# Patient Record
Sex: Male | Born: 1935 | Race: White | Hispanic: No | Marital: Married | Smoking: Former smoker
Health system: Southern US, Community
[De-identification: ages and names within clinical notes are randomized; demographics above are authoritative.]

## PROBLEM LIST (undated history)

## (undated) DIAGNOSIS — I803 Phlebitis and thrombophlebitis of lower extremities, unspecified: Secondary | ICD-10-CM

## (undated) DIAGNOSIS — I442 Atrioventricular block, complete: Secondary | ICD-10-CM

## (undated) DIAGNOSIS — Z86010 Personal history of colonic polyps: Secondary | ICD-10-CM

## (undated) DIAGNOSIS — N400 Enlarged prostate without lower urinary tract symptoms: Secondary | ICD-10-CM

## (undated) DIAGNOSIS — E785 Hyperlipidemia, unspecified: Secondary | ICD-10-CM

## (undated) DIAGNOSIS — I472 Ventricular tachycardia: Secondary | ICD-10-CM

## (undated) DIAGNOSIS — Z950 Presence of cardiac pacemaker: Secondary | ICD-10-CM

## (undated) DIAGNOSIS — I2510 Atherosclerotic heart disease of native coronary artery without angina pectoris: Secondary | ICD-10-CM

## (undated) DIAGNOSIS — M1990 Unspecified osteoarthritis, unspecified site: Secondary | ICD-10-CM

## (undated) DIAGNOSIS — T4145XA Adverse effect of unspecified anesthetic, initial encounter: Secondary | ICD-10-CM

## (undated) DIAGNOSIS — C801 Malignant (primary) neoplasm, unspecified: Secondary | ICD-10-CM

## (undated) DIAGNOSIS — R5381 Other malaise: Secondary | ICD-10-CM

## (undated) DIAGNOSIS — J189 Pneumonia, unspecified organism: Secondary | ICD-10-CM

## (undated) DIAGNOSIS — R55 Syncope and collapse: Secondary | ICD-10-CM

## (undated) DIAGNOSIS — Z8719 Personal history of other diseases of the digestive system: Secondary | ICD-10-CM

## (undated) DIAGNOSIS — M353 Polymyalgia rheumatica: Secondary | ICD-10-CM

## (undated) DIAGNOSIS — I10 Essential (primary) hypertension: Secondary | ICD-10-CM

## (undated) DIAGNOSIS — R5383 Other fatigue: Secondary | ICD-10-CM

## (undated) DIAGNOSIS — Z8679 Personal history of other diseases of the circulatory system: Secondary | ICD-10-CM

## (undated) DIAGNOSIS — K219 Gastro-esophageal reflux disease without esophagitis: Secondary | ICD-10-CM

## (undated) DIAGNOSIS — J449 Chronic obstructive pulmonary disease, unspecified: Secondary | ICD-10-CM

## (undated) HISTORY — PX: LUMBAR LAMINECTOMY: SHX95

## (undated) HISTORY — DX: Benign prostatic hyperplasia without lower urinary tract symptoms: N40.0

## (undated) HISTORY — DX: Personal history of colonic polyps: Z86.010

## (undated) HISTORY — DX: Personal history of other diseases of the circulatory system: Z86.79

## (undated) HISTORY — DX: Chronic obstructive pulmonary disease, unspecified: J44.9

## (undated) HISTORY — DX: Hyperlipidemia, unspecified: E78.5

## (undated) HISTORY — PX: BACK SURGERY: SHX140

## (undated) HISTORY — PX: CAROTID ENDARTERECTOMY: SUR193

## (undated) HISTORY — DX: Ventricular tachycardia: I47.2

## (undated) HISTORY — DX: Atherosclerotic heart disease of native coronary artery without angina pectoris: I25.10

## (undated) HISTORY — PX: CORONARY ANGIOPLASTY WITH STENT PLACEMENT: SHX49

## (undated) HISTORY — DX: Phlebitis and thrombophlebitis of lower extremities, unspecified: I80.3

## (undated) HISTORY — DX: Other malaise: R53.81

## (undated) HISTORY — DX: Gastro-esophageal reflux disease without esophagitis: K21.9

## (undated) HISTORY — PX: TRANSURETHRAL RESECTION OF PROSTATE: SHX73

## (undated) HISTORY — DX: Polymyalgia rheumatica: M35.3

## (undated) HISTORY — PX: CATARACT EXTRACTION W/ INTRAOCULAR LENS  IMPLANT, BILATERAL: SHX1307

## (undated) HISTORY — DX: Atrioventricular block, complete: I44.2

## (undated) HISTORY — DX: Syncope and collapse: R55

## (undated) HISTORY — DX: Presence of cardiac pacemaker: Z95.0

## (undated) HISTORY — DX: Personal history of other diseases of the digestive system: Z87.19

## (undated) HISTORY — DX: Other fatigue: R53.83

## (undated) HISTORY — DX: Essential (primary) hypertension: I10

---

## 2000-06-02 ENCOUNTER — Encounter: Payer: Self-pay | Admitting: Orthopedic Surgery

## 2000-06-02 ENCOUNTER — Encounter: Admission: RE | Admit: 2000-06-02 | Discharge: 2000-06-02 | Payer: Self-pay | Admitting: Orthopedic Surgery

## 2000-06-07 ENCOUNTER — Ambulatory Visit (HOSPITAL_BASED_OUTPATIENT_CLINIC_OR_DEPARTMENT_OTHER): Admission: RE | Admit: 2000-06-07 | Discharge: 2000-06-08 | Payer: Self-pay | Admitting: Orthopedic Surgery

## 2002-08-09 ENCOUNTER — Encounter: Payer: Self-pay | Admitting: Neurosurgery

## 2002-08-14 ENCOUNTER — Ambulatory Visit (HOSPITAL_COMMUNITY): Admission: RE | Admit: 2002-08-14 | Discharge: 2002-08-14 | Payer: Self-pay | Admitting: Neurosurgery

## 2002-08-14 ENCOUNTER — Encounter: Payer: Self-pay | Admitting: Neurosurgery

## 2004-03-24 ENCOUNTER — Ambulatory Visit: Payer: Self-pay | Admitting: Internal Medicine

## 2004-06-11 ENCOUNTER — Ambulatory Visit: Payer: Self-pay | Admitting: Internal Medicine

## 2004-08-13 ENCOUNTER — Ambulatory Visit: Payer: Self-pay | Admitting: Internal Medicine

## 2005-02-09 ENCOUNTER — Ambulatory Visit: Payer: Self-pay | Admitting: Internal Medicine

## 2005-08-17 ENCOUNTER — Ambulatory Visit: Payer: Self-pay | Admitting: Internal Medicine

## 2005-10-12 ENCOUNTER — Ambulatory Visit: Payer: Self-pay | Admitting: Internal Medicine

## 2006-01-14 ENCOUNTER — Ambulatory Visit: Payer: Self-pay | Admitting: Internal Medicine

## 2006-01-21 ENCOUNTER — Ambulatory Visit: Payer: Self-pay | Admitting: Internal Medicine

## 2006-07-19 ENCOUNTER — Ambulatory Visit: Payer: Self-pay | Admitting: Internal Medicine

## 2006-07-22 ENCOUNTER — Ambulatory Visit: Payer: Self-pay

## 2006-07-22 ENCOUNTER — Ambulatory Visit: Payer: Self-pay | Admitting: Cardiology

## 2006-08-02 ENCOUNTER — Ambulatory Visit: Payer: Self-pay | Admitting: Internal Medicine

## 2006-08-04 ENCOUNTER — Ambulatory Visit: Payer: Self-pay | Admitting: *Deleted

## 2006-08-05 ENCOUNTER — Encounter: Admission: RE | Admit: 2006-08-05 | Discharge: 2006-08-05 | Payer: Self-pay | Admitting: *Deleted

## 2006-08-09 ENCOUNTER — Encounter (INDEPENDENT_AMBULATORY_CARE_PROVIDER_SITE_OTHER): Payer: Self-pay | Admitting: *Deleted

## 2006-08-09 ENCOUNTER — Ambulatory Visit: Payer: Self-pay | Admitting: *Deleted

## 2006-08-09 ENCOUNTER — Encounter: Payer: Self-pay | Admitting: Internal Medicine

## 2006-08-09 ENCOUNTER — Inpatient Hospital Stay (HOSPITAL_COMMUNITY): Admission: RE | Admit: 2006-08-09 | Discharge: 2006-08-11 | Payer: Self-pay | Admitting: *Deleted

## 2006-08-09 DIAGNOSIS — N400 Enlarged prostate without lower urinary tract symptoms: Secondary | ICD-10-CM

## 2006-08-09 DIAGNOSIS — K219 Gastro-esophageal reflux disease without esophagitis: Secondary | ICD-10-CM

## 2006-08-09 DIAGNOSIS — M353 Polymyalgia rheumatica: Secondary | ICD-10-CM

## 2006-08-09 DIAGNOSIS — Z860100 Personal history of colon polyps, unspecified: Secondary | ICD-10-CM

## 2006-08-09 DIAGNOSIS — E785 Hyperlipidemia, unspecified: Secondary | ICD-10-CM

## 2006-08-09 DIAGNOSIS — Z8679 Personal history of other diseases of the circulatory system: Secondary | ICD-10-CM

## 2006-08-09 DIAGNOSIS — Z950 Presence of cardiac pacemaker: Secondary | ICD-10-CM

## 2006-08-09 DIAGNOSIS — J309 Allergic rhinitis, unspecified: Secondary | ICD-10-CM | POA: Insufficient documentation

## 2006-08-09 HISTORY — DX: Personal history of colonic polyps: Z86.010

## 2006-08-09 HISTORY — DX: Polymyalgia rheumatica: M35.3

## 2006-08-09 HISTORY — DX: Gastro-esophageal reflux disease without esophagitis: K21.9

## 2006-08-09 HISTORY — DX: Presence of cardiac pacemaker: Z95.0

## 2006-08-09 HISTORY — DX: Personal history of other diseases of the circulatory system: Z86.79

## 2006-08-09 HISTORY — DX: Benign prostatic hyperplasia without lower urinary tract symptoms: N40.0

## 2006-08-09 HISTORY — DX: Hyperlipidemia, unspecified: E78.5

## 2006-08-09 HISTORY — DX: Personal history of colon polyps, unspecified: Z86.0100

## 2006-08-25 ENCOUNTER — Ambulatory Visit: Payer: Self-pay | Admitting: *Deleted

## 2006-11-25 ENCOUNTER — Ambulatory Visit: Payer: Self-pay | Admitting: Internal Medicine

## 2006-11-25 DIAGNOSIS — I6990 Unspecified sequelae of unspecified cerebrovascular disease: Secondary | ICD-10-CM | POA: Insufficient documentation

## 2007-01-31 ENCOUNTER — Ambulatory Visit: Payer: Self-pay | Admitting: Internal Medicine

## 2007-02-16 ENCOUNTER — Encounter: Payer: Self-pay | Admitting: Internal Medicine

## 2007-02-16 ENCOUNTER — Ambulatory Visit: Payer: Self-pay | Admitting: *Deleted

## 2007-05-06 ENCOUNTER — Telehealth: Payer: Self-pay | Admitting: Internal Medicine

## 2007-05-06 ENCOUNTER — Ambulatory Visit: Payer: Self-pay | Admitting: Internal Medicine

## 2007-05-06 DIAGNOSIS — K5289 Other specified noninfective gastroenteritis and colitis: Secondary | ICD-10-CM

## 2007-05-22 ENCOUNTER — Ambulatory Visit: Payer: Self-pay | Admitting: Internal Medicine

## 2007-05-22 DIAGNOSIS — R413 Other amnesia: Secondary | ICD-10-CM | POA: Insufficient documentation

## 2007-06-08 ENCOUNTER — Ambulatory Visit: Payer: Self-pay | Admitting: Internal Medicine

## 2007-06-08 DIAGNOSIS — I803 Phlebitis and thrombophlebitis of lower extremities, unspecified: Secondary | ICD-10-CM

## 2007-06-08 HISTORY — DX: Phlebitis and thrombophlebitis of lower extremities, unspecified: I80.3

## 2007-06-09 ENCOUNTER — Telehealth: Payer: Self-pay | Admitting: Internal Medicine

## 2007-06-09 ENCOUNTER — Encounter: Payer: Self-pay | Admitting: Internal Medicine

## 2007-06-09 ENCOUNTER — Ambulatory Visit: Payer: Self-pay

## 2007-06-12 ENCOUNTER — Ambulatory Visit: Payer: Self-pay | Admitting: Internal Medicine

## 2007-06-26 ENCOUNTER — Ambulatory Visit: Payer: Self-pay | Admitting: Gastroenterology

## 2007-07-17 ENCOUNTER — Ambulatory Visit: Payer: Self-pay | Admitting: Gastroenterology

## 2007-07-17 ENCOUNTER — Encounter: Payer: Self-pay | Admitting: Gastroenterology

## 2007-07-17 ENCOUNTER — Encounter: Payer: Self-pay | Admitting: Internal Medicine

## 2007-07-19 ENCOUNTER — Encounter: Payer: Self-pay | Admitting: Gastroenterology

## 2007-08-17 ENCOUNTER — Ambulatory Visit: Payer: Self-pay | Admitting: *Deleted

## 2007-08-23 ENCOUNTER — Encounter: Payer: Self-pay | Admitting: Internal Medicine

## 2007-08-24 ENCOUNTER — Ambulatory Visit: Payer: Self-pay | Admitting: Internal Medicine

## 2007-09-01 LAB — CONVERTED CEMR LAB
Albumin: 4.1 g/dL (ref 3.5–5.2)
Basophils Absolute: 0 10*3/uL (ref 0.0–0.1)
Bilirubin, Direct: 0.1 mg/dL (ref 0.0–0.3)
CO2: 31 meq/L (ref 19–32)
Chloride: 103 meq/L (ref 96–112)
Creatinine, Ser: 1 mg/dL (ref 0.4–1.5)
Eosinophils Absolute: 0.3 10*3/uL (ref 0.0–0.7)
GFR calc Af Amer: 92 mL/min
HCT: 38.5 % — ABNORMAL LOW (ref 39.0–52.0)
Monocytes Relative: 4.7 % (ref 3.0–12.0)
Neutro Abs: 3.3 10*3/uL (ref 1.4–7.7)
Neutrophils Relative %: 65.5 % (ref 43.0–77.0)
Platelets: 163 10*3/uL (ref 150–400)
Potassium: 4.4 meq/L (ref 3.5–5.1)
RBC: 4.2 M/uL — ABNORMAL LOW (ref 4.22–5.81)
RDW: 12.8 % (ref 11.5–14.6)
TSH: 1.41 microintl units/mL (ref 0.35–5.50)
Total CHOL/HDL Ratio: 4.2
Total Protein: 7.4 g/dL (ref 6.0–8.3)
Triglycerides: 85 mg/dL (ref 0–149)

## 2007-12-26 ENCOUNTER — Ambulatory Visit: Payer: Self-pay | Admitting: Internal Medicine

## 2007-12-26 ENCOUNTER — Inpatient Hospital Stay (HOSPITAL_COMMUNITY): Admission: EM | Admit: 2007-12-26 | Discharge: 2007-12-30 | Payer: Self-pay | Admitting: Emergency Medicine

## 2007-12-27 ENCOUNTER — Encounter: Payer: Self-pay | Admitting: Internal Medicine

## 2007-12-28 ENCOUNTER — Encounter: Payer: Self-pay | Admitting: Internal Medicine

## 2007-12-30 ENCOUNTER — Encounter: Payer: Self-pay | Admitting: Internal Medicine

## 2008-01-03 ENCOUNTER — Ambulatory Visit: Payer: Self-pay | Admitting: Cardiology

## 2008-01-12 ENCOUNTER — Ambulatory Visit: Payer: Self-pay | Admitting: Internal Medicine

## 2008-01-12 DIAGNOSIS — I472 Ventricular tachycardia, unspecified: Secondary | ICD-10-CM | POA: Insufficient documentation

## 2008-01-12 DIAGNOSIS — I2510 Atherosclerotic heart disease of native coronary artery without angina pectoris: Secondary | ICD-10-CM

## 2008-01-12 DIAGNOSIS — I4729 Other ventricular tachycardia: Secondary | ICD-10-CM

## 2008-01-12 HISTORY — DX: Atherosclerotic heart disease of native coronary artery without angina pectoris: I25.10

## 2008-01-12 HISTORY — DX: Ventricular tachycardia: I47.2

## 2008-01-12 HISTORY — DX: Ventricular tachycardia, unspecified: I47.20

## 2008-01-12 HISTORY — DX: Other ventricular tachycardia: I47.29

## 2008-01-18 ENCOUNTER — Ambulatory Visit: Payer: Self-pay | Admitting: Cardiology

## 2008-01-25 ENCOUNTER — Ambulatory Visit: Payer: Self-pay | Admitting: Internal Medicine

## 2008-01-25 DIAGNOSIS — R42 Dizziness and giddiness: Secondary | ICD-10-CM

## 2008-01-25 LAB — CONVERTED CEMR LAB
Bilirubin Urine: NEGATIVE
Blood Glucose, Fingerstick: 134
Glucose, Urine, Semiquant: NEGATIVE
Ketones, urine, test strip: NEGATIVE
Protein, U semiquant: 100
Urobilinogen, UA: 0.2
pH: 5

## 2008-01-31 ENCOUNTER — Telehealth: Payer: Self-pay | Admitting: Internal Medicine

## 2008-02-01 ENCOUNTER — Ambulatory Visit: Payer: Self-pay | Admitting: Internal Medicine

## 2008-02-01 DIAGNOSIS — Z8719 Personal history of other diseases of the digestive system: Secondary | ICD-10-CM

## 2008-02-01 HISTORY — DX: Personal history of other diseases of the digestive system: Z87.19

## 2008-02-14 ENCOUNTER — Telehealth: Payer: Self-pay | Admitting: Internal Medicine

## 2008-02-22 ENCOUNTER — Ambulatory Visit: Payer: Self-pay | Admitting: Internal Medicine

## 2008-05-21 ENCOUNTER — Ambulatory Visit: Payer: Self-pay | Admitting: Internal Medicine

## 2008-08-13 DIAGNOSIS — I10 Essential (primary) hypertension: Secondary | ICD-10-CM

## 2008-08-13 HISTORY — DX: Essential (primary) hypertension: I10

## 2008-08-14 ENCOUNTER — Ambulatory Visit: Payer: Self-pay | Admitting: Cardiology

## 2008-08-22 ENCOUNTER — Ambulatory Visit: Payer: Self-pay | Admitting: *Deleted

## 2008-09-04 ENCOUNTER — Ambulatory Visit: Payer: Self-pay | Admitting: Internal Medicine

## 2008-09-04 LAB — CONVERTED CEMR LAB
ALT: 15 units/L (ref 0–53)
Albumin: 3.7 g/dL (ref 3.5–5.2)
Alkaline Phosphatase: 41 units/L (ref 39–117)
Bilirubin, Direct: 0 mg/dL (ref 0.0–0.3)
CO2: 30 meq/L (ref 19–32)
Eosinophils Absolute: 0.2 10*3/uL (ref 0.0–0.7)
Glucose, Bld: 104 mg/dL — ABNORMAL HIGH (ref 70–99)
LDL Cholesterol: 118 mg/dL — ABNORMAL HIGH (ref 0–99)
MCHC: 34.1 g/dL (ref 30.0–36.0)
MCV: 92.3 fL (ref 78.0–100.0)
Potassium: 4 meq/L (ref 3.5–5.1)
RBC: 3.99 M/uL — ABNORMAL LOW (ref 4.22–5.81)
Sodium: 145 meq/L (ref 135–145)
Total Bilirubin: 1.1 mg/dL (ref 0.3–1.2)
Total CHOL/HDL Ratio: 4
Total Protein: 6.6 g/dL (ref 6.0–8.3)
Triglycerides: 113 mg/dL (ref 0.0–149.0)
VLDL: 22.6 mg/dL (ref 0.0–40.0)

## 2008-09-09 ENCOUNTER — Telehealth: Payer: Self-pay | Admitting: Internal Medicine

## 2008-11-21 ENCOUNTER — Ambulatory Visit: Payer: Self-pay | Admitting: Family Medicine

## 2008-11-21 DIAGNOSIS — R5381 Other malaise: Secondary | ICD-10-CM

## 2008-11-21 DIAGNOSIS — D649 Anemia, unspecified: Secondary | ICD-10-CM

## 2008-11-21 DIAGNOSIS — R0602 Shortness of breath: Secondary | ICD-10-CM | POA: Insufficient documentation

## 2008-11-21 DIAGNOSIS — I442 Atrioventricular block, complete: Secondary | ICD-10-CM

## 2008-11-21 DIAGNOSIS — R5383 Other fatigue: Secondary | ICD-10-CM | POA: Insufficient documentation

## 2008-11-21 HISTORY — DX: Atrioventricular block, complete: I44.2

## 2008-11-21 HISTORY — DX: Other malaise: R53.81

## 2008-11-22 LAB — CONVERTED CEMR LAB
Basophils Absolute: 0 10*3/uL (ref 0.0–0.1)
Eosinophils Absolute: 0.1 10*3/uL (ref 0.0–0.7)
Eosinophils Relative: 1.8 % (ref 0.0–5.0)
HCT: 38.9 % — ABNORMAL LOW (ref 39.0–52.0)
Hemoglobin: 13.1 g/dL (ref 13.0–17.0)
Lymphs Abs: 1.3 10*3/uL (ref 0.7–4.0)
Monocytes Relative: 5.6 % (ref 3.0–12.0)
Neutrophils Relative %: 72.4 % (ref 43.0–77.0)
RBC: 4.19 M/uL — ABNORMAL LOW (ref 4.22–5.81)

## 2008-11-27 ENCOUNTER — Ambulatory Visit: Payer: Self-pay | Admitting: Internal Medicine

## 2008-12-02 LAB — CONVERTED CEMR LAB
BUN: 11 mg/dL (ref 6–23)
Calcium: 8.7 mg/dL (ref 8.4–10.5)
Chloride: 107 meq/L (ref 96–112)
Creatinine, Ser: 0.9 mg/dL (ref 0.4–1.5)
GFR calc non Af Amer: 85.93 mL/min (ref >60)
Potassium: 4.3 meq/L (ref 3.5–5.1)

## 2008-12-03 ENCOUNTER — Telehealth (INDEPENDENT_AMBULATORY_CARE_PROVIDER_SITE_OTHER): Payer: Self-pay | Admitting: *Deleted

## 2008-12-04 ENCOUNTER — Ambulatory Visit: Payer: Self-pay

## 2008-12-04 ENCOUNTER — Encounter: Payer: Self-pay | Admitting: Cardiovascular Disease

## 2008-12-23 ENCOUNTER — Ambulatory Visit: Payer: Self-pay | Admitting: Internal Medicine

## 2008-12-23 DIAGNOSIS — F17200 Nicotine dependence, unspecified, uncomplicated: Secondary | ICD-10-CM | POA: Insufficient documentation

## 2008-12-25 DIAGNOSIS — R55 Syncope and collapse: Secondary | ICD-10-CM

## 2008-12-25 HISTORY — DX: Syncope and collapse: R55

## 2008-12-26 ENCOUNTER — Ambulatory Visit: Payer: Self-pay | Admitting: Cardiology

## 2009-01-13 ENCOUNTER — Ambulatory Visit: Payer: Self-pay | Admitting: Internal Medicine

## 2009-01-13 DIAGNOSIS — J069 Acute upper respiratory infection, unspecified: Secondary | ICD-10-CM | POA: Insufficient documentation

## 2009-01-13 DIAGNOSIS — J4489 Other specified chronic obstructive pulmonary disease: Secondary | ICD-10-CM

## 2009-01-13 DIAGNOSIS — J449 Chronic obstructive pulmonary disease, unspecified: Secondary | ICD-10-CM

## 2009-01-13 HISTORY — DX: Chronic obstructive pulmonary disease, unspecified: J44.9

## 2009-01-13 HISTORY — DX: Other specified chronic obstructive pulmonary disease: J44.89

## 2009-05-14 ENCOUNTER — Ambulatory Visit: Payer: Self-pay | Admitting: Internal Medicine

## 2009-08-29 ENCOUNTER — Encounter: Payer: Self-pay | Admitting: Internal Medicine

## 2009-08-29 ENCOUNTER — Ambulatory Visit: Payer: Self-pay | Admitting: Vascular Surgery

## 2009-09-09 ENCOUNTER — Ambulatory Visit: Payer: Self-pay | Admitting: Internal Medicine

## 2009-09-09 LAB — CONVERTED CEMR LAB
Alkaline Phosphatase: 47 units/L (ref 39–117)
BUN: 9 mg/dL (ref 6–23)
Basophils Relative: 0.8 % (ref 0.0–3.0)
Calcium: 9.1 mg/dL (ref 8.4–10.5)
Cholesterol: 209 mg/dL — ABNORMAL HIGH (ref 0–200)
Creatinine, Ser: 0.8 mg/dL (ref 0.4–1.5)
Eosinophils Absolute: 0 10*3/uL (ref 0.0–0.7)
Eosinophils Relative: 0.4 % (ref 0.0–5.0)
Glucose, Bld: 107 mg/dL — ABNORMAL HIGH (ref 70–99)
Neutro Abs: 4.7 10*3/uL (ref 1.4–7.7)
Neutrophils Relative %: 78.1 % — ABNORMAL HIGH (ref 43.0–77.0)
TSH: 1.59 microintl units/mL (ref 0.35–5.50)
Total Bilirubin: 0.6 mg/dL (ref 0.3–1.2)
Total Protein: 6.8 g/dL (ref 6.0–8.3)
Triglycerides: 112 mg/dL (ref 0.0–149.0)
WBC: 6 10*3/uL (ref 4.5–10.5)

## 2009-12-09 ENCOUNTER — Ambulatory Visit: Payer: Self-pay | Admitting: Internal Medicine

## 2009-12-09 ENCOUNTER — Encounter: Payer: Self-pay | Admitting: Cardiology

## 2009-12-09 ENCOUNTER — Encounter: Payer: Self-pay | Admitting: Internal Medicine

## 2009-12-18 ENCOUNTER — Ambulatory Visit: Payer: Self-pay | Admitting: Cardiology

## 2009-12-22 ENCOUNTER — Telehealth (INDEPENDENT_AMBULATORY_CARE_PROVIDER_SITE_OTHER): Payer: Self-pay | Admitting: Radiology

## 2009-12-23 ENCOUNTER — Ambulatory Visit: Payer: Self-pay

## 2009-12-23 ENCOUNTER — Encounter: Payer: Self-pay | Admitting: Internal Medicine

## 2009-12-23 ENCOUNTER — Encounter (HOSPITAL_COMMUNITY)
Admission: RE | Admit: 2009-12-23 | Discharge: 2010-01-09 | Payer: Self-pay | Source: Home / Self Care | Admitting: Cardiology

## 2009-12-23 ENCOUNTER — Ambulatory Visit: Payer: Self-pay | Admitting: Internal Medicine

## 2009-12-24 LAB — CONVERTED CEMR LAB
BUN: 10 mg/dL (ref 6–23)
Basophils Absolute: 0.1 10*3/uL (ref 0.0–0.1)
Basophils Relative: 1.2 % (ref 0.0–3.0)
Calcium: 9.1 mg/dL (ref 8.4–10.5)
Chloride: 103 meq/L (ref 96–112)
Eosinophils Relative: 2.7 % (ref 0.0–5.0)
GFR calc non Af Amer: 76.78 mL/min (ref >60)
HCT: 37.9 % — ABNORMAL LOW (ref 39.0–52.0)
Lymphocytes Relative: 29.7 % (ref 12.0–46.0)
Lymphs Abs: 1.6 10*3/uL (ref 0.7–4.0)
MCV: 93.1 fL (ref 78.0–100.0)
Monocytes Relative: 9.3 % (ref 3.0–12.0)
Platelets: 173 10*3/uL (ref 150.0–400.0)
Potassium: 4.2 meq/L (ref 3.5–5.1)
RBC: 4.07 M/uL — ABNORMAL LOW (ref 4.22–5.81)
TSH: 1.08 microintl units/mL (ref 0.35–5.50)
WBC: 5.5 10*3/uL (ref 4.5–10.5)

## 2010-01-06 ENCOUNTER — Ambulatory Visit: Payer: Self-pay | Admitting: Internal Medicine

## 2010-02-12 ENCOUNTER — Ambulatory Visit: Payer: Self-pay | Admitting: Internal Medicine

## 2010-02-12 ENCOUNTER — Encounter: Payer: Self-pay | Admitting: Internal Medicine

## 2010-02-13 ENCOUNTER — Telehealth: Payer: Self-pay | Admitting: Internal Medicine

## 2010-04-07 ENCOUNTER — Ambulatory Visit
Admission: RE | Admit: 2010-04-07 | Discharge: 2010-04-07 | Payer: Self-pay | Source: Home / Self Care | Attending: Internal Medicine | Admitting: Internal Medicine

## 2010-04-07 ENCOUNTER — Ambulatory Visit: Admission: RE | Admit: 2010-04-07 | Payer: Self-pay | Source: Home / Self Care | Admitting: Internal Medicine

## 2010-04-07 ENCOUNTER — Encounter: Payer: Self-pay | Admitting: Internal Medicine

## 2010-04-16 NOTE — Assessment & Plan Note (Signed)
Summary: 3 MNTH ROV//SLM   Vital Signs:  Patient profile:   77 year old male Weight:      193 pounds Temp:     98.1 degrees F oral BP sitting:   120 / 74  (left arm) Cuff size:   regular  Vitals Entered By: Duard Brady LPN (April 07, 2010 11:28 AM) CC: 3 mos rov - doing well Is Patient Diabetic? No   Primary Care Provider:  [name]  MD  CC:  3 mos rov - doing well.  History of Present Illness: 77 year old patient who is in today for follow-up.  He has a history of mild dementia, coronary artery disease, as well as cerebrovascular disease.  He was seen earlier for syncope, which has not recurred.  He has a history of ventricular tachycardia.  He did have a Cardiolite stress test done late last year.  That was low risk.  He is treated hypertension, which has been stable off medication  Allergies: 1)  ! Lipitor 2)  Demerol (Meperidine Hcl)  Past History:  Past Medical History: Reviewed history from 01/13/2009 and no changes required. GERD Benign prostatic hypertrophy Cerebrovascular accident, hx of right brain stroke in 2008 polymyalgia rheumatica Allergic rhinitis COPD Colonic polyps, hx of Hyperlipidemia memory loss Coronary artery disease  1. Recent admission for a syncopal episode possibly related to     ischemic arrhythmia. 2. Coronary artery disease status post stenting of a lesion in the     marginal branch of circumflex artery using an endeavor drug-eluting     stent. 3. Good left ventricular function. 4. Nonsustained ventricular tachycardia in the hospital. 5. Hypertension. 6. Hyperlipidemia. 7. Chronic obstructive pulmonary disease. 8. Dementia.   Past Surgical History: Cataract extraction Lumbar laminectomy Transurethral resection of prostate status post heart catheterization and stenting of the circumflex artery, October 2009 R CEA carotid artery Doppler ultrasound August 17, 2007, August 22, 2008 colonoscopy Jul 17, 2007 Cardiolite   stress test September 2011  Review of Systems  The patient denies anorexia, fever, weight loss, weight gain, vision loss, decreased hearing, hoarseness, chest pain, syncope, dyspnea on exertion, peripheral edema, prolonged cough, headaches, hemoptysis, abdominal pain, melena, hematochezia, severe indigestion/heartburn, hematuria, incontinence, genital sores, muscle weakness, suspicious skin lesions, transient blindness, difficulty walking, depression, unusual weight change, abnormal bleeding, enlarged lymph nodes, angioedema, breast masses, and testicular masses.    Physical Exam  General:  Well-developed,well-nourished,in no acute distress; alert,appropriate and cooperative throughout examination; blood pressure is 120/80 Head:  Normocephalic and atraumatic without obvious abnormalities. No apparent alopecia or balding. Eyes:  No corneal or conjunctival inflammation noted. EOMI. Perrla. Funduscopic exam benign, without hemorrhages, exudates or papilledema. Vision grossly normal. Mouth:  Oral mucosa and oropharynx without lesions or exudates.  Teeth in good repair. Neck:  No deformities, masses, or tenderness noted. Lungs:  Normal respiratory effort, chest expands symmetrically.  few bibasilar crackles Heart:  Normal rate and regular rhythm. S1 and S2 normal without gallop, murmur, click, rub or other extra sounds. Abdomen:  Bowel sounds positive,abdomen soft and non-tender without masses, organomegaly or hernias noted. Extremities:  No clubbing, cyanosis, edema, or deformity noted with normal full range of motion of all joints.     Impression & Recommendations:  Problem # 1:  SYNCOPE (ICD-780.2) this has not recurred, will clinically observe  Problem # 2:  HYPERTENSION, BENIGN (ICD-401.1)  Problem # 3:  CORONARY ARTERY DISEASE (ICD-414.00)  His updated medication list for this problem includes:    Aspirin Ec 325  Mg Tbec (Aspirin) .Marland Kitchen... Take one tablet by mouth daily    Plavix 75 Mg  Tabs (Clopidogrel bisulfate) .Marland Kitchen... 1 once daily  Complete Medication List: 1)  Aspirin Ec 325 Mg Tbec (Aspirin) .... Take one tablet by mouth daily 2)  Valium 5 Mg Tabs (Diazepam) .... Take 1 tablet by mouth as needed 3)  Zantac 150 Mg Tabs (Ranitidine hcl) .... Take 1 tablet by mouth  at bedtime 4)  Pravastatin Sodium 20 Mg Tabs (Pravastatin sodium) .... One daily 5)  Aricept 5 Mg Tabs (Donepezil hcl) .... One daily 6)  Plavix 75 Mg Tabs (Clopidogrel bisulfate) .Marland Kitchen.. 1 once daily  Patient Instructions: 1)  Please schedule a follow-up appointment in 4 months. 2)  Limit your Sodium (Salt). 3)  It is important that you exercise regularly at least 20 minutes 5 times a week. If you develop chest pain, have severe difficulty breathing, or feel very tired , stop exercising immediately and seek medical attention.   Orders Added: 1)  Est. Patient Level III [16109]

## 2010-04-16 NOTE — Assessment & Plan Note (Signed)
Summary: f1y  Medications Added ASPIRIN EC 325 MG TBEC (ASPIRIN) Take one tablet by mouth daily      Allergies Added:   Visit Type:  Follow-up Primary Provider:  [name]  MD  CC:  No complains.  History of Present Illness: [name] is 77 years old and return for management of CAD. In 2009 he had a syncopal episode with documented ventricular tachycardia and underwent PCI of the circumflex artery with a drug-eluting stent. He had done well since that time over the past 2 years. However one week ago while he was in the bathroom he got sweaty and suddenly blacked out. He was only out for a few seconds and his wife found him. She wanted to call 911 but he did not. He saw Dr. Kirtland [name]. after that and he tapered his carvedilol with plans to discontinue it. His ECG at that time was normal.  Current Medications (verified): 1)  Aspirin Ec 325 Mg Tbec (Aspirin) .... Take One Tablet By Mouth Daily 2)  Valium 5 Mg Tabs (Diazepam) .... Take 1 Tablet By Mouth As Needed 3)  Zantac 150 Mg Tabs (Ranitidine Hcl) .... Take 1 Tablet By Mouth  At Bedtime 4)  Pravastatin Sodium 20 Mg  Tabs (Pravastatin Sodium) .... One Daily 5)  Aricept 5 Mg  Tabs (Donepezil Hcl) .... One Daily 6)  Plavix 75 Mg Tabs (Clopidogrel Bisulfate) .Marland Kitchen.. 1 Once Daily 7)  Carvedilol 6.25 Mg Tabs (Carvedilol) .... Take One-Half  Tablet By Mouth Twice A Day  Allergies (verified): 1)  ! Lipitor 2)  Demerol (Meperidine Hcl)  Past History:  Past Medical History: Reviewed history from 01/13/2009 and no changes required. GERD Benign prostatic hypertrophy Cerebrovascular accident, hx of right brain stroke in 2008 polymyalgia rheumatica Allergic rhinitis COPD Colonic polyps, hx of Hyperlipidemia memory loss Coronary artery disease  1. Recent admission for a syncopal episode possibly related to     ischemic arrhythmia. 2. Coronary artery disease status post stenting of a lesion in the     marginal branch of circumflex  artery using an endeavor drug-eluting     stent. 3. Good left ventricular function. 4. Nonsustained ventricular tachycardia in the hospital. 5. Hypertension. 6. Hyperlipidemia. 7. Chronic obstructive pulmonary disease. 8. Dementia.   Review of Systems       ROS is negative except as outlined in HPI.   Vital Signs:  Patient profile:   77 year old male Height:      69 inches Weight:      190 pounds BMI:     28.16 Pulse rate:   64 / minute Pulse rhythm:   regular Resp:     18 per minute BP sitting:   131 / 80  (left arm) Cuff size:   regular  Vitals Entered By: [name] Kanaris, CNA (December 18, 2009 11:34 AM)  Physical Exam  Additional Exam:  Gen. Well-nourished, in no distress   Neck: No JVD, thyroid not enlarged, no carotid bruits Lungs: No tachypnea, clear without rales, rhonchi or wheezes Cardiovascular: Rhythm regular, PMI not displaced,  heart sounds  normal, no murmurs or gallops, no peripheral edema, pulses normal in all 4 extremities. Abdomen: BS normal, abdomen soft and non-tender without masses or organomegaly, no hepatosplenomegaly. MS: No deformities, no cyanosis or clubbing   Neuro:  No focal sns   Skin:  no lesions    Impression & Recommendations:  Problem # 1:  SYNCOPE (ICD-780.2) He had a syncopal episode a week ago and  the etiology is not clear. This may have been a vasovagal syncope but syncope was his initial presentation before with ventricular tachycardia and ischemic heart disease. This was managed with stenting of the circumflex artery only. I agree with Dr. Charm [name] decision to taper and DC the Coreg. We will plan to get a Myoview scan to evaluate him for the possibility of ischemia. We will also get laboratory studies including a CBC BMP and TSH. If this workup is negative then would just follow him and see him back in a year. His updated medication list for this problem includes:    Aspirin Ec 325 Mg Tbec (Aspirin) .Marland Kitchen... Take one tablet by mouth daily     Plavix 75 Mg Tabs (Clopidogrel bisulfate) .Marland Kitchen... 1 once daily    Carvedilol 6.25 Mg Tabs (Carvedilol) .Marland Kitchen... Take one-half  tablet by mouth twice a day  Problem # 2:  CORONARY ARTERY DISEASE (ICD-414.00) He had a stent to the circumflex artery in 2009 which was a DES. He's had no chest pain but we plan further evaluation as outlined above. His updated medication list for this problem includes:    Aspirin Ec 325 Mg Tbec (Aspirin) .Marland Kitchen... Take one tablet by mouth daily    Plavix 75 Mg Tabs (Clopidogrel bisulfate) .Marland Kitchen... 1 once daily    Carvedilol 6.25 Mg Tabs (Carvedilol) .Marland Kitchen... Take one-half  tablet by mouth twice a day  Orders: TLB-BMP (Basic Metabolic Panel-BMET) (80048-METABOL) TLB-CBC Platelet - w/Differential (85025-CBCD) TLB-TSH (Thyroid Stimulating Hormone) (84443-TSH) Nuclear Stress Test (Nuc Stress Test)  Problem # 3:  HYPERTENSION, BENIGN (ICD-401.1) This is well controlled on current medications. His updated medication list for this problem includes:    Aspirin Ec 325 Mg Tbec (Aspirin) .Marland Kitchen... Take one tablet by mouth daily    Carvedilol 6.25 Mg Tabs (Carvedilol) .Marland Kitchen... Take one-half  tablet by mouth twice a day  Patient Instructions: 1)  Your physician recommends that you have lab work today: bmet/cbc/tsh (414.01) 2)  Your physician recommends that you continue on your current medications as directed. Please refer to the Current Medication list given to you today. 3)  Your physician wants you to follow-up in:  1 year with Dr. Clifton [name]. You will receive a reminder letter in the mail two months in advance. If you don't receive a letter, please call our office to schedule the follow-up appointment. 4)  Your physician has requested that you have a Lexiscan myoview.  For further information please visit [email or web address]  Please follow instruction sheet, as given.

## 2010-04-16 NOTE — Letter (Signed)
Summary: [facility]  [facility]   Imported By: Maryln Gottron 09/12/2009 13:13:12  _____________________________________________________________________  External Attachment:    Type:   Image     Comment:   External Document

## 2010-04-16 NOTE — Progress Notes (Signed)
Summary: Nuc Pre-Procedure  Phone Note Outgoing Call Call back at Home Phone [phone]   Call placed by: Cleon Gustin Call placed to: Patient Reason for Call: Confirm/change Appt Summary of Call: Reviewed information on Myoview Information Sheet (see scanned document for further details).  Spoke with pt's wife.     Nuclear Med Background Indications for Stress Test: Evaluation for Ischemia, Stent Patency   History: COPD, Echo, Heart Catheterization, Myocardial Perfusion Study, Stents  History Comments: 12/04/08- MPS- Nl. EF= 67% 12/29/07 Heart Cath: NL LVF, 90% CFX>Stent Echo: EF=55-65%  Symptoms: Diaphoresis, Syncope    Nuclear Pre-Procedure Cardiac Risk Factors: Carotid Disease, CVA, Family History - CAD, Hypertension, Lipids Height (in): 69  Nuclear Med Study Referring MD:  B. Juanda Chance

## 2010-04-16 NOTE — Assessment & Plan Note (Signed)
Summary: passed out this morning per wife//ccm   Vital Signs:  Patient profile:   77 year old male Weight:      192 pounds Temp:     98.2 degrees F oral BP sitting:   112 / 80  (right arm) Cuff size:   regular  Vitals Entered By: Duard Brady LPN (December 09, 2009 9:10 AM) CC: c/o LOC this AM ,fell in bathroom Is Patient Diabetic? No CBG Result 198 Flu Vaccine Consent Questions     Do you have a history of severe allergic reactions to this vaccine? no    Any prior history of allergic reactions to egg and/or gelatin? no    Do you have a sensitivity to the preservative Thimersol? no    Do you have a past history of Guillan-Barre Syndrome? no    Do you currently have an acute febrile illness? no    Have you ever had a severe reaction to latex? no    Vaccine information given and explained to patient? yes    Are you currently pregnant? no    Lot Number:[id number]   Exp Date:09/12/2010   Site Given  Left Deltoid IM   Primary Care Provider:  [name]  MD  CC:  c/o LOC this AM  and fell in bathroom.  History of Present Illness: 77 year old patient who is seen today following a syncopal episode.  He has had a prior episode in the past.  He is status post right carotid endarterectomy in May of 2008 and is status post stenting in October of 2009.  He states that he has had some occasional dizzy spells and slight diaphoresis since his stent in 2009.  Apparently, he has been on carvedilol since that time.  The patient was in the bathroom and his wife heard him fall and he was responsive when she got to the bathroom.  The patient has no knowledge of the episode and does not recall any presyncopal symptoms.  There was no seizure activity or incontinence noted. he has a history of dural, vascular disease, prior history of syncope and also polymyalgia rheumatica.  He currently is off prednisone therapy.  Laboratory studies were checked and the summer at the time of his physical 3  months ago  Allergies: 1)  ! Lipitor 2)  Demerol (Meperidine Hcl)  Past History:  Past Medical History: Reviewed history from 01/13/2009 and no changes required. GERD Benign prostatic hypertrophy Cerebrovascular accident, hx of right brain stroke in 2008 polymyalgia rheumatica Allergic rhinitis COPD Colonic polyps, hx of Hyperlipidemia memory loss Coronary artery disease  1. Recent admission for a syncopal episode possibly related to     ischemic arrhythmia. 2. Coronary artery disease status post stenting of a lesion in the     marginal branch of circumflex artery using an endeavor drug-eluting     stent. 3. Good left ventricular function. 4. Nonsustained ventricular tachycardia in the hospital. 5. Hypertension. 6. Hyperlipidemia. 7. Chronic obstructive pulmonary disease. 8. Dementia.   Family History: Reviewed history from 09/09/2009 and no changes required. father died age 33, prostate cancer mother died age 45  Two brothers and 5 sisters two sisters deceased from complications of diabetes  one brother died unclear causes  Review of Systems  The patient denies anorexia, fever, weight loss, weight gain, vision loss, decreased hearing, hoarseness, chest pain, syncope, dyspnea on exertion, peripheral edema, prolonged cough, headaches, hemoptysis, abdominal pain, melena, hematochezia, severe indigestion/heartburn, hematuria, incontinence, genital sores, muscle weakness, suspicious skin  lesions, transient blindness, difficulty walking, depression, unusual weight change, abnormal bleeding, enlarged lymph nodes, angioedema, breast masses, and testicular masses.    Physical Exam  General:  Well-developed,well-nourished,in no acute distress; alert,appropriate and cooperative throughout examination; 120/80 in both arms Head:  atraumatic Eyes:  No corneal or conjunctival inflammation noted. EOMI. Perrla. Funduscopic exam benign, without hemorrhages, exudates or papilledema.  Vision grossly normal. Ears:  External ear exam shows no significant lesions or deformities.  Otoscopic examination reveals clear canals, tympanic membranes are intact bilaterally without bulging, retraction, inflammation or discharge. Hearing is grossly normal bilaterally. Mouth:  Oral mucosa and oropharynx without lesions or exudates.  Teeth in good repair. Neck:  right carotid endarterectomy scar left carotid upstroke and appeared slightly diminished Lungs:  Normal respiratory effort, chest expands symmetrically. Lungs are clear to auscultation, no crackles or wheezes. Heart:  Normal rate and regular rhythm. S1 and S2 normal without gallop, murmur, click, rub or other extra sounds. Abdomen:  Bowel sounds positive,abdomen soft and non-tender without masses, organomegaly or hernias noted. Msk:  No deformity or scoliosis noted of thoracic or lumbar spine.   Neurologic:  alert & oriented X3, cranial nerves II-XII intact, gait normal, and finger-to-nose normal.  alert & oriented X3, cranial nerves II-XII intact, gait normal, and finger-to-nose normal.     Impression & Recommendations:  Problem # 1:  SYNCOPE (ICD-780.2) will taper and discontinue carvedilol, over the next two weeks  Problem # 2:  COPD (ICD-496)  Problem # 3:  HYPERTENSION, BENIGN (ICD-401.1)  His updated medication list for this problem includes:    Carvedilol 6.25 Mg Tabs (Carvedilol) .Marland Kitchen... Take one-half  tablet by mouth twice a day  His updated medication list for this problem includes:    Carvedilol 6.25 Mg Tabs (Carvedilol) .Marland Kitchen... Take one-half  tablet by mouth twice a day  Problem # 4:  CEREBROVASCULAR ACCIDENT, HX OF (ICD-V12.50)  Complete Medication List: 1)  Baby Aspirin 325 Mg Chew (aspirin)  .... One daily 2)  Valium 5 Mg Tabs (Diazepam) .... Take 1 tablet by mouth as needed 3)  Zantac 150 Mg Tabs (Ranitidine hcl) .... Take 1 tablet by mouth  at bedtime 4)  Pravastatin Sodium 20 Mg Tabs (Pravastatin sodium)  .... One daily 5)  Aricept 5 Mg Tabs (Donepezil hcl) .... One daily 6)  Plavix 75 Mg Tabs (Clopidogrel bisulfate) .Marland Kitchen.. 1 once daily 7)  Carvedilol 6.25 Mg Tabs (Carvedilol) .... Take one-half  tablet by mouth twice a day 8)  Multivitamins Tabs (Multiple vitamin)  Other Orders: Capillary Blood Glucose/CBG (16109) Flu Vaccine 64yrs + MEDICARE PATIENTS (U0454) Administration Flu vaccine - MCR (U9811)  Patient Instructions: 1)  Please schedule a follow-up appointment in 1 month. 2)  Limit your Sodium (Salt) to less than 2 grams a day(slightly less than 1/2 a teaspoon) to prevent fluid retention, swelling, or worsening of symptoms.

## 2010-04-16 NOTE — Assessment & Plan Note (Signed)
Summary: 2 episodes of weakness/will not go to the ER/dm   Vital Signs:  Patient profile:   77 year old male Weight:      193 pounds Temp:     98.2 degrees F oral BP sitting:   110 / 70  (left arm) Cuff size:   regular  Vitals Entered By: [name] Brady LPN (February 12, 2010 1:48 PM) CC: while at eating out with wife - became unresponsive x2 -??blackout?? EMS call - refused transport Is Patient Diabetic? No   Primary Care Provider:  [name]  MD  CC:  while at eating out with wife - became unresponsive x2 -??blackout?? EMS call - refused transport.  History of Present Illness:  77 year old patient who has a history of coronary artery disease, as well as carotid artery disease and a history of syncope in the past.  He has been hospitalized and noted to have nonsustained ventricular tachycardia.  Approximately 6 weeks ago.  He had a normal nuclear medicine stress test as well as laboratory studies.  this morning he awoke with some intermittent nausea.  He was noted by his wife to become a slightly pale and weak when he was a passenger  in his car.  His weakness and paleness quickly  resolved, and he and his wife spent the morning shopping.  While at lunch earlier today.  He  again became weak, pale, and slumped over with his chin on his chest.  There was no complete loss of consciousness and he was responsive.  Symptoms resolve fairly quickly at the time he was sitting eating his lunch.  He denied any chest pain or shortness of breath.  He had brief paleness, but no diaphoresis.  At the present time, the patient feels well  Preventive Screening-Counseling & Management  Alcohol-Tobacco     Smoking Status: current  Allergies: 1)  ! Lipitor 2)  Demerol (Meperidine Hcl)  Past History:  Past Medical History: Reviewed history from 01/13/2009 and no changes required. GERD Benign prostatic hypertrophy Cerebrovascular accident, hx of right brain stroke in 2008 polymyalgia  rheumatica Allergic rhinitis COPD Colonic polyps, hx of Hyperlipidemia memory loss Coronary artery disease  1. Recent admission for a syncopal episode possibly related to     ischemic arrhythmia. 2. Coronary artery disease status post stenting of a lesion in the     marginal branch of circumflex artery using an endeavor drug-eluting     stent. 3. Good left ventricular function. 4. Nonsustained ventricular tachycardia in the hospital. 5. Hypertension. 6. Hyperlipidemia. 7. Chronic obstructive pulmonary disease. 8. Dementia.   Family History: Reviewed history from 09/09/2009 and no changes required. father died age 20, prostate cancer mother died age 70  Two brothers and 5 sisters two sisters deceased from complications of diabetes  one brother died unclear causes  Review of Systems       The patient complains of muscle weakness.  The patient denies anorexia, fever, weight loss, weight gain, vision loss, decreased hearing, hoarseness, chest pain, syncope, dyspnea on exertion, peripheral edema, prolonged cough, headaches, hemoptysis, abdominal pain, melena, hematochezia, severe indigestion/heartburn, hematuria, incontinence, genital sores, suspicious skin lesions, transient blindness, difficulty walking, depression, unusual weight change, abnormal bleeding, enlarged lymph nodes, angioedema, breast masses, and testicular masses.    Physical Exam  General:  Well-developed,well-nourished,in no acute distress; alert,appropriate and cooperative throughout examination Head:  Normocephalic and atraumatic without obvious abnormalities. No apparent alopecia or balding. Eyes:  No corneal or conjunctival inflammation noted. EOMI. Perrla. Funduscopic  exam benign, without hemorrhages, exudates or papilledema. Vision grossly normal. Mouth:  Oral mucosa and oropharynx without lesions or exudates.  Teeth in good repair. Neck:  No deformities, masses, or tenderness noted. Lungs:  bibasilar  rales O2 saturation 97% Heart:  Normal rate and regular rhythm. S1 and S2 normal without gallop, murmur, click, rub or other extra sounds. Abdomen:  Bowel sounds positive,abdomen soft and non-tender without masses, organomegaly or hernias noted. Msk:  No deformity or scoliosis noted of thoracic or lumbar spine.   Pulses:  R and L carotid,radial,femoral,dorsalis pedis and posterior tibial pulses are full and equal bilaterally   Impression & Recommendations:  Problem # 1:  SYNCOPE (ICD-780.2)  Orders: EKG w/ Interpretation (93000) patient had two near-syncopal episodes.  It sounds neurocardiogenic; doubt that this is related to primary arrhythmia such as nonsustained ventricular tachycardia.  He has had a recent normal nuclear medicine stress test and EKG today is normal  Problem # 2:  COPD (ICD-496)  Problem # 3:  HYPERTENSION, BENIGN (ICD-401.1) presently stable, off medication  Problem # 4:  WEAKNESS (ICD-780.79)  Orders: EKG w/ Interpretation (93000)  Complete Medication List: 1)  Aspirin Ec 325 Mg Tbec (Aspirin) .... Take one tablet by mouth daily 2)  Valium 5 Mg Tabs (Diazepam) .... Take 1 tablet by mouth as needed 3)  Zantac 150 Mg Tabs (Ranitidine hcl) .... Take 1 tablet by mouth  at bedtime 4)  Pravastatin Sodium 20 Mg Tabs (Pravastatin sodium) .... One daily 5)  Aricept 5 Mg Tabs (Donepezil hcl) .... One daily 6)  Plavix 75 Mg Tabs (Clopidogrel bisulfate) .Marland Kitchen.. 1 once daily  Patient Instructions: 1)  Drink as much fluid as you can tolerate for the next few days. 2)  call if there  are  any further episodes of weakness 3)  Please schedule a follow-up appointment as needed.   Orders Added: 1)  EKG w/ Interpretation [93000] 2)  Est. Patient Level IV [60454]

## 2010-04-16 NOTE — Assessment & Plan Note (Signed)
Summary: Cardiology Nuclear Testing  Nuclear Med Background Indications for Stress Test: Evaluation for Ischemia, Stent Patency   History: COPD, Echo, Heart Catheterization, Myocardial Perfusion Study, Stents  History Comments: 12/04/08- MPS- Nl. EF= 67% 12/29/07 Heart Cath: NL LVF, 90% CFX>Stent Echo: EF=55-65%  Symptoms: Diaphoresis, Syncope    Nuclear Pre-Procedure Cardiac Risk Factors: Carotid Disease, CVA, Family History - CAD, Hypertension, Lipids Caffeine/Decaff Intake: NONE NPO After: 8:30 AM Lungs: clear IV 0.9% NS with Angio Cath: 22g     IV Site: R Hand IV Started by: Doyne Keel, CNMT Chest Size (in) 40     Height (in): 69 Weight (lb): 188 BMI: 27.86 Tech Comments: HELD CARVEDILOL THIS AM  Nuclear Med Study 1 or 2 day study:  1 day     Stress Test Type:  Eugenie Birks Reading MD:  Arvilla Meres, MD     Referring MD:  B. Brodie Resting Radionuclide:  Technetium 84m Tetrofosmin     Resting Radionuclide Dose:  11.0 mCi  Stress Radionuclide:  Technetium 8m Tetrofosmin     Stress Radionuclide Dose:  33.0 mCi   Stress Protocol      Max HR:  89 bpm     Predicted Max HR:  138 bpm  Max Systolic BP: 144 mm Hg     Percent Max HR:  64.49 %Rate Pressure Product:  [address]109  Lexiscan: 0.4 mg   Stress Test Technologist:  Cathlyn Parsons, RN     Nuclear Technologist:  Doyne Keel, CNMT  Rest Procedure  Myocardial perfusion imaging was performed at rest 45 minutes following the intravenous administration of Technetium 7m Tetrofosmin.  Stress Procedure  The patient received IV Lexiscan 0.4 mg over 15-seconds.  Technetium 57m Tetrofosmin injected at 30-seconds.  There were no significant changes with infusion. Patient at in recovery of dizziness,became pale and diaphoretic.  Patient placed in supine position and color improved with in a min.  Patient O2sat never dropped below 96%.  Patient had no ectopy with Lexiscan.  Patient had occas. PAC's and rare PVC's.   Quantitative  spect images were obtained after a 45 minute delay.  QPS Raw Data Images:  Normal; no motion artifact; normal heart/lung ratio. Stress Images:  Normal homogeneous uptake in all areas of the myocardium. Rest Images:  Normal homogeneous uptake in all areas of the myocardium. Subtraction (SDS):  Normal Transient Ischemic Dilatation:  1.02  (Normal <1.22)  Lung/Heart Ratio:  .30  (Normal <0.45)  Quantitative Gated Spect Images QGS EDV:  83 ml QGS ESV:  35 ml QGS EF:  58 % QGS cine images:  Normal  Findings Normal nuclear study      Overall Impression  Exercise Capacity: Lexiscan with no exercise. ECG Impression: No significant ST segment change suggestive of ischemia. Overall Impression: Normal stress nuclear study.  Appended Document: Cardiology Nuclear Testing The pt is aware of his results.

## 2010-04-16 NOTE — Progress Notes (Signed)
Summary: Update on symptoms  Phone Note Call from Patient   Caller: Spouse Call For: [name] Savers  MD Summary of Call: Wife wanted to report pt had another episode around noon yesterday and again in the evening.  She did lie him down, pulse 64, BP 149/90. Today so far he has been fine. FYI Initial call taken by: Sid Falcon LPN,  February 13, 2010 2:12 PM  Follow-up for Phone Call        called Follow-up by: [name] Savers  MD,  February 16, 2010 10:42 AM

## 2010-04-16 NOTE — Assessment & Plan Note (Signed)
Summary: 4 month rov/njr   Vital Signs:  Patient profile:   77 year old male Height:      69 inches Weight:      190 pounds BMI:     28.16 Temp:     98.1 degrees F oral Pulse rate:   68 / minute Resp:     14 per minute BP sitting:   144 / 80  (left arm)  Vitals Entered By: Willy Eddy, LPN (January 06, 2010 11:22 AM) CC: roa Is Patient Diabetic? No   Primary Care Provider:  [name]  MD  CC:  roa.  History of Present Illness: 77 year old patient who is seen today for follow-up.  He has had no further syncope since his last visit here.  He has been seen by cardiology and has had a negative nuclear medicine stress test.  He feels well.  He has treated hypertension, osteoarthritis.  It had no further ankle episodes nor any dizziness, weakness, or lightheadedness.  Coreg  has been tapered and discontinued.  Preventive Screening-Counseling & Management  Alcohol-Tobacco     Smoking Status: current     Smoking Cessation Counseling: yes  Current Problems (verified): 1)  Preventive Health Care  (ICD-V70.0) 2)  Uri  (ICD-465.9) 3)  COPD  (ICD-496) 4)  Syncope  (ICD-780.2) 5)  Tobacco Use, Quit  (ICD-V15.82) 6)  Dyspnea  (ICD-786.05) 7)  Anemia, Mild  (ICD-285.9) 8)  Weakness  (ICD-780.79) 9)  Hypertension, Benign  (ICD-401.1) 10)  Cerebrovascular Accident, Hx of  (ICD-V12.50) 11)  Coronary Artery Disease  (ICD-414.00) 12)  Ventricular Tachycardia  (ICD-427.1) 13)  Hyperlipidemia  (ICD-272.4) 14)  Melena, Hx of  (ICD-V12.79) 15)  Dizziness  (ICD-780.4) 16)  Special Screening Malignant Neoplasm of Prostate  (ICD-V76.44) 17)  Phlebitis, Lower Extremity  (ICD-451.2) 18)  Memory Loss  (ICD-780.93) 19)  Gastroenteritis  (ICD-558.9) 20)  Cerebrovascular Disease Late Effects, Nos  (ICD-438.9) 21)  Polymyalgia Rheumatica  (ICD-725) 22)  Colonic Polyps, Hx of  (ICD-V12.72) 23)  Allergic Rhinitis  (ICD-477.9) 24)  Benign Prostatic Hypertrophy  (ICD-600.00) 25)   Gerd  (ICD-530.81)  Current Medications (verified): 1)  Aspirin Ec 325 Mg Tbec (Aspirin) .... Take One Tablet By Mouth Daily 2)  Valium 5 Mg Tabs (Diazepam) .... Take 1 Tablet By Mouth As Needed 3)  Zantac 150 Mg Tabs (Ranitidine Hcl) .... Take 1 Tablet By Mouth  At Bedtime 4)  Pravastatin Sodium 20 Mg  Tabs (Pravastatin Sodium) .... One Daily 5)  Aricept 5 Mg  Tabs (Donepezil Hcl) .... One Daily 6)  Plavix 75 Mg Tabs (Clopidogrel Bisulfate) .Marland Kitchen.. 1 Once Daily  Allergies (verified): 1)  ! Lipitor 2)  Demerol (Meperidine Hcl)  Past History:  Past Medical History: Reviewed history from 01/13/2009 and no changes required. GERD Benign prostatic hypertrophy Cerebrovascular accident, hx of right brain stroke in 2008 polymyalgia rheumatica Allergic rhinitis COPD Colonic polyps, hx of Hyperlipidemia memory loss Coronary artery disease  1. Recent admission for a syncopal episode possibly related to     ischemic arrhythmia. 2. Coronary artery disease status post stenting of a lesion in the     marginal branch of circumflex artery using an endeavor drug-eluting     stent. 3. Good left ventricular function. 4. Nonsustained ventricular tachycardia in the hospital. 5. Hypertension. 6. Hyperlipidemia. 7. Chronic obstructive pulmonary disease. 8. Dementia.   Review of Systems  The patient denies anorexia, fever, weight loss, weight gain, vision loss, decreased hearing, hoarseness, chest pain, syncope,  dyspnea on exertion, peripheral edema, prolonged cough, headaches, hemoptysis, abdominal pain, melena, hematochezia, severe indigestion/heartburn, hematuria, incontinence, genital sores, muscle weakness, suspicious skin lesions, transient blindness, difficulty walking, depression, unusual weight change, abnormal bleeding, enlarged lymph nodes, angioedema, breast masses, and testicular masses.    Physical Exam  General:  Well-developed,well-nourished,in no acute distress; alert,appropriate  and cooperative throughout examination Head:  Normocephalic and atraumatic without obvious abnormalities. No apparent alopecia or balding. Eyes:  No corneal or conjunctival inflammation noted. EOMI. Perrla. Funduscopic exam benign, without hemorrhages, exudates or papilledema. Vision grossly normal. Mouth:  Oral mucosa and oropharynx without lesions or exudates.  Teeth in good repair. Neck:  No deformities, masses, or tenderness noted. Lungs:  Normal respiratory effort, chest expands symmetrically. Lungs are clear to auscultation, no crackles or wheezes. Heart:  Normal rate and regular rhythm. S1 and S2 normal without gallop, murmur, click, rub or other extra sounds. Abdomen:  Bowel sounds positive,abdomen soft and non-tender without masses, organomegaly or hernias noted.   Impression & Recommendations:  Problem # 1:  SYNCOPE (ICD-780.2) probable vasovagal although orthostatic hypotension difficult to exclude.  Will maintain off Coreg unless blood pressure trends up.  He has had a normal nuclear  medicine stress test, but a history of syncope, secondary to ventricular tachycardia  Problem # 2:  HYPERTENSION, BENIGN (ICD-401.1)  The following medications were removed from the medication list:    Carvedilol 6.25 Mg Tabs (Carvedilol) .Marland Kitchen... Take one-half  tablet by mouth twice a day  Complete Medication List: 1)  Aspirin Ec 325 Mg Tbec (Aspirin) .... Take one tablet by mouth daily 2)  Valium 5 Mg Tabs (Diazepam) .... Take 1 tablet by mouth as needed 3)  Zantac 150 Mg Tabs (Ranitidine hcl) .... Take 1 tablet by mouth  at bedtime 4)  Pravastatin Sodium 20 Mg Tabs (Pravastatin sodium) .... One daily 5)  Aricept 5 Mg Tabs (Donepezil hcl) .... One daily 6)  Plavix 75 Mg Tabs (Clopidogrel bisulfate) .Marland Kitchen.. 1 once daily  Patient Instructions: 1)  Please schedule a follow-up appointment in 3 months. 2)  Limit your Sodium (Salt). 3)  It is important that you exercise regularly at least 20 minutes 5  times a week. If you develop chest pain, have severe difficulty breathing, or feel very tired , stop exercising immediately and seek medical attention.   Orders Added: 1)  Est. Patient Level III [16109]

## 2010-04-16 NOTE — Assessment & Plan Note (Signed)
Summary: 4 MONTH ROV/NRJ----PTS WIFE [facility] // [facility]   Vital Signs:  Patient profile:   77 year old male Weight:      191 pounds Temp:     97.9 degrees F oral BP sitting:   160 / 100  (right arm) Cuff size:   regular  Vitals Entered By: Duard Brady LPN (May 14, 452 8:04 AM) CC: 4 mos rov / no problems Is Patient Diabetic? No   Primary Care Provider:  [name]  MD  CC:  4 mos rov / no problems.  History of Present Illness: 27  old who is seen today for follow-up.  He has hypertension, coronary artery disease, and COPD.  He is treated dyslipidemia.  He had cardiology follow-up.  Last fall that included a nuclear stress test.  He remained stable although recovering from a mild URI.  No other concerns or complaints today.  Laboratory update was obtained in June of last year.  He denies any exertional chest pain or shortness of breath.  He has a history of Gastrosoft reflux disease well controlled with Zantac; medical regimen includes Plavix  Preventive Screening-Counseling & Management  Alcohol-Tobacco     Smoking Status: current  Allergies: 1)  ! Lipitor 2)  Demerol (Meperidine Hcl)  Past History:  Past Medical History: Reviewed history from 01/13/2009 and no changes required. GERD Benign prostatic hypertrophy Cerebrovascular accident, hx of right brain stroke in 2008 polymyalgia rheumatica Allergic rhinitis COPD Colonic polyps, hx of Hyperlipidemia memory loss Coronary artery disease  1. Recent admission for a syncopal episode possibly related to     ischemic arrhythmia. 2. Coronary artery disease status post stenting of a lesion in the     marginal branch of circumflex artery using an endeavor drug-eluting     stent. 3. Good left ventricular function. 4. Nonsustained ventricular tachycardia in the hospital. 5. Hypertension. 6. Hyperlipidemia. 7. Chronic obstructive pulmonary disease. 8. Dementia.   Review of Systems  The patient denies  anorexia, fever, weight loss, weight gain, vision loss, decreased hearing, hoarseness, chest pain, syncope, dyspnea on exertion, peripheral edema, prolonged cough, headaches, hemoptysis, abdominal pain, melena, hematochezia, severe indigestion/heartburn, hematuria, incontinence, genital sores, muscle weakness, suspicious skin lesions, transient blindness, difficulty walking, depression, unusual weight change, abnormal bleeding, enlarged lymph nodes, angioedema, breast masses, and testicular masses.    Physical Exam  General:  Well-developed,well-nourished,in no acute distress; alert,appropriate and cooperative throughout examination; 140/84 Head:  Normocephalic and atraumatic without obvious abnormalities. No apparent alopecia or balding. Eyes:  No corneal or conjunctival inflammation noted. EOMI. Perrla. Funduscopic exam benign, without hemorrhages, exudates or papilledema. Vision grossly normal. Mouth:  Oral mucosa and oropharynx without lesions or exudates.  Teeth in good repair. Neck:  No deformities, masses, or tenderness noted. Lungs:  Normal respiratory effort, chest expands symmetrically. Lungs are clear to auscultation, no crackles or wheezes. Heart:  Normal rate and regular rhythm. S1 and S2 normal without gallop, murmur, click, rub or other extra sounds. Abdomen:  Bowel sounds positive,abdomen soft and non-tender without masses, organomegaly or hernias noted. Msk:  No deformity or scoliosis noted of thoracic or lumbar spine.   Pulses:  R and L carotid,radial,femoral,dorsalis pedis and posterior tibial pulses are full and equal bilaterally Extremities:  No clubbing, cyanosis, edema, or deformity noted with normal full range of motion of all joints.     Impression & Recommendations:  Problem # 1:  COPD (ICD-496)  Problem # 2:  HYPERTENSION, BENIGN (ICD-401.1)  His updated medication list  for this problem includes:    Carvedilol 6.25 Mg Tabs (Carvedilol) .Marland Kitchen... Take one tablet by mouth  twice a day  His updated medication list for this problem includes:    Carvedilol 6.25 Mg Tabs (Carvedilol) .Marland Kitchen... Take one tablet by mouth twice a day  Problem # 3:  CORONARY ARTERY DISEASE (ICD-414.00)  His updated medication list for this problem includes:    Plavix 75 Mg Tabs (Clopidogrel bisulfate) .Marland Kitchen... 1 once daily    Carvedilol 6.25 Mg Tabs (Carvedilol) .Marland Kitchen... Take one tablet by mouth twice a day  His updated medication list for this problem includes:    Plavix 75 Mg Tabs (Clopidogrel bisulfate) .Marland Kitchen... 1 once daily    Carvedilol 6.25 Mg Tabs (Carvedilol) .Marland Kitchen... Take one tablet by mouth twice a day  Problem # 4:  HYPERLIPIDEMIA (ICD-272.4)  His updated medication list for this problem includes:    Pravastatin Sodium 20 Mg Tabs (Pravastatin sodium) ..... One daily  His updated medication list for this problem includes:    Pravastatin Sodium 20 Mg Tabs (Pravastatin sodium) ..... One daily  Complete Medication List: 1)  Baby Aspirin 325 Mg Chew (aspirin)  .... One daily 2)  Valium 5 Mg Tabs (Diazepam) .... Take 1 tablet by mouth as needed 3)  Zantac 150 Mg Tabs (Ranitidine hcl) .... Take 1 tablet by mouth  at bedtime 4)  Pravastatin Sodium 20 Mg Tabs (Pravastatin sodium) .... One daily 5)  Aricept 5 Mg Tabs (Donepezil hcl) .... One daily 6)  Plavix 75 Mg Tabs (Clopidogrel bisulfate) .Marland Kitchen.. 1 once daily 7)  Carvedilol 6.25 Mg Tabs (Carvedilol) .... Take one tablet by mouth twice a day 8)  Multivitamins Tabs (Multiple vitamin)  Patient Instructions: 1)  Please schedule a follow-up appointment in 4 months. 2)  Advised not to eat any food or drink any liquids after 10 PM the night before your procedure. 3)  Limit your Sodium (Salt). 4)  It is important that you exercise regularly at least 20 minutes 5 times a week. If you develop chest pain, have severe difficulty breathing, or feel very tired , stop exercising immediately and seek medical attention.

## 2010-04-16 NOTE — Assessment & Plan Note (Signed)
Summary: CPX, PT WILL COME FASTING//SLM   Vital Signs:  Patient profile:   77 year old male Height:      69 inches Weight:      192 pounds Temp:     98.2 degrees F oral BP sitting:   132 / 80  Vitals Entered By: Duard Brady LPN (September 09, 2009 9:23 AM) CC: cpx- fasting, Hypertension Management Is Patient Diabetic? No   Primary Care Provider:  [name]  MD  CC:  cpx- fasting and Hypertension Management.  History of Present Illness: an 77 year old patient who is seen today for a conference and evaluation.  Medical problems include coronary artery disease, as well as cerebrovascular disease.  He has dyslipidemia, history of polymyalgia rheumatica, dementia, gastroesophageal reflux disease.  He denies any focal neurological or any cardiopulmonary complaints. Here for Medicare AWV:  1.   Risk factors based on Past M, S, F history:  patient has known coronary artery disease 2.   Physical Activities: no major activity restrictions 3.   Depression/mood: no history of depression.  Does have moderate cognitive impairment 4.   Hearing: no deficits 5.   ADL's: independent in all aspects of daily living 6.   Fall Risk: mild to moderate due to age and arthritis 7.   Home Safety: no problems identified 8.   Height, weight, &visual acuity:height and weight stable.  No change in visual acuity 9.   Counseling: heart healthy diet.  Encouraged 10.   Labs ordered based on risk factors: laboratory profile, including lipid profile will be reviewed 11.           Referral Coordination-  will continue follow-up with cardiology 12.           Care Plan- heart healthy diet.  An active lifestyle encouraged 13.            Cognitive Assessment- moderate impairment-  will continue on Aricept   Hypertension History:      Positive major cardiovascular risk factors include male age 30 years old or older, hyperlipidemia, hypertension, and current tobacco user.        Positive history for target  organ damage include ASHD (either angina/prior MI/prior CABG) and prior stroke (or TIA).     Allergies: 1)  ! Lipitor 2)  Demerol (Meperidine Hcl)  Past History:  Past Medical History: Reviewed history from 01/13/2009 and no changes required. GERD Benign prostatic hypertrophy Cerebrovascular accident, hx of right brain stroke in 2008 polymyalgia rheumatica Allergic rhinitis COPD Colonic polyps, hx of Hyperlipidemia memory loss Coronary artery disease  1. Recent admission for a syncopal episode possibly related to     ischemic arrhythmia. 2. Coronary artery disease status post stenting of a lesion in the     marginal branch of circumflex artery using an endeavor drug-eluting     stent. 3. Good left ventricular function. 4. Nonsustained ventricular tachycardia in the hospital. 5. Hypertension. 6. Hyperlipidemia. 7. Chronic obstructive pulmonary disease. 8. Dementia.   Past Surgical History: Cataract extraction Lumbar laminectomy Transurethral resection of prostate status post heart catheterization and stenting of the circumflex artery, October 2009 R CEA carotid artery Doppler ultrasound August 17, 2007, August 22, 2008 colonoscopy Jul 17, 2007 Cardiolite  stress test September 2010  Family History: Reviewed history from 01/25/2008 and no changes required. father died age 52, prostate cancer mother died age 53  Two brothers and 5 sisters two sisters deceased from complications of diabetes  one brother died unclear causes  Social History:  Reviewed history from 09/04/2008 and no changes required. Retired Married  Regular exercise-no remote tobacco  Review of Systems  The patient denies anorexia, fever, weight loss, weight gain, vision loss, decreased hearing, hoarseness, chest pain, syncope, dyspnea on exertion, peripheral edema, prolonged cough, headaches, hemoptysis, abdominal pain, melena, hematochezia, severe indigestion/heartburn, hematuria, incontinence,  genital sores, muscle weakness, suspicious skin lesions, transient blindness, difficulty walking, depression, unusual weight change, abnormal bleeding, enlarged lymph nodes, angioedema, breast masses, and testicular masses.    Physical Exam  General:  elderly, alert, no distress.  Blood pressure well controlled 126/70 Head:  Normocephalic and atraumatic without obvious abnormalities. No apparent alopecia or balding. Eyes:  No corneal or conjunctival inflammation noted. EOMI. Perrla. Funduscopic exam benign, without hemorrhages, exudates or papilledema. Vision grossly normal. Ears:  External ear exam shows no significant lesions or deformities.  Otoscopic examination reveals clear canals, tympanic membranes are intact bilaterally without bulging, retraction, inflammation or discharge. Hearing is grossly normal bilaterally. Mouth:  Oral mucosa and oropharynx without lesions or exudates.  dentures in place Neck:  No deformities, masses, or tenderness noted. Chest Wall:  No deformities, masses, tenderness or gynecomastia noted. Breasts:  No masses or gynecomastia noted Lungs:  Normal respiratory effort, chest expands symmetrically. Lungs are clear to auscultation, no crackles or wheezes. Heart:  Normal rate and regular rhythm. S1 and S2 normal without gallop, murmur, click, rub or other extra sounds. Abdomen:  Bowel sounds positive,abdomen soft and non-tender without masses, organomegaly or hernias noted. Rectal:  No external abnormalities noted. Normal sphincter tone. No rectal masses or tenderness. Genitalia:  Testes bilaterally descended without nodularity, tenderness or masses. No scrotal masses or lesions. No penis lesions or urethral discharge. Prostate:  Prostate gland firm and smooth, no enlargement, nodularity, tenderness, mass, asymmetry or induration. Msk:  No deformity or scoliosis noted of thoracic or lumbar spine.   Pulses:  R and L carotid,radial,femoral,dorsalis pedis and posterior  tibial pulses are full and equal bilaterally Extremities:  No clubbing, cyanosis, edema, or deformity noted with normal full range of motion of all joints.   Neurologic:  alert & oriented X3, cranial nerves II-XII intact, strength normal in all extremities, sensation intact to light touch, finger-to-nose normal, and heel-to-shin normal.  alert & oriented X3, cranial nerves II-XII intact, strength normal in all extremities, sensation intact to light touch, finger-to-nose normal, and heel-to-shin normal.   Skin:  Intact without suspicious lesions or rashes Cervical Nodes:  No lymphadenopathy noted Axillary Nodes:  No palpable lymphadenopathy Inguinal Nodes:  No significant adenopathy Psych:  Cognition and judgment appear intact. Alert and cooperative with normal attention span and concentration. No apparent delusions, illusions, hallucinations   Impression & Recommendations:  Problem # 1:  Preventive Health Care (ICD-V70.0)  Orders: First annual wellness visit with prevention plan  (Z6109)  Complete Medication List: 1)  Baby Aspirin 325 Mg Chew (aspirin)  .... One daily 2)  Valium 5 Mg Tabs (Diazepam) .... Take 1 tablet by mouth as needed 3)  Zantac 150 Mg Tabs (Ranitidine hcl) .... Take 1 tablet by mouth  at bedtime 4)  Pravastatin Sodium 20 Mg Tabs (Pravastatin sodium) .... One daily 5)  Aricept 5 Mg Tabs (Donepezil hcl) .... One daily 6)  Plavix 75 Mg Tabs (Clopidogrel bisulfate) .Marland Kitchen.. 1 once daily 7)  Carvedilol 6.25 Mg Tabs (Carvedilol) .... Take one tablet by mouth twice a day 8)  Multivitamins Tabs (Multiple vitamin)  Other Orders: Venipuncture (60454) TLB-Lipid Panel (80061-LIPID) TLB-BMP (Basic Metabolic Panel-BMET) (80048-METABOL) TLB-CBC Platelet -  w/Differential (85025-CBCD) TLB-Hepatic/Liver Function Pnl (80076-HEPATIC) TLB-TSH (Thyroid Stimulating Hormone) [phone]) Prescription Created Electronically [phone])  Hypertension Assessment/Plan:      The patient's  hypertensive risk group is category C: Target organ damage and/or diabetes.  Today's blood pressure is 132/80.    Patient Instructions: 1)  Please schedule a follow-up appointment in 4 months. 2)  Limit your Sodium (Salt). 3)  It is important that you exercise regularly at least 20 minutes 5 times a week. If you develop chest pain, have severe difficulty breathing, or feel very tired , stop exercising immediately and seek medical attention. Prescriptions: CARVEDILOL 6.25 MG TABS (CARVEDILOL) Take one tablet by mouth twice a day  #180 x 6   Entered and Authorized by:   [name]  MD   Signed by:   [name]  MD on 09/09/2009   Method used:   Electronically to        [facility] (retail)       [address]       [place], [place]  [address]       Ph: [phone]       Fax: [phone]   RxID:   [id number] PLAVIX 75 MG TABS (CLOPIDOGREL BISULFATE) 1 once daily  #90 x 6   Entered and Authorized by:   [name]  MD   Signed by:   [name]  MD on 09/09/2009   Method used:   Electronically to        [facility] (retail)       [address]       [place], [place]  [address]       Ph: [phone]       Fax: [phone]   RxID:   [id number] ARICEPT 5 MG  TABS (DONEPEZIL HCL) one daily  #90 Each x 5   Entered and Authorized by:   [name]  MD   Signed by:   [name]  MD on 09/09/2009   Method used:   Electronically to        [facility] (retail)       [address]       [place], [place]  [address]       Ph: [phone]       Fax: [phone]   RxID:   [id number] PRAVASTATIN SODIUM 20 MG  TABS (PRAVASTATIN SODIUM) one daily  #90 x 6   Entered and Authorized by:   [name]  MD   Signed by:   [name]  MD on 09/09/2009   Method used:   Electronically to        [facility] (retail)        [address]       [place], [place]  [address]       Ph: [phone]       Fax: [phone]   RxID:   [id number]

## 2010-07-22 ENCOUNTER — Other Ambulatory Visit (INDEPENDENT_AMBULATORY_CARE_PROVIDER_SITE_OTHER): Payer: Medicare Other

## 2010-07-22 DIAGNOSIS — Z48812 Encounter for surgical aftercare following surgery on the circulatory system: Secondary | ICD-10-CM

## 2010-07-22 DIAGNOSIS — I6529 Occlusion and stenosis of unspecified carotid artery: Secondary | ICD-10-CM

## 2010-07-28 NOTE — Procedures (Signed)
CAROTID DUPLEX EXAM   INDICATION:  Right carotid endarterectomy.   HISTORY:  Diabetes:  No.  Cardiac:  Stents.  Hypertension:  Yes.  Smoking:  Yes.  Previous Surgery:  Right carotid endarterectomy on 08/09/06.  CV History:  Asymptomatic.  Amaurosis Fugax No, Paresthesias No, Hemiparesis No.                                       RIGHT             LEFT  Brachial systolic pressure:         130               130  Brachial Doppler waveforms:         Normal            Normal  Vertebral direction of flow:        Antegrade         Antegrade  DUPLEX VELOCITIES (cm/sec)  CCA peak systolic                   67                64  ECA peak systolic                   76                70  ICA peak systolic                   85                86  ICA end diastolic                   28                32  PLAQUE MORPHOLOGY:                                    Mixed  PLAQUE AMOUNT:                      None              Mild  PLAQUE LOCATION:                                      ICA, ECA   IMPRESSION:  1. Patent right carotid endarterectomy site with no evidence of      stenosis in the right internal carotid artery.  2. 1% to 39% stenosis in the left internal carotid artery.  3. Antegrade flow in bilateral vertebral arteries.   ___________________________________________  Larina Earthly, M.D.   NT/MEDQ  D:  08/29/2009  T:  08/29/2009  Job:  [id number]

## 2010-07-28 NOTE — Discharge Summary (Signed)
[name] [name], [name] [name]                 ACCOUNT NO.:  [id number]   MEDICAL RECORD NO.:  [id number]          PATIENT TYPE:  INP   LOCATION:  6524                         FACILITY:  [facility]   PHYSICIAN:  Corwin Levins, MD      DATE OF BIRTH:  06/13/1925   DATE OF ADMISSION:  12/26/2007  DATE OF DISCHARGE:  12/30/2007                               DISCHARGE SUMMARY   DISCHARGE DIAGNOSES:  1. Coronary artery disease.  2. Status post catheterization with stent placement this admission.  3. Abnormal stress Myoview prior to catheterization.  4. 2-D echocardiogram with diastolic dysfunction, ejection fraction      55%-65%.  5. Nonsustained ventricular tachycardia, apparently asymptomatic.  6. Glucose intolerance, mild.  7. History of peripheral vascular disease, primarily carotid.  8. Dyslipidemia.  9. Mild leukopenia.  10.Mild thrombocytopenia.  11.Dementia.  12.Gastroesophageal reflux disease.  13.Chronic insomnia.  14.Chronic obstructive pulmonary disease.  15.Hypertension.  16.History of stroke.  17.Status post right carotid endarterectomy.   PROCEDURES:  1. 2-D echocardiogram with ejection fraction 55%-65% and diastolic      dysfunction noted.  2. Telemetry with nonsustained VT.  3. Abnormal Myoview December 28, 2007.  4. Cardiac catheterization on December 29, 2007, with stent placement      to the circumflex, 90% lesion apparently reduced to 0%.  5. CT of the head negative for acute abnormalities.   CONSULTATIONS:  Cardiology.   HISTORY AND PHYSICAL:  See the dictated admission.   HOSPITAL COURSE:  [name] [name] is an 77 year old white male with history  of peripheral vascular disease and prior stroke, who had sudden loss of  consciousness, eating out at the fast food establishment.  He was  admitted, apparently not found to be orthostatic.  A 2-D echocardiogram  with results as above.  Nonsustained VT noted on telemetry.  Cardiology  consultation obtained per Dr. Graciela Husbands, who felt  the abrupt nature of the  syncope suggested a bradycardic event, but otherwise not supported by  normal ECG.  Although there could be the possibility of sinus node  dysfunction.  The nonsustained VT was felt to be concerning, but at the  point of initial consultation, it was unknown if there was ischemia or  not.  Myoview was obtained, which was found to have a reversible defect  and anteroseptal perfusion.  Cardiac catheterization performed with  circumflex lesion found, stent placed reducing the 90% lesion to 0%.  Post catheterization, it was felt syncope could be related to ischemic  arrhythmia from the circumflex lesion, but that it was far certain.  The  plan and recommendation at that point per Dr. Juanda Chance was to discharge  with plans for outpatient followup, with LifeWatch monitor overnight.  On December 29, 2007, there was 14 beats of V-TACH noted.  The patient  was asymptomatic.  Vital signs stable.  He was seen by Dr. Eden Emms on the  day of discharge, felt to be stable.  The addition of Coreg 3.125 mg  b.i.d. was begun.  Other recommendations for post catheterization,  aspirin and Plavix for 1 year unless the patient  goes on research  protocol.  [facility] Cardiology office would call for the patient to  come to their office on January 01, 2008, to pick up the LifeWatch  monitor for recent syncope.  The patient is not to drive.  The patient  will also follow up for post cath appointment with Dr. Juanda Chance.  As the  patient was otherwise ambulatory, eating well, no further problems  identified, it was felt that he had gained maximal benefit from this  hospitalization and is to be discharged home.  There was noted through  his hospitalization some mild random hyperglycemia, slight leukopenia,  mild thrombocytopenia, which were of no clinical significance during  this admission.   DISPOSITION:  Discharged home in good condition.  There are no activity  or dietary restrictions except  for no driving as above.  Cardiovascular  followup will be as above.  He is to follow with Dr. Amador Cunas in 1-2  weeks.   DISCHARGE MEDICATIONS:  1. Coreg 3.125 mg p.o. b.i.d.  2. Plavix 75 mg p.o. daily.  3. Aspirin 325 mg one p.o. daily.   All other medications on admission to include:  1. Lipitor 40 mg p.o. daily.  2. Aricept 5 mg p.o. daily.  3. Zantac 150 mg p.o. daily.  4. Valium 5 mg nightly p.r.n.      Corwin Levins, MD  Electronically Signed     JWJ/MEDQ  D:  12/30/2007  T:  12/30/2007  Job:  [id number]   cc:   [name], MD  Everardo Beals. Juanda Chance, MD, [facility]

## 2010-07-28 NOTE — Assessment & Plan Note (Signed)
 HEALTHCARE                         GASTROENTEROLOGY OFFICE NOTE   [name] [name], [name] [name]                        MRN:          [id number]  DATE:06/26/2007                            DOB:          10/06/1925    [name] [name] has a history of multiple hyperplastic colon polyps and has  previously undergone colonoscopies by Dr. Kinnie Scales starting in 1987. A  subsequent colonoscopy in January 1999 revealed adenomatous colon  polyps. His most recent colonoscopy was in August 2004 which showed 14  colon polyps measuring between 3 and 8mm with a mixture of adenomatous  and hyperplastic colon polyps. He is followed by Dr. Amador Cunas, and he  is status post cerebrovascular accident in May 2008. His hyperlipidemia  is controlled on diet and Pravastatin. He is maintained on Prevacid and  ranitidine for management of chronic GERD, and symptoms appear to be  under good control on this regimen. He relates no dysphagia,  odynophagia, nausea, vomiting, weight loss, melena, hematochezia, change  in bowel habits, change in stool caliber, diarrhea, or constipation.   PAST MEDICAL HISTORY:  1. Adenomatous and hyperplastic colon polyps.  2. GERD.  3. Hyperlipidemia.  4. Status post cerebrovascular accident in May 2008.  5. Arthritis.   PAST SURGICAL HISTORY:  1. Status post decompressive lumbar laminectomy and lumbar      microdiskectomy in 2004.  2. Status post carotid endarterectomy.  3. Status post skin cancers removed from his nose and back.  4. Status post oral surgery.   CURRENT MEDICATIONS:  Listed on the chart, updated and reviewed.   MEDICATION ALLERGIES:  DEMEROL, ARICEPT leading to nightmares and  insomnia.   SOCIAL HISTORY:  Per the handwritten form.   REVIEW OF SYSTEMS:  Per the handwritten form.   PHYSICAL EXAMINATION:  GENERAL:  Well-developed, well-nourished white  male in no acute distress.  VITAL SIGNS:  Height 6 feet; weight 197.4 pounds. Blood  pressure is  130/84, pulse 72 and regular.  HEENT:  Anicteric sclerae. Oropharynx is clear.  CHEST:  Clear to auscultation bilaterally.  CARDIAC:  Regular rate and rhythm without murmurs appreciated.  ABDOMEN:  Soft, nontender, nondistended. Normoactive bowel sounds. No  palpable organomegaly, masses, or hernias.  RECTAL:  Deferred to colonoscopy.  EXTREMITIES:  Without clubbing, cyanosis, or edema.  NEUROLOGIC:  Alert and oriented x3. Grossly nonfocal.   ASSESSMENT AND PLAN:  Personal history of adenomatous colon polyps and  multiple hyperplastic colon polyps. His overall health status is good.  He wishes to proceed with surveillance colonoscopy. Risks, benefits, and  alternatives to colonoscopy with possible biopsy and possible  polypectomy discussed with the patient, and he consents to proceed. This  will be scheduled electively. If he has a few small adenomatous colon  polyps or less on this examination, I would consider deferring  additional surveillance colonoscopies.     Venita Lick. Russella Dar, MD, [facility]  Electronically Signed    MTS/MedQ  DD: 06/26/2007  DT: 06/26/2007  Job #: [id number]   cc:   [name], MD

## 2010-07-28 NOTE — Op Note (Signed)
NAMELIDO, MASKE                 ACCOUNT NO.:  [id number]   MEDICAL RECORD NO.:  [id number]          PATIENT TYPE:  INP   LOCATION:  3305                         FACILITY:  [facility]   PHYSICIAN:  Balinda Quails, M.D.    DATE OF BIRTH:  [birth year]   DATE OF PROCEDURE:  08/09/2006  DATE OF DISCHARGE:                               OPERATIVE REPORT   SURGEON:  Balinda Quails, M.D.   ASSISTANT:  Jerold Coombe, P.A.-C.   ANESTHESIA:  General endotracheal.   ANESTHESIOLOGIST:  Burna Forts, M.D.   PREOPERATIVE DIAGNOSIS:  Severe right internal carotid artery stenosis.   POSTOPERATIVE DIAGNOSIS:  Severe right internal carotid artery stenosis.   PROCEDURE:  Right carotid endarterectomy with Dacron patch angioplasty.   CLINICAL NOTE:  [name] is a 77 year old male with a recent history  of left arm numbness and weakness.  This improved over several days.  He  was seen in the office Aug 04, 2006, in consultation.  He underwent  prior carotid Doppler revealing a severe right internal carotid artery  stenosis.  He underwent carotid CT angiogram verifying severe plaque  disease of the right carotid bifurcation.  He is brought to the  operating room at this time for right carotid endarterectomy for  reduction of stroke risk.  The potential operative risks were discussed  with the patient with a major morbidity mortality 1-2% to include but  not limited to MI, CVA, cranial nerve injury, and death.   OPERATIVE PROCEDURE:  The patient was brought to the operating room in  stable hemodynamic condition.  He was placed under general endotracheal  anesthesia.  The right neck was prepped and draped in a sterile fashion.  Foley catheter and arterial line in place.   A curvilinear skin incision was made along the anterior border of the  right sternomastoid muscle.  Deep dissection was carried down to expose  the carotid vessels.  The facial vein was ligated with 2-0 silk and  divided.   The carotid bifurcation was exposed.  The common carotid  artery was mobilized down to the omohyoid muscle and encircled with a  vessel loop.  The vagus nerve was reflected posteriorly and preserved.  The origin of the superior thyroid and external carotid were freed and  encircled with vessel loops.  The internal carotid was followed distally  up to the posterior belly of the digastric muscle and encircled with a  vessel loop.  The hypoglossal nerve was clearly identified and  preserved.   The carotid bifurcation revealed extensive calcified plaque disease  extending from the bulb up into the right internal carotid artery.  The  patient was administered 7000 units heparin intravenously.  Adequate  circulation time was permitted.   The carotid vessels were controlled with clamps.  A longitudinal  arteriotomy was made in the distal common carotid artery.  The  arteriotomy extended across the carotid bulb up into the internal  carotid artery.  There was extensive calcified plaque present in the  right internal carotid artery and a high grade stenosis.  The shunt  was  inserted.  An endarterectomy elevator was used to remove the plaque.  The endarterectomy was carried down in the common carotid artery with  plaque divided transversely with Potts scissors.  The plaque then raised  up in the bulb and the superior thyroid and external carotid were  endarterectomized using an eversion technique.  The distal internal  carotid artery plaque then feathered out well.  Fragments of plaque were  removed with fine forceps.  The site was irrigated with heparin saline  solution.   A patch angioplasty of the endarterectomy site was then carried out  using running 6-0 Prolene suture and a Finesse Dacron patch.  At the  completion of the patch angioplasty, the shunt was removed.  All vessels  were well flushed.  The clamps were removed directing the initial  antegrade flow up the external carotid artery,  common carotid, and  following this, the internal carotid was released.  Excellent pulse and  Doppler signal were present.  Adequate hemostasis was obtained.  Sponges  and instrument counts were correct.  The sternomastoid fascia was closed  with running 2-0 Vicryl suture.  The platysma was closed with a running  3-0 Vicryl suture.  The skin was closed with 4-0 Monocryl.  Steri-Strips  were applied.  A sterile dressing was applied.  The patient tolerated  the procedure well.  There were no apparent complications.  He was  transferred to the recovery room in stable condition.      Balinda Quails, M.D.  Electronically Signed     PGH/MEDQ  D:  08/09/2006  T:  08/09/2006  Job:  [id number]   cc:   [name], MD

## 2010-07-28 NOTE — Assessment & Plan Note (Signed)
[facility]                            CARDIOLOGY OFFICE NOTE   AMEAR, STROJNY                        MRN:          [id number]  DATE:01/18/2008                            DOB:          15-Jan-1926    PRIMARY CARE PHYSICIAN:  [name], MD   CLINICAL HISTORY:  Mr. Mogle is 77 years old and returned for followup  after his recent hospitalization for syncope.  He was admitted with  syncope and had a Myoview scan, which suggested anterior ischemia.  At  catheterization, we found a high-grade lesion in the circumflex artery,  which we stented with an endeavor drug-eluting stent.  This did not  correspond with the defect on his Myoview scan, but because of the  critical nature of the lesion, we felt that it should be treated.  We  were uncertain whether his syncope was related to an arrhythmia from his  coronary artery disease or not.  He did have nonsustained V-tach in the  hospital.  We provided an event monitor when he went home and over the  first 2 weeks, we have only seen isolated PVCs.   PAST MEDICAL HISTORY:  Significant for a previous stroke in 2008,  hyperlipidemia with intolerance to LIPITOR, GERD, chronic obstructive  pulmonary disease, hypertension, and mild dementia.   CURRENT MEDICATIONS:  1. Pravastatin 20 mg daily.  2. Zantac.  3. Aspirin 81 mg b.i.d.  4. Plavix 75 mg daily.  5. Coreg 3.125 mg b.i.d.  6. Aricept.  7. Centrum.  8. Caltrate.   PHYSICAL EXAMINATION:  VITAL SIGNS:  The blood pressure is 130/86 and  pulse 61 and regular.  NECK:  There was no venous tension.  The carotid pulses were full  without bruits.  CHEST:  Clear.  CARDIAC:  Rhythm is regular.  I could hear no murmurs or gallops.  ABDOMEN:  Soft with normal bowel sounds.  EXTREMITIES:  Peripheral pulses were full with no peripheral edema.   Electrocardiogram was normal.   IMPRESSION:  1. Recent admission for a syncopal episode possibly related  to      ischemic arrhythmia.  2. Coronary artery disease status post stenting of a lesion in the      marginal branch of circumflex artery using an endeavor drug-eluting      stent.  3. Good left ventricular function.  4. Nonsustained ventricular tachycardia in the hospital.  5. Hypertension.  6. Hyperlipidemia.  7. Chronic obstructive pulmonary disease.  8. Dementia.   RECOMMENDATIONS:  I think Mr.Berry is doing well.  I think we can stop  his event monitor at this point.  I will continue his same medicines.  He is only tolerating pravastatin, so we will not make any adjustments  in that.  I will plan to see him back in 5 months.     Bruce Elvera Lennox Juanda Chance, MD, [facility]  Electronically Signed    BRB/MedQ  DD: 01/18/2008  DT: 01/19/2008  Job #: [id number]   cc:   [name], MD

## 2010-07-28 NOTE — Consult Note (Signed)
NAMEOZZIE, KNOBEL                 ACCOUNT NO.:  [id number]   MEDICAL RECORD NO.:  [id number]          PATIENT TYPE:  INP   LOCATION:  2038                         FACILITY:  [facility]   PHYSICIAN:  Duke Salvia, MD, FACCDATE OF BIRTH:  August 20, 1925   DATE OF CONSULTATION:  12/27/2007  DATE OF DISCHARGE:                                 CONSULTATION   Thank you very much for asking [place] to see [name] in consultation  because of syncope.   Mr. 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  His wife was sitting across from him and recalls that  without warning he collapsed forward into the grits.  She called his  name and within 15-30 seconds he was awake with very little residual.  She does describe him as being extremely diaphoretic and quite pale.  There was no other persistent symptoms.   The patient has no antecedent history of syncope.  He does have some  history of palpitations which mostly come with stress and exertion.   He has modest dyspnea on exertion.  He has a history of peripheral  vascular disease with prior carotid endarterectomy.  We have no  information regarding preprocedural stress testing and myocardial  perfusion.   By cardiac ultrasound, his low left ventricular function is normal.  His  ECG is also normal.   He does have a history of some COPD.   His past medical history in addition to above is notable for:  1. Mild dementia.  2. Dyslipidemia.  3. GE reflux disease.  4. Polymyalgia rheumatica.  5. BPH.  6. Generalized arthritis.   His review of systems in addition to above is notable for some sweats  mostly associated with exertion.  Hard of hearing.  Across multiple  other organ systems, the  review of systems was negative.   His medications include Aricept 1 daily, Valium, pravastatin once a day,  Zantac 150, and aspirin 325.   He is allergic to DEMEROL.   SOCIAL HISTORY:  He is retired as noted.  He is married and lives with  his wife.  He has 4 children, if I recall, and 5 grandchildren.  He does  not use cigarettes, alcohol, or recreational drugs.  He does puff on  cigar periodically.   His family history is noncontributory.   PHYSICAL EXAMINATION:  He is an elderly Caucasian male, appearing his  stated age of 77.  His blood pressure is 142/75 with a pulse of 75.  His  HEENT exam demonstrated no icterus or xanthoma.  His neck veins were  flat.  His carotids are brisk and full bilaterally without bruits.  The  back was without kyphosis or scoliosis.  His lungs were clear.  Heart  sounds were regular without murmurs or gallops.  The abdomen  was soft  with active bowel sounds.  Femoral pulses were 2+.  Distal pulses were  intact.  There was no clubbing, cyanosis, or edema.  His neurological  exam was grossly normal.  The skin was warm and dry.   Electrocardiogram dated yesterday demonstrated sinus rhythm at 54 with  intervals of 0.19/0.09/0.43.  The electrocardiogram was otherwise  normal.   Telemetry notable for an episode of nonsustained ventricular tachycardia  of 7 beats and cycle length of approximately 400 milliseconds or so.  He  also had occasional sinus pauses.   IMPRESSION:  1. Syncope - isolated - abrupt onset and offset.  2. Nonsustained ventricular tachycardia.  3. Some evidence of sinus node dysfunction with bradycardia and sinus      pauses.  4. Normal left ventricular function.  5. Peripheral vascular disease, status post carotid endarterectomy.  6. Dyspnea on exertion.  7. Chronic obstructive pulmonary disease.   DISCUSSION:  Mr. Weatherholtz had an abrupt episode of syncope while sitting  in a chair without warning and without significant residual.   The  brevity and the positional situation would suggest a bradyarrhythmic  event, which is not supported by his normal electrocardiogram.   Nonsustained ventricular tachycardia is also somewhat concerning,  however, its predictive value is most closely linked to the presence of  an underlying cardiomyopathy and its predictive value of EP testing,  probably limited to the patients with ischemic heart disease for which  we have again no evidence based on echo and electrocardiogram.  Clarifying the presence or absence of scar, I think is important.   It is unlikely by story that this represents neurally mediated syncope,  although this potentially could be so particularly given the normal LV  function, although the lack of antecedent symptoms, the positional  nature and the brevity the episode make me think that this is less  likely.   RECOMMENDATIONS:  1. Based on the above, I would therefore want to clarify the presence      and absence of scarring by cardiac MR or Myoview scanning.  2. If the above is abnormal, he will need catheterization and EP      testing, and if the above is normal, I would then discharge him      with an outpatient event recorder.   Thank you for the consultation.     Duke Salvia, MD, [facility]  Electronically Signed    SCK/MEDQ  D:  12/27/2007  T:  12/28/2007  Job:  [id number]   cc:   [name], MD

## 2010-07-28 NOTE — Procedures (Signed)
CAROTID DUPLEX EXAM   INDICATION:  Follow up right carotid endarterectomy.   HISTORY:  Diabetes:  No.  Cardiac:  No.  Hypertension:  No.  Smoking:  Quit.  Previous Surgery:  Right carotid endarterectomy on 08/09/06 by Dr.  Madilyn Fireman.  CV History:  Previous TIA.  Amaurosis Fugax No, Paresthesias No, Hemiparesis No.                                       RIGHT             LEFT  Brachial systolic pressure:         128               126  Brachial Doppler waveforms:         Normal            Normal  Vertebral direction of flow:        Antegrade         Antegrade  DUPLEX VELOCITIES (cm/sec)  CCA peak systolic                   100               85  ECA peak systolic                   97                72  ICA peak systolic                   67                66  ICA end diastolic                   29                25  PLAQUE MORPHOLOGY:                  None              Heterogenous  PLAQUE AMOUNT:                      None              Mild  PLAQUE LOCATION:                    None              ICA/CCA   IMPRESSION:  1. Patent right carotid endarterectomy site with no evidence of      stenosis noted.  2. 1-39% stenosis of the left internal carotid artery.  3. No significant change noted when compared to previous examination      on 02/16/07.   ___________________________________________  P. Liliane Bade, M.D.   CH/MEDQ  D:  08/17/2007  T:  08/17/2007  Job:  [id number]

## 2010-07-28 NOTE — Discharge Summary (Signed)
NAMEOMARRION, CARMER                 ACCOUNT NO.:  [id number]   MEDICAL RECORD NO.:  [id number]          PATIENT TYPE:  INP   LOCATION:  3305                         FACILITY:  [facility]   PHYSICIAN:  Balinda Quails, M.D.    DATE OF BIRTH:  09/20/1925   DATE OF ADMISSION:  08/09/2006  DATE OF DISCHARGE:                               DISCHARGE SUMMARY   PRINCIPAL DIAGNOSIS:  Severe right internal carotid stenosis.   IN-HOSPITAL DIAGNOSIS:  Postoperative hypotension.   SECONDARY DIAGNOSIS:  Hyperlipidemia.   IN-HOSPITAL OPERATIONS/PROCEDURE:  Right carotid endarterectomy with  Dacron patch angioplasty.   PATIENT'S HISTORY AND PHYSICAL AND HOSPITAL COURSE:  Mr. Mcgowan is a 77-  year-old male who awoke a couple of weeks ago with left-arm numbness  noticed.  This improved the last several days.  He was seen by Dr.  Amador Cunas on Jul 19, 2006 with our office.  By this time patient's  symptoms and signs had cleared.  He has had no other symptoms.  Denies  visual loss, speech problems.  Carotid Duplex ultrasounds done Jul 21, 2006 revealed a severe right internal carotid artery stenosis that  needed a stent, with mild left carotid bifurcation disease.  Patient was  seen and evaluated by Dr. Madilyn Fireman.  Dr. Madilyn Fireman discussed with the patient  regarding right carotid endarterectomy.  He discussed the risks and  benefits of this procedure with the patient.  Patient acknowledged  understanding and agreed to proceed.  For details of the patient's past  medical history and physical exam, please see dictated H&P.   Patient was taken to the operating room on Aug 09, 2006, where he  underwent right carotid endarterectomy with Dacron patch  angioplasty.  Patient tolerated this procedure well and returned to the intensive care  unit in stable condition.  Postoperatively patient was noted to be  hypotensive and required a dopamine drip.  Postop day 1 the patient was  still on dopamine drip, with systolic blood  pressures in the high 90s-  100s on dopamine.  His heart rate was stable.  Patient was able to be  weaned off oxygen, saturating greater than 90%.  Patient was noted to be  hemodynamically stable, with hemoglobin and hematocrit 10.7 and 31.7%.  His creatinine was stable at 0.86.  Patient's pulmonary status was  stable.  He was in normal sinus rhythm.  His neuro intact.  Patient is  alert and oriented x4.  Incisions clean and dry and intact and healing  well.  Unfortunately patient did require the dopamine until the  afternoon.  He was unable to be weaned off dopamine, but still remained  with systolic blood pressure in the high 80s-low 90s.  With ambulation  the patient did improve with a systolic blood pressure increasing into  the 120s.  Patient is asymptomatic with a blood pressure in the high 80s-  low 90s.  Patient remained in the Intensive Care Unit overnight and  postop day 1.   Patient is essentially ready for discharge home in the a.m. postop day  2, pending blood pressure.  FOLLOWUP APPOINTMENTS:  A followup appointment will be arranged with Dr.  Madilyn Fireman in 3 weeks.  Our office will contact patient with this  information.   ACTIVITY:  Patient is instructed no driving until released to do so.  No  heavy lifting over 10 pounds.  Patient is told to ambulate 3-4 times per  day.   Since tolerating, continue breathing exercises.   INCISIONAL CARE:  Patient is told to shower, washing his incision using  soap and water .  He is to contact the office if he develops any  drainage or opening from the  incision site.   DIET:  Patient discharged on diet to be low-fat, low-salt.   DISCHARGE MEDICATIONS:  1. Percocet 5/325 one-two tabs q.4-6 hours p.r.n. pain.  2. Lipitor 20 mg daily.  3. Aspirin 325 mg daily.  4. Multivitamin daily.  5. Prednisone 2.5 mg daily, as needed.  6. Vitamin B12 daily.  7. Zantac 150 mg daily.  8. Citrucel 600 with vitamin D daily.      Stephanie Acre  Dominick, PA      P. Liliane Bade, M.D.  Electronically Signed    KMD/MEDQ  D:  08/10/2006  T:  08/11/2006  Job:  [id number]

## 2010-07-28 NOTE — Procedures (Signed)
CAROTID DUPLEX EXAM   INDICATION:  Followup carotid artery disease.   HISTORY:  Diabetes:  No.  Cardiac:  No.  Hypertension:  No.  Smoking:  Quit.  Previous Surgery:  Right CEA with DPA Aug 09, 2006 by Dr. Madilyn Fireman.  CV History:  TIA.  Amaurosis Fugax No, Paresthesias No, Hemiparesis No.                                       RIGHT             LEFT  Brachial systolic pressure:         130               127  Brachial Doppler waveforms:         Triphasic         Triphasic  Vertebral direction of flow:        Antegrade         Antegrade  DUPLEX VELOCITIES (cm/sec)  CCA peak systolic                   73                57  ECA peak systolic                   61                97  ICA peak systolic                   76                77  ICA end diastolic                   22                26  PLAQUE MORPHOLOGY:                  None              Mixed  PLAQUE AMOUNT:                      None              Mild  PLAQUE LOCATION:                    None              ICA/bifurcation   IMPRESSION:  1. The right internal carotid artery shows no evidence of restenosis      status post CEA.  2. The left internal carotid artery shows evidence of 1-39% stenosis.   ___________________________________________  P. Liliane Bade, M.D.   AS/MEDQ  D:  02/16/2007  T:  02/16/2007  Job:  [id number]

## 2010-07-28 NOTE — Assessment & Plan Note (Signed)
OFFICE VISIT   [name] [name], [name] [name]  DOB:  1925-11-30                                       08/29/2009  EAVWU#:[id number]   REASON FOR VISIT:  Surveillance carotid duplex.   Patient is an 77 year old Caucasian male who is status post right  carotid endarterectomy with Dacron patch angioplasty by Dr. Denman George on 08/09/2006.  This was done for probable ischemic right brain  stroke.  He presented with left arm numbness and weakness that lasted  several days but then improved, and he has no residual effects.  Since  surgery he has done quite well.  He continues to denies any neurologic  symptoms such as visual or speech changes or unilateral weakness.  He  has smoked tobacco products since being a teenager, but said he quit  smoking cigarettes about 30 years ago but continued to smoke at least 1  cigar per week.  He does not drink alcohol on a regular basis.   Additional past medical history is significant for hypercholesteremia,  back surgery, and syncope.  By report, workup revealed coronary artery  disease, and he underwent a cardiac stent in October, 2009 by Dr. Graciela Husbands,  but I believe Dr. Juanda Chance is his primary cardiologist.   Review of systems is positive for generally feeling weak in his legs but  does not describe any classic claudication symptoms.  He also gets short  of breath with exertion and occasional wheezing and has issues with  joint pain and arthritis, changes in his hearing and anxiety.  Otherwise, his review of systems is negative per VDS history form.   He underwent carotid duplex exam today which showed a patent right  carotid endarterectomy site without evidence of stenosis in the right  internal carotid artery and 1% to 39% stenosis of the left internal  carotid artery.  There was antegrade flow in bilateral vertebral  arteries.  This was no significant change from his prior study.   Physical exam findings show a heart rate of 16,  blood pressure 144/90 in  the right arm, 147/93 in the left arm.  Oxygen saturation 98%.  The  general exam shows a well-nourished, well-developed, pleasant white  male.  Head is normocephalic atraumatic.  Pupils are equal.  Sclera  nonicteric.  Lung sounds are clear bilaterally.  His heart has a regular  rate and rhythm.  No carotid bruits were auscultated.  There was no  significant peripheral edema.  Abdomen is soft, nontender.  No masses  were palpated.  Neuro exam was nonfocal without weakness.  Extremities  showed 2+ radial pulses and nonpalpable pedal pulses but feet were warm  to touch.   Patient continues to do well following right carotid endarterectomy.  We  did discuss the effects of tobacco.  Encouraged him to quit smoking  cigars but he did not seem that interested.  It was recommended that he  continue antiplatelet therapy.  Currently he is on aspirin 325 mg daily  as well as Plavix for his cardiac stent.  We will plan to see him back  in 1 year or sooner if issues arise.   Jerold Coombe, PA   Larina Earthly, M.D.  Electronically Signed   AWZ/MEDQ  D:  08/29/2009  [name]:  08/29/2009  Job:  [id number]   cc:   Janett Labella.  Burnice Logan, MD

## 2010-07-28 NOTE — Procedures (Signed)
CAROTID DUPLEX EXAM   INDICATION:  Right carotid endarterectomy.   HISTORY:  Diabetes:  No.  Cardiac:  Stent.  Hypertension:  Yes.  Smoking:  Previous.  Previous Surgery:  Right carotid endarterectomy on 08/09/2006.  CV History:  Asymptomatic.  Amaurosis Fugax No, Paresthesias No, Hemiparesis No                                       RIGHT             LEFT  Brachial systolic pressure:         124               124  Brachial Doppler waveforms:         Normal            Normal  Vertebral direction of flow:        Antegrade         Antegrade  DUPLEX VELOCITIES (cm/sec)  CCA peak systolic                   81                83  ECA peak systolic                   106               77  ICA peak systolic                   64                56  ICA end diastolic                   16                16  PLAQUE MORPHOLOGY:                                    Mixed  PLAQUE AMOUNT:                      None              Mild  PLAQUE LOCATION:                                      ICA/CCA    IMPRESSION:  1. Patent right carotid endarterectomy site with no evidence of      stenosis in the right internal carotid artery.  2. 1-39% stenosis of the left internal carotid artery.  3. No significant change noted when compared to the previous exam of      08/17/2007.   ___________________________________________  P. Liliane Bade, M.D.   CH/MEDQ  D:  08/22/2008  T:  08/22/2008  Job:  [id number]

## 2010-07-28 NOTE — Assessment & Plan Note (Signed)
OFFICE VISIT   [name], [name]  DOB:  Nov 05, 1925                                       08/25/2006  ZOXWR#:[id number]   [name] is status post right carotid endarterectomy carried out Aug 09, 2006 for severe right internal carotid stenosis associated with  transient ischemic attacks.  He did well following surgery with no  complications.  Discharged home in good condition.  Returns today for  scheduled followup.   BP 118/80.  Pulse is 77 per minute.  Right neck incision reveals mild  swelling.  No wound complications evident.  Cranial nerves are intact.  Strength equal bilaterally.   Mr. Fox is doing well following his recent surgery.  We will plan  followup with him again in approximately 6 months with routine carotid  Doppler evaluation.   Balinda Quails, M.D.  Electronically Signed   PGH/MEDQ  D:  08/25/2006  T:  08/26/2006  Job:  [id number]   cc:   [name], MD

## 2010-07-28 NOTE — Cardiovascular Report (Signed)
[name] [name], [name] [name]                 ACCOUNT NO.:  [id number]   MEDICAL RECORD NO.:  [id number]          PATIENT TYPE:  INP   LOCATION:  6524                         FACILITY:  [facility]   PHYSICIAN:  Bruce R. Juanda Chance, MD, FACCDATE OF BIRTH:  01-14-1926   DATE OF PROCEDURE:  12/29/2007  DATE OF DISCHARGE:                            CARDIAC CATHETERIZATION   CLINICAL HISTORY:  [name] [name] is 77 year old and has a history of prior  stroke, COPD, hypertension, and previous right carotid enterectomy.  He  was admitted with a frank syncopal episode while on the restaurant.  He  had a Myoview scan in the hospital with suggested anterior ischemia, was  scheduled today for catheterization.   PROCEDURE:  The procedure was performed by the right femoral artery and  arterial sheath and 5-French pyriform coronary catheters.  A front wall  arterial puncture was performed, and Omnipaque contrast was used.  At  completion of the diagnostic study, I made a decision to proceed with  intervention on the lesion in the circumflex artery.   The patient was given Angiomax bolus and infusion and previously have  been given chewable aspirins.  He was given 600 mg of Plavix and 20 mg  of Pepcid.  We used a CLS4 6 [place] guiding catheter with side holes.  We passed a Prowater wire across the lesion without too much difficulty.  We predilated with a 2.5 x 15-mm apex balloon performing one inflation  of the 10 atmospheres for 30 seconds.  We then deployed a 3.0 x 12-mm  Endeavor stent deploying this with one inflation of 16 atmospheres for  30 seconds.  We postdilated with a 3.5 x 8-mm Dunn Voyager performing one  inflation up to 16 atmospheres for 30 seconds.  Final diagnostic were  then performed through the guiding catheter.  The patient tolerated the  procedure well and left laboratory in satisfactory condition.   RESULTS:  The aortic pressure was 130/66 with a mean of 92.  Left  ventricular pressure was  130/11.   Left main coronary artery:  The left main coronary artery was free of  disease.   Left anterior descending artery:  Left anterior descending artery gave  rise to 3 diagonal branch and the septal perforator.  These in the LAD  proper were free of significant disease.   Circumflex artery:  The circumflex artery gave rise to a atrial branch  of small marginal branch and then a large marginal branch.  The large  marginal branch had a 90% proximal stenosis.   Right coronary artery:  The right coronary artery is a moderately large  vessel gave rise to a conus branch, right ventricular branch, posterior  descending branch, and a posterolateral branch.  These vessels were free  of significant disease.   Left ventriculogram:  The ventriculogram performed in the RAO projection  shows good wall motion with no areas of hypokinesis.   Following stenting, the lesion in the proximal portion of the marginal  branch and the circumflex artery stenosis improved from 90% to 0%.   CONCLUSION:  1. Coronary artery disease  with 90% stenosis in the marginal branch of      the circumflex artery, no significant obstruction of the left      anterior descending and right coronary arteries and normal LV      function.  2. Successful PCI of the lesion in the marginal branch of the      circumflex artery using an Endeavor drug-eluting stent with      improvement of center narrowing from 90% to 0%.   DISPOSITION:  The patient returned to post angio room for further  observation.  It is possible that the lesion in the circumflex artery  could have cause and the arrhythmia responsible for Mr. Maahs  syncope, but this is by far from certain.  The lesion was quite tight,  but did not correspond with the abnormality on the Myoview scan.  I  think the patient will probably be able to go home tomorrow, will still  consider an outpatient monitor.  He did have nonsustained V-tach in the   hospital.      Bruce R. Juanda Chance, MD, [facility]  Electronically Signed     BRB/MEDQ  D:  12/29/2007  T:  12/29/2007  Job:  [id number]

## 2010-07-28 NOTE — Assessment & Plan Note (Signed)
[facility]                            [facility] OFFICE NOTE   RAFAL, ARCHULETA                        MRN:          [id number]  DATE:07/19/2006                            DOB:          [birth year]    The patient is 77 years old, and awoke Sunday morning with left arm  numbness and weakness.  This has improved throughout the week.  Presently, only notices mild left arm weakness.  No other focal  neurological symptoms.   EXAMINATION:  Exam today revealed low normal blood pressure.  NECK:  Revealed dimunitive carotid upstroke, but no bruits.  There was no facial asymmetry.  Extraocular muscles were full.  There is slight drift to the left outstretched arm.  Arm and grip  strength were diminished.  Reflexes were fairly symmetrical.  Some  dystaxia with fine motor activity noted with the left hand.   IMPRESSION:  Probable ischemic right brain stroke.   DISPOSITION:  He will be placed on aspirin 325 daily.  A CT of the head,  as well as carotid artery Doppler ultrasounds will be checked.  He will  be placed on Lipitor 20 mg daily.  ASA guidelines suggest high-dose  atorvastatin, and blood pressure medications, even in normotensive  patients.  We will recheck in 2 weeks and consider additional therapy,  depending on his clinical status.  To report any clinic worsening.     [name], MD  Electronically Signed    PFK/MedQ  DD: 07/19/2006  DT: 07/19/2006  Job #: [id number]

## 2010-07-28 NOTE — Assessment & Plan Note (Signed)
OFFICE VISIT   KALAI, BACA  DOB:  September 09, 1925                                       02/16/2007  RJJOA#:[id number]   The patient underwent a right carotid endarterectomy in May of this  year.  This was carried out for severe right internal carotid artery  stenosis associated with transient ischemic attack.  He has been doing  well since surgery.  No recurrence of neurologic symptoms.  He  specifically denies sensory, motor, or visual deficit.   PAST MEDICAL HISTORY:  Significant for hyperlipidemia.   Denies recent chest pain.  No shortness of breath.   He does complain of some fullness at the lower end of his incision of  his right carotid endarterectomy site.   The patient appears well, a 77 year old gentleman, appears stated age.  Alert and oriented.  In no acute distress.  BP 127/76, pulse 72 per  minute and regular, respirations 18 per minute.   No carotid bruits audible.  Neck incision well-healed.  There is a small  area of induration in the lower end of the incision.  Cranial nerves  intact.  Strength equal bilaterally.   I did probe this small area in the lower end of his incision, and there  was a retained stitch, which was excised.   The patient is overall doing well.  I will plan to follow up with him  again in 1 year with a carotid Doppler evaluation.   Balinda Quails, M.D.  Electronically Signed   PGH/MEDQ  D:  02/16/2007  T:  02/17/2007  Job:  [id number]   cc:   [name], MD

## 2010-07-30 NOTE — Procedures (Unsigned)
CAROTID DUPLEX EXAM  INDICATION:  Right CEA.  HISTORY: Diabetes:  No. Cardiac:  Yes. Hypertension:  Yes. Smoking:  Yes. Previous Surgery:  Right CEA, 08/09/2006. CV History: Amaurosis Fugax No, Paresthesias No, Hemiparesis No.                                      RIGHT             LEFT Brachial systolic pressure:         138               138 Brachial Doppler waveforms:         WNL               WNL Vertebral direction of flow:        Antegrade         Antegrade DUPLEX VELOCITIES (cm/sec) CCA peak systolic                   84                89 ECA peak systolic                   84                76 ICA peak systolic                   97                67 ICA end diastolic                   34                20 PLAQUE MORPHOLOGY:                  Soft              Heterogenous PLAQUE AMOUNT:                      Mild              Mild PLAQUE LOCATION:                    CEA               CCA/ECA/ICA  IMPRESSION: 1. Widely patent right carotid endarterectomy with mild hyperplasia     observed. 2. 1% to 39% plaquing of the left internal carotid artery. 3. Bilateral vertebral arteries are antegrade.  ___________________________________________ Larina Earthly, M.D.  LT/MEDQ  D:  07/22/2010  T:  07/22/2010  Job:  [id number]

## 2010-07-31 NOTE — Op Note (Signed)
[name], [name]                           ACCOUNT NO.:  [id number]   MEDICAL RECORD NO.:  [id number]                   PATIENT TYPE:  OIB   LOCATION:  2871                                 FACILITY:  [facility]   PHYSICIAN:  Donalee Citrin, M.D.                     DATE OF BIRTH:  1926-03-02   DATE OF PROCEDURE:  08/14/2002  DATE OF DISCHARGE:                                 OPERATIVE REPORT   PREOPERATIVE DIAGNOSIS:  Spinal stenosis, L4-5 right, from facet arthropathy  and a large ruptured disk at L4-5 right.   PROCEDURE:  Decompressive laminectomy from the right at L4-5 with lumbar  microdiskectomy at L4-5, microscope dissection of the L4 and L5 nerve root  on the right.   SURGEON:  Donalee Citrin, M.D.   ASSISTANT:  Kathaleen Maser. Pool, M.D.   ANESTHESIA:  General endotracheal.   CLINICAL HISTORY:  The patient is a very pleasant 77 year old gentleman who  has had longstanding back and right leg pain radiating from his hip down to  the top of his foot and his big toe, consistent with an L5 and L4  radiculopathy.  Preoperative imaging showed a very large ruptured disk with  a caudal migration of a free fragment in the L3-4 vertebral body as well as  severe facet arthropathy and spinal stenosis.  The patient was recommended  lumbar laminectomy and microdiskectomy.  I extensively went over the risks  and benefits of surgery, and he understands and agreed to proceed forward.   DESCRIPTION OF PROCEDURE:  The patient was brought into the OR and was  induced under general anesthesia.  He was placed prone on the Wilson frame,  and the back was prepped and draped in the usual sterile fashion.  Preoperatively I localized the L4-5 disk space and a midline incision made  after infiltration with 10 mL of lidocaine with epinephrine.  Bovie  electrocautery was used to take it down through the subcutaneous tissue and  fascia and the dissection carried out to the lamina of L4 and L5.  Then  using a  high-speed drill I _________ of L4 and the medial aspect of the  facet complex at L5.  This was drilled down after intraoperative x-ray  confirmed localization of the correct interspace.  Then using a 3 and 4 mm  Kerrison punch, virtually the entire inferior lamina of L4 was removed, and  the medial aspect of the facet complex as well as the superior aspect of the  lamina of L5, exposing the thick ligamentum flavum.  The ligamentum flavum  was noted to be several hypertrophied, and the facet was noted to be  compressing the thecal sac.  This was all teased away with a 4 Penfield and  under microscopic illumination this was removed, decompressing the thecal  sac.  Then using a 4 Penfield, the L5 nerve root was dissected  off the large  bulbous disk fragment.  This was thinned out with an 11 blade scalpel,  pituitary rongeurs were used, I opened up the disk space with the L5 nerve  root being reflected via the D'Errico nerve root retractor.  Then several  fragments of disk were teased away from underneath the ligament behind the  L4 vertebral body with a coronary dilator and angled hockey stick.  The L4  lamina needed to be removed to further extend the decompression to ensure  that the L4 nerve root was decompressed as well as removal of the disk  fragments compressing the thecal sac.  These were all directly visualized  under the microscope and using a coronary dilator and angled hockey stick,  no further stenosis was appreciated.  Then all disk fragments were removed  from behind the L4 vertebral body.  Then the wound was copiously irrigated  and meticulous hemostasis was maintained.  The L4 and L5 nerve roots were re-  explored with an angled nerve hook and the hockey stick and there was noted  to be no further stenosis.  Gelfoam was overlaid on top of the dura.  The  muscle and fascia were reapproximated with 0 interrupted Vicryl.  The subcutaneous tissue was closed with 2-0 interrupted  Vicryl and the skin  was closed with running 4-0 subcuticular.  Benzoin and Steri-Strips applied.  The patient went to the recovery room in stable condition.  At the end of  the case all needle counts and sponge counts were correct.                                               Donalee Citrin, M.D.    GC/MEDQ  D:  08/14/2002  T:  08/14/2002  Job:  [id number]

## 2010-07-31 NOTE — Op Note (Signed)
[place]  [name]:    [name] [name], [name] [name]                        MRN: [id number] Proc. Date: 06/07/00 Adm. Date:  [id number] Attending:  Sypher, Douglass Rivers CC:         [name], M.D.                           Operative Report  PREOPERATIVE DIAGNOSIS:  Chronic stage 4, degenerative arthritis, left thumb carpometacarpal joint with multiple loose bodies and significant dorsal radial subluxation of thumb metacarpal off trapezium.  POSTOPERATIVE DIAGNOSIS:  Chronic stage 4, degenerative arthritis, left thumb carpometacarpal joint with multiple loose bodies and significant dorsal radial subluxation of thumb metacarpal off trapezium.  OPERATION PERFORMED: 1. Excision of left trapezium. 2. Palmaris longus free tendon graft intermetacarpal ligament reconstruction    between index and thumb metacarpals. 3. Transfer of abductor pollicis longus to second metacarpal for "suspension    plasty."  SURGEON:  [name]. Sypher, [name]., M.D.  ASSISTANT:  Jonni Sanger, P.A.  ANESTHESIA:  Axillary block.  SUPERVISING ANESTHESIOLOGIST:  Dr. [name].  INDICATIONS FOR PROCEDURE:  [name] is a 77 year old man referred by [name], M.D. for consultation regarding pain at the base of his left thumb.  He has had signs of degenerative arthritis for more than five years.  Serial x-rays revealed bone-on-bone arthropathy with multiple loose bodies in the left thumb carpometacarpal joint.  He had no hyperextension at the MP joint.  Due to failure to respond to nonoperative measures, he was advised to consider proceeding with trapezium excision and tendon suspension arthroplasty. Preoperatively, he was advised that he would be observed for 24 hours for IV antibiotic therapy and appropriate pain management and would need to be splinted for approximately six weeks postoperatively.  After questions were invited and answered, he was brought  to the operating room at this time for reconstruction of his left thumb CMC joint.  DESCRIPTION OF PROCEDURE:  [name] was brought to the operating room and placed in supine position on the operating table.  Following placement of an axillary block in the holding area, anesthesia was satisfactory in the left arm.  The arm was then prepped with Betadine soap and solution and sterilely draped.  One gram of Ancef was administered as an IV prophylactic antibiotic.  The procedure commenced with a curvilinear incision at the base of the thumb exposing the interval between the abductor pollicis longus and extensor pollicis brevis tendons.  This was sharply incised over the capsule of the [name] joint.  With great care, the trapezium was exposed subperiosteally with a 4 mm osteotome.  The trapezium was then morselized and removed piecemeal.  A complete synovectomy of the [name] joint was accomplished as well as removal of multiple loose bodies.  The flexor carpi radialis tendon was carefully preserved throughout dissection.  The palmaris longus was harvested through a short transverse incision at the distal wrist flexion crease by use of a Brand tendon stripper. This was then stripped of all muscle and placed in a saline soaked sponge for later use for intermetacarpal ligament reconstruction.  Two drill holes were created, one through the base of the index metacarpal distal to the facet for articulation with the thumb metacarpal, brought out through the dorsal ulnar cortex adjacent to the long finger metacarpal metaphysis.  The  second drill hole was created obliquely through the base of the thumb metacarpal from 1 cm distal to the articular surface to the central portion of the thumb metacarpal proximal articular surface.  Care was taken to debride the beak osteophyte.  The palmaris longus was then passed from the trapezium removal space dorsally through the index metacarpal base, looped into  the extensor carpi radialis longus and its free tail brought back down through the second metacarpal drill hole.  This was apportioned to a two-thirds one-third tendon graft which was then secured at its insertion point on the extensor carpi radialis longus dorsally with a 3-0 Ethibond mattress suture.  The short tail was brought to the base of the thumb metacarpal, secured at proper tension to the dorsal periosteum.  The long tail was then brought through the base of the thumb metacarpal around 360 degrees creating a loop at the base of the thumb and secured to the dorsal periosteum with proper tension to create an intermetacarpal ligament reconstruction.  One half of the abductor pollicis longus dorsal slip was harvested through an oblique incision at the musculotendinous junction and a Carroll tendon passing forcep was used to harvest one half of this tendon and bring it distally to the first dorsal compartment.  The abductor pollicis longus was split to its insertion on the thumb metacarpal, subsequently passed deep to the loop of palmaris longus at the base of the thumb metacarpal and brought to the drill hole dorsally to create a suspension plasty tendon graft.  The intermetacarpal ligament was adjusted with proper tension and the suspension plasty abductor pollicis longus slip was adjusted with proper tension followed by a Pulvertaft weave of the abductor pollicis longus free tail into the extensor carpi radialis longus dorsally.  This was secured with multiple mattress sutures of 3-0 Ethibond with proper tension to provide an excellent suspension of the thumb metacarpal off of the index metacarpal.  The free tails of the flexor carpi radialis were then used to dorsally transfer the remaining slip of the abductor pollicis longus for more abduction power at the base of the thumb metacarpal.  These were secured with Pulvertaft weaves and mattress sutures of 3-0 Ethibond.  All  wounds were thoroughly lavaged with sterile saline followed by triple antibiotic solution.  The tourniquet was released and hemostasis achieved.  The wounds were then repaired with intradermal 3-0 Prolene and Steri-Strips.  Compressive dressing was applied with dorsal palmar plaster sandwich splints maintaining the thumb in a palmar abducted position.  There were no apparent complications. DD:  06/07/00 TD:  06/08/00 Job: [id number]

## 2010-08-05 ENCOUNTER — Encounter: Payer: Self-pay | Admitting: Internal Medicine

## 2010-08-06 ENCOUNTER — Ambulatory Visit (INDEPENDENT_AMBULATORY_CARE_PROVIDER_SITE_OTHER): Payer: Medicare Other | Admitting: Internal Medicine

## 2010-08-06 ENCOUNTER — Encounter: Payer: Self-pay | Admitting: Internal Medicine

## 2010-08-06 DIAGNOSIS — E785 Hyperlipidemia, unspecified: Secondary | ICD-10-CM

## 2010-08-06 DIAGNOSIS — I10 Essential (primary) hypertension: Secondary | ICD-10-CM

## 2010-08-06 DIAGNOSIS — M353 Polymyalgia rheumatica: Secondary | ICD-10-CM

## 2010-08-06 DIAGNOSIS — I2510 Atherosclerotic heart disease of native coronary artery without angina pectoris: Secondary | ICD-10-CM

## 2010-08-06 DIAGNOSIS — J449 Chronic obstructive pulmonary disease, unspecified: Secondary | ICD-10-CM

## 2010-08-06 NOTE — Progress Notes (Signed)
  Subjective:    Patient ID: [name] [name], male    DOB: 1926-01-15, 77 y.o.   MRN: [id number]  HPI  77 year old patient who is seen today for followup. He has a history of coronary artery disease and COPD his cardiopulmonary status has been stable he has a history of nonsustained V. tach. He also has remote history of polymyalgia rheumatica. He seems to be doing fairly well no concerns or complaints. He is seen today for his 4 month followup    Review of Systems  Constitutional: Negative for fever, chills, appetite change and fatigue.  HENT: Negative for hearing loss, ear pain, congestion, sore throat, trouble swallowing, neck stiffness, dental problem, voice change and tinnitus.   Eyes: Negative for pain, discharge and visual disturbance.  Respiratory: Negative for cough, chest tightness, wheezing and stridor.   Cardiovascular: Negative for chest pain, palpitations and leg swelling.  Gastrointestinal: Negative for nausea, vomiting, abdominal pain, diarrhea, constipation, blood in stool and abdominal distention.  Genitourinary: Negative for urgency, hematuria, flank pain, discharge, difficulty urinating and genital sores.  Musculoskeletal: Negative for myalgias, back pain, joint swelling, arthralgias and gait problem.  Skin: Negative for rash.  Neurological: Negative for dizziness, syncope, speech difficulty, weakness, numbness and headaches.  Hematological: Negative for adenopathy. Does not bruise/bleed easily.  Psychiatric/Behavioral: Negative for behavioral problems and dysphoric mood. The patient is not nervous/anxious.        Objective:   Physical Exam  Constitutional: He is oriented to person, place, and time. He appears well-developed.  HENT:  Head: Normocephalic.  Right Ear: External ear normal.  Left Ear: External ear normal.  Eyes: Conjunctivae and EOM are normal.  Neck: Normal range of motion.  Cardiovascular: Normal rate and normal heart sounds.   Pulmonary/Chest: Breath  sounds normal.  Abdominal: Bowel sounds are normal.  Musculoskeletal: Normal range of motion. He exhibits edema. He exhibits no tenderness.       +1 ankle edema  Neurological: He is alert and oriented to person, place, and time.  Psychiatric: He has a normal mood and affect. His behavior is normal.          Assessment & Plan:   COPD. Stable Coronary artery disease. Asymptomatic hypertension controlled Cerebrovascular disease  We'll schedule annual exam Medications refilled

## 2010-08-06 NOTE — Patient Instructions (Signed)
Limit your sodium (Salt) intake    It is important that you exercise regularly, at least 20 minutes 3 to 4 times per week.  If you develop chest pain or shortness of breath seek  medical attention.  Return in 4 months for follow-up   

## 2010-09-06 ENCOUNTER — Emergency Department (HOSPITAL_COMMUNITY): Payer: Medicare Other

## 2010-09-06 ENCOUNTER — Inpatient Hospital Stay (HOSPITAL_COMMUNITY)
Admission: EM | Admit: 2010-09-06 | Discharge: 2010-09-15 | DRG: 242 | Disposition: A | Discharge status: Skilled Nursing Facility | Payer: Medicare Other | Attending: Internal Medicine | Admitting: Internal Medicine

## 2010-09-06 DIAGNOSIS — Z7982 Long term (current) use of aspirin: Secondary | ICD-10-CM

## 2010-09-06 DIAGNOSIS — I442 Atrioventricular block, complete: Principal | ICD-10-CM | POA: Diagnosis present

## 2010-09-06 DIAGNOSIS — R55 Syncope and collapse: Secondary | ICD-10-CM

## 2010-09-06 DIAGNOSIS — F0390 Unspecified dementia without behavioral disturbance: Secondary | ICD-10-CM | POA: Diagnosis present

## 2010-09-06 DIAGNOSIS — I2510 Atherosclerotic heart disease of native coronary artery without angina pectoris: Secondary | ICD-10-CM | POA: Diagnosis present

## 2010-09-06 DIAGNOSIS — Z7902 Long term (current) use of antithrombotics/antiplatelets: Secondary | ICD-10-CM

## 2010-09-06 DIAGNOSIS — E785 Hyperlipidemia, unspecified: Secondary | ICD-10-CM | POA: Diagnosis present

## 2010-09-06 DIAGNOSIS — R404 Transient alteration of awareness: Secondary | ICD-10-CM | POA: Diagnosis present

## 2010-09-06 DIAGNOSIS — E871 Hypo-osmolality and hyponatremia: Secondary | ICD-10-CM | POA: Diagnosis present

## 2010-09-06 DIAGNOSIS — Z8673 Personal history of transient ischemic attack (TIA), and cerebral infarction without residual deficits: Secondary | ICD-10-CM

## 2010-09-06 DIAGNOSIS — K219 Gastro-esophageal reflux disease without esophagitis: Secondary | ICD-10-CM | POA: Diagnosis present

## 2010-09-06 DIAGNOSIS — J449 Chronic obstructive pulmonary disease, unspecified: Secondary | ICD-10-CM | POA: Diagnosis present

## 2010-09-06 DIAGNOSIS — N400 Enlarged prostate without lower urinary tract symptoms: Secondary | ICD-10-CM | POA: Diagnosis present

## 2010-09-06 DIAGNOSIS — I10 Essential (primary) hypertension: Secondary | ICD-10-CM | POA: Diagnosis present

## 2010-09-06 DIAGNOSIS — J4489 Other specified chronic obstructive pulmonary disease: Secondary | ICD-10-CM | POA: Diagnosis present

## 2010-09-06 DIAGNOSIS — J9600 Acute respiratory failure, unspecified whether with hypoxia or hypercapnia: Secondary | ICD-10-CM | POA: Diagnosis present

## 2010-09-06 DIAGNOSIS — M353 Polymyalgia rheumatica: Secondary | ICD-10-CM | POA: Diagnosis present

## 2010-09-06 LAB — DIFFERENTIAL
Basophils Absolute: 0 10*3/uL (ref 0.0–0.1)
Eosinophils Relative: 3 % (ref 0–5)
Lymphocytes Relative: 20 % (ref 12–46)
Lymphs Abs: 0.9 10*3/uL (ref 0.7–4.0)
Monocytes Relative: 7 % (ref 3–12)

## 2010-09-06 LAB — CK TOTAL AND CKMB (NOT AT ARMC): Total CK: 59 U/L (ref 7–232)

## 2010-09-06 LAB — BASIC METABOLIC PANEL
CO2: 28 mEq/L (ref 19–32)
Calcium: 8.6 mg/dL (ref 8.4–10.5)
Creatinine, Ser: 0.84 mg/dL (ref 0.50–1.35)
Glucose, Bld: 153 mg/dL — ABNORMAL HIGH (ref 70–99)

## 2010-09-06 LAB — CBC
Hemoglobin: 11.7 g/dL — ABNORMAL LOW (ref 13.0–17.0)
RDW: 13.2 % (ref 11.5–15.5)

## 2010-09-06 LAB — APTT: aPTT: 26 seconds (ref 24–37)

## 2010-09-06 LAB — TROPONIN I: Troponin I: <0.3 ng/mL (ref <0.30)

## 2010-09-07 ENCOUNTER — Inpatient Hospital Stay (HOSPITAL_COMMUNITY): Payer: Medicare Other

## 2010-09-07 DIAGNOSIS — R55 Syncope and collapse: Secondary | ICD-10-CM

## 2010-09-07 DIAGNOSIS — I442 Atrioventricular block, complete: Secondary | ICD-10-CM

## 2010-09-07 HISTORY — PX: INSERT / REPLACE / REMOVE PACEMAKER: SUR710

## 2010-09-07 LAB — PROTIME-INR
INR: 1.13 (ref 0.00–1.49)
Prothrombin Time: 14.7 seconds (ref 11.6–15.2)

## 2010-09-07 LAB — CBC
Platelets: 167 10*3/uL (ref 150–400)
RBC: 4 MIL/uL — ABNORMAL LOW (ref 4.22–5.81)
WBC: 7.4 10*3/uL (ref 4.0–10.5)

## 2010-09-07 LAB — POCT I-STAT 3, ART BLOOD GAS (G3+)
Bicarbonate: 24.7 mEq/L — ABNORMAL HIGH (ref 20.0–24.0)
TCO2: 26 mmol/L (ref 0–100)
pCO2 arterial: 48.6 mmHg — ABNORMAL HIGH (ref 35.0–45.0)
pH, Arterial: 7.314 — ABNORMAL LOW (ref 7.350–7.450)

## 2010-09-07 LAB — BASIC METABOLIC PANEL
CO2: 27 mEq/L (ref 19–32)
Chloride: 104 mEq/L (ref 96–112)
Sodium: 139 mEq/L (ref 135–145)

## 2010-09-08 ENCOUNTER — Inpatient Hospital Stay (HOSPITAL_COMMUNITY): Payer: Medicare Other

## 2010-09-08 DIAGNOSIS — R4182 Altered mental status, unspecified: Secondary | ICD-10-CM

## 2010-09-08 DIAGNOSIS — G309 Alzheimer's disease, unspecified: Secondary | ICD-10-CM

## 2010-09-08 DIAGNOSIS — F05 Delirium due to known physiological condition: Secondary | ICD-10-CM

## 2010-09-08 DIAGNOSIS — J9600 Acute respiratory failure, unspecified whether with hypoxia or hypercapnia: Secondary | ICD-10-CM

## 2010-09-08 DIAGNOSIS — Z9911 Dependence on respirator [ventilator] status: Secondary | ICD-10-CM

## 2010-09-08 DIAGNOSIS — F0280 Dementia in other diseases classified elsewhere without behavioral disturbance: Secondary | ICD-10-CM

## 2010-09-08 LAB — BASIC METABOLIC PANEL
BUN: 17 mg/dL (ref 6–23)
CO2: 29 mEq/L (ref 19–32)
Chloride: 102 mEq/L (ref 96–112)
Creatinine, Ser: 0.94 mg/dL (ref 0.50–1.35)
GFR calc Af Amer: >60 mL/min (ref >60)
Glucose, Bld: 110 mg/dL — ABNORMAL HIGH (ref 70–99)

## 2010-09-08 LAB — HEPATIC FUNCTION PANEL
ALT: 13 U/L (ref 0–53)
AST: 30 U/L (ref 0–37)
Bilirubin, Direct: 0.2 mg/dL (ref 0.0–0.3)

## 2010-09-08 LAB — CBC
HCT: 36.9 % — ABNORMAL LOW (ref 39.0–52.0)
MCV: 88.3 fL (ref 78.0–100.0)
Platelets: 139 10*3/uL — ABNORMAL LOW (ref 150–400)
RBC: 4.18 MIL/uL — ABNORMAL LOW (ref 4.22–5.81)
WBC: 8.7 10*3/uL (ref 4.0–10.5)

## 2010-09-09 ENCOUNTER — Inpatient Hospital Stay (HOSPITAL_COMMUNITY): Payer: Medicare Other

## 2010-09-09 DIAGNOSIS — F05 Delirium due to known physiological condition: Secondary | ICD-10-CM

## 2010-09-09 LAB — CBC
HCT: 39.4 % (ref 39.0–52.0)
MCH: 30.4 pg (ref 26.0–34.0)
MCHC: 34.5 g/dL (ref 30.0–36.0)
MCV: 88.1 fL (ref 78.0–100.0)
RDW: 13.2 % (ref 11.5–15.5)

## 2010-09-09 LAB — BASIC METABOLIC PANEL
BUN: 15 mg/dL (ref 6–23)
Creatinine, Ser: 0.84 mg/dL (ref 0.50–1.35)
GFR calc Af Amer: >60 mL/min (ref >60)
GFR calc non Af Amer: >60 mL/min (ref >60)
Glucose, Bld: 138 mg/dL — ABNORMAL HIGH (ref 70–99)

## 2010-09-09 LAB — POCT I-STAT 3, ART BLOOD GAS (G3+)
Acid-Base Excess: 1 mmol/L (ref 0.0–2.0)
Bicarbonate: 25.7 mEq/L — ABNORMAL HIGH (ref 20.0–24.0)
O2 Saturation: 90 %
Patient temperature: 98.6
TCO2: 27 mmol/L (ref 0–100)

## 2010-09-11 ENCOUNTER — Encounter: Payer: Self-pay | Admitting: Internal Medicine

## 2010-09-11 DIAGNOSIS — F05 Delirium due to known physiological condition: Secondary | ICD-10-CM

## 2010-09-11 LAB — BASIC METABOLIC PANEL
BUN: 20 mg/dL (ref 6–23)
Calcium: 9.2 mg/dL (ref 8.4–10.5)
Creatinine, Ser: 0.84 mg/dL (ref 0.50–1.35)
GFR calc Af Amer: >60 mL/min (ref >60)

## 2010-09-11 NOTE — Consult Note (Signed)
NAMEJOHSUA, SHEVLIN                 ACCOUNT NO.:  [id number]  MEDICAL RECORD NO.:  [id number]  LOCATION:  3733                         FACILITY:  [facility]  PHYSICIAN:  Levie Heritage, MD       DATE OF BIRTH:  16-Apr-1925  DATE OF CONSULTATION:  09/09/2010 DATE OF DISCHARGE:                                CONSULTATION   REASON FOR CONSULTATION:  Altered mental status, status post procedure.  HISTORY OF PRESENT ILLNESS:  This is an 77 year old male with past medical history of hypertension, hypercholesterolemia, cataracts status post surgery, COPD, CAD status post PCI in 2009, CVA in 2008, GERD, dementia with aggressive behavior, BPH status post TURP, nonsustained V- tach.  The patient was admitted to the hospital on September 06, 2010, for a pacemaker placement.  Postprocedure, the patient became extremely aggressive and was given a significant amount of Haldol and Ativan for his agitation.  Secondary to the administration of Haldol and Ativan, the patient's airway became compromised and the patient needed intubation.  The patient was intubated for less than 24 hours.  Post extubation, the patient appeared to be doing well per family members. The patient remained confused post extubation, but did not show any significant aggressive behavior.  The nurse who was then taking care of the patient did notice that the patient had a hoarse voice, but appeared to be taking his pills without any difficulty.  Today on September 09, 2010, the patient was noted to become aggressive towards his wife and staff requiring Haldol.  In addition, the patient also had questionable difficulty taking his pills versus questionable refusal to take his medication.  Due to these attributes in his behavior, Neurology was consulted for evaluation and recommendations.  PAST MEDICAL HISTORY:  As noted above.  MEDICATIONS:  At home, the patient is on aspirin, pravastatin, Aricept, Plavix, Zantac.  While he has been in the  hospital, he has been given Risperdal 0.5 mg b.i.d., Haldol 2 mg p.o. q.6 h. p.r.n. Ativan 1-2 mg q.3 h. p.r.n. IV.  ALLERGIES:  DEMEROL and LIPITOR.  SOCIAL HISTORY:  The patient quit smoking in 1984.  He does not drink. He lives with his wife.  He is retired.  REVIEW OF SYSTEMS:  Negligible with the exception of above.  PHYSICAL EXAMINATION:  VITAL SIGNS:  Blood pressure is 134/82, pulse 85, respiration 18, temperature 98.1. MENTAL STATUS:  The patient was sleeping upon entering the room, however, he awoken very easily with tactile stimuli.  He was able to tell me it was June 2012 and he recognized his wife and his daughter who is at his bedside.  Pupils are equal, round, and reactive to light and accommodating.  Has conjugate gaze.  Extraocular movements are intact. Visual fields are intact.  Face was symmetrical.  Tongue was midline. Uvula is midline.  The patient did not have his dentures in at this time and per family members starting today his dentures would not fit into his mouth.  His mouth was extremely dry causing some dysarthria.  Facialsensation was full to pinprick, light touch.  Shoulder shrug and head turn was within normal limits. COORDINATION:  Finger-to-nose is smooth.  Heel-to-shin  was smooth. MOTOR:  The patient showed 5/5 strength throughout and moved his extremities purposefully and spontaneously without tremor or increased tone.  Deep tendon reflexes were 2+ in the upper extremities, 1+ in the patellas, negligible at the Achilles, and downgoing toes bilaterally. DRIFT:  The patient showed no drift in the upper extremities or lower extremities.  Sensation was full to pinprick, light touch bilaterally in the upper and lower extremities. PULMONARY:  Clear to auscultation. CARDIOVASCULAR:  S1 and S2 is audible. NECK:  Negative bruits.  LABORATORY DATA:  AST 30, ALT is 13.  Sodium 141, potassium 3.3, chloride 104, CO2 of 26, BUN 15, creatinine 0.84, glucose 138.   White blood cell count is 11.5, platelets 170, hemoglobin 13.6, hematocrit 39.4.  IMAGING:  Head CT is pending.  ASSESSMENT:  This is an 77 year old male with a known 5- to 10-year history of dementia with some prominent behavioural features like mood fluctuations with good and bad days. Overall condiiton is worsening now with physical and psychological stressors of hospitalization.  At this time, the overall impression favors frontotemporal dementia more than any other neurodegenerative disorder. The patient will need a detailed outpatient based neuropsychology testing for further evaluation.  At this time, his exam is nonfocal and doubt any abnormalities will be seen on CT of head.  We will change the patient's medication as follows: 1. We will discontinue his Aricept. 2. Start Exelon patch 4.6 mg daily.  We will start Seroquel one-half     tablet of 25 mg p.o. at bedtime.  If the patient is unable to take     oral meds.  We have written an order for an alternative, Geodon 5 mg     IM q.8 h. p.r.n. to be given.  We will discontinue the Ativan and     we will discontinue Haldol.  I have also written to add 0.5 mg of     clonazepam p.o. daily.  Once he is back to his familiar envirment (home), klonopin can be stopped.  Dr. Hoy Morn has seen and evaluated the patient and agrees with the above- mentioned.     Felicie Morn, PA-C  I have examined the patient and agree with above clinical finidngs. ______________________________ Levie Heritage, MD    DS/MEDQ  D:  09/09/2010  T:  09/10/2010  Job:  [id number]  Electronically Signed by Felicie Morn PA-C on 09/11/2010 07:43:17 AM Electronically Signed by Levie Heritage MD on 09/11/2010 09:34:25 AM

## 2010-09-14 LAB — BASIC METABOLIC PANEL
Calcium: 9.1 mg/dL (ref 8.4–10.5)
Creatinine, Ser: 1.05 mg/dL (ref 0.50–1.35)
GFR calc non Af Amer: >60 mL/min (ref >60)
Glucose, Bld: 97 mg/dL (ref 70–99)
Sodium: 147 mEq/L — ABNORMAL HIGH (ref 135–145)

## 2010-09-15 DIAGNOSIS — F05 Delirium due to known physiological condition: Secondary | ICD-10-CM

## 2010-09-15 NOTE — Op Note (Signed)
  NAMEAVRUM, [name] [name]                 ACCOUNT NO.:  [id number]  MEDICAL RECORD NO.:  [id number]  LOCATION:  2901                         FACILITY:  [facility]  PHYSICIAN:  Duke Salvia, MD, FACCDATE OF BIRTH:  21-Aug-1925  DATE OF PROCEDURE:  09/07/2010 DATE OF DISCHARGE:                              OPERATIVE REPORT   PREOPERATIVE DIAGNOSIS:  Intermittent complete heart block.  POSTOPERATIVE DIAGNOSIS:  Intermittent complete heart block.  PROCEDURE:  Dual-chamber pacemaker implantation.  Following obtaining informed consent, the patient was brought to the electrophysiology laboratory along with his wife.  After routine prep and drape, his wife was present for the entire case.  Because of his dementia, lidocaine was infiltrated in the prepectoral subclavicular region.  An incision was made and carried down to the layer of the prepectoral fascia using electrocautery and sharp dissection.  A pocket was formed similarly.  Hemostasis was obtained.  Thereafter, attention was turned to gaining access to the extrathoracic left subclavian vein which was accomplished without difficulty without the aspiration or venipunctures of the artery.  Two separate venipunctures were accomplished.  Guidewires were placed and retained and sequentially, 7-French sheaths were placed through which were passed a St. Jude 2088 58-cm active-fixation ventricular lead, serial number [id number] and a St. Jude 2088 TC 52-cm lead, serial number [id number]. Please note that the ventricular lead was placed, secured, and marked with a tie prior to the insertion of the atrial lead.  The ventricular lead was moved to the right ventricular apex where the bipolar R-wave was 8.5 with a pace impedance of 1.3 V at 0.4 milliseconds shortly after screw deployment, impedance was 996 and current at threshold was 1.1 mA. There was no diaphragmatic pacing at 10 V and the current of injury was brisk.  Atrial lead was manipulated to  the right atrial appendage where the bipolar P-wave was 1.4.  Again shortly after screw deployment, threshold was 1.1 V at 0.4 milliseconds, impedance was 456 ohms and the current of threshold was 1.9 mA.  Again, there was no diaphragmatic pacing at 10 V and the current of injury was brisk.  These leads were secured to the prepectoral fascia and attached to a St. Jude Accent DRRF pulse generator, model PM 2210, serial number [id number].  P-synchronous pacing was identified.  The pockets were copiously irrigated with antibiotic-containing saline solution.  Hemostasis was assured.  Leads and the pulse generator were placed in the pocket, secured to the prepectoral fascia, and the wound was closed in 3 layers in normal fashion.  The wound was washed, dried, and Benzoin and Steri- Strip dressing was applied.  Needle counts, sponge counts, and instrument counts were correct at the end of the procedure according to the staff.  The patient tolerated the procedure without apparent complication.     Duke Salvia, MD, [facility]     SCK/MEDQ  D:  09/07/2010  T:  09/08/2010  Job:  [id number]  Electronically Signed by Sherryl Manges MD [facility] on 09/15/2010 04:39:35 PM

## 2010-09-23 ENCOUNTER — Other Ambulatory Visit: Payer: Self-pay | Admitting: *Deleted

## 2010-09-23 ENCOUNTER — Ambulatory Visit (INDEPENDENT_AMBULATORY_CARE_PROVIDER_SITE_OTHER): Payer: Medicare Other | Admitting: *Deleted

## 2010-09-23 ENCOUNTER — Other Ambulatory Visit (INDEPENDENT_AMBULATORY_CARE_PROVIDER_SITE_OTHER): Payer: Medicare Other | Admitting: *Deleted

## 2010-09-23 DIAGNOSIS — I442 Atrioventricular block, complete: Secondary | ICD-10-CM

## 2010-09-23 DIAGNOSIS — Z79899 Other long term (current) drug therapy: Secondary | ICD-10-CM

## 2010-09-23 LAB — PACEMAKER DEVICE OBSERVATION
AL IMPEDENCE PM: 362.5 Ohm
AL THRESHOLD: 0.75 V
BAMS-0003: 70 {beats}/min
DEVICE MODEL PM: 7241194
RV LEAD AMPLITUDE: 12 mv

## 2010-09-24 LAB — CBC WITH DIFFERENTIAL/PLATELET
Basophils Absolute: 0 10*3/uL (ref 0.0–0.1)
Eosinophils Absolute: 0.3 10*3/uL (ref 0.0–0.7)
HCT: 32.2 % — ABNORMAL LOW (ref 39.0–52.0)
Hemoglobin: 11 g/dL — ABNORMAL LOW (ref 13.0–17.0)
Lymphs Abs: 1.1 10*3/uL (ref 0.7–4.0)
MCHC: 34 g/dL (ref 30.0–36.0)
MCV: 91.8 fl (ref 78.0–100.0)
Monocytes Absolute: 0.5 10*3/uL (ref 0.1–1.0)
Monocytes Relative: 10.4 % (ref 3.0–12.0)
Neutro Abs: 3 10*3/uL (ref 1.4–7.7)
RDW: 13.9 % (ref 11.5–14.6)

## 2010-09-24 LAB — HEPATIC FUNCTION PANEL
AST: 30 U/L (ref 0–37)
Albumin: 3.6 g/dL (ref 3.5–5.2)

## 2010-09-24 LAB — BASIC METABOLIC PANEL
CO2: 27 mEq/L (ref 19–32)
Chloride: 102 mEq/L (ref 96–112)
Glucose, Bld: 121 mg/dL — ABNORMAL HIGH (ref 70–99)
Potassium: 4 mEq/L (ref 3.5–5.1)
Sodium: 142 mEq/L (ref 135–145)

## 2010-10-01 ENCOUNTER — Telehealth: Payer: Self-pay | Admitting: Cardiovascular Disease

## 2010-10-01 NOTE — Telephone Encounter (Signed)
Test results

## 2010-10-01 NOTE — Discharge Summary (Signed)
[name] [name], [name] [name] NO.:  [id number]  MEDICAL RECORD NO.:  [id number]  LOCATION:  3702                         FACILITY:  [facility]  PHYSICIAN:  Duke Salvia, MD, FACCDATE OF BIRTH:  07/16/1925  DATE OF ADMISSION:  09/06/2010 DATE OF DISCHARGE:  09/15/2010                        DISCHARGE SUMMARY - REFERRING   PRIMARY CARE PHYSICIAN:  [name], MD  PRIMARY CARDIOLOGIST:  Dr. Charlies Constable in the past.  Now is Dr. Verne Carrow.  ELECTROPHYSIOLOGIST:  Duke Salvia MD, [facility].  PRIMARY DIAGNOSIS:  Syncope with documented complete heart block.  SECONDARY DIAGNOSES: 1. Dementia with acute delirium, status post procedure. 2. Hypertension. 3. Coronary artery disease, status post PCI, circumflex in 2009. 4. Chronic obstructive pulmonary disease. 5. Right-sided cerebrovascular accident in 2008. 6. Gastroesophageal reflux disease. 7. Benign prostatic hypertrophy, status post TURP. 8. Polymyalgia rheumatica. 9. Hyperlipidemia. 10.History of nonsustained ventricular tachycardia. 11.Carotid artery disease, status post CEA. 12.Status post cataract surgery.  PROCEDURES DURING ADMISSION: 1. Echocardiogram on September 07, 2010 demonstrating an ejection fraction     of 60% to 65%. 2. Implantation of the dual chamber pacemaker on September 08, 2010.  The     patient received [employer]. [employer] model [id number] pacemaker with model     [id number] right atrial and right ventricular leads.  The patient had     no early apparent complications. 3. Chest x-ray postprocedure demonstrates no pneumothorax. 4. CT of the head on September 09, 2010 demonstrated no acute intracranial findings.  ALLERGIES:  The patient is allergic to DEMEROL.  BRIEF HPI:  [name] [name] is an 77 year old male with a history of syncope and dementia.  He has initial episode of syncope in 2009 and at that time, it was felt to be secondary to ventricular tachycardia.  The patient underwent PCI to the  circumflex artery at that time and had no recurrence of symptoms for several years.  His syncope recurred in October 2011.  Workup at that time included nuclear stress test, which was normal.  The patient has not had any other further syncopal spells until the morning of admission.  At that time, the patient was eating breakfast and has an episode of loss of consciousness.  EMS was called, the patient was transferred to [facility] for evaluation.  In emergency room, the patient did have a presyncopal episode with a pause noted of 8 seconds on telemetry.  The patient was admitted for evaluation.  HOSPITAL COURSE:  The patient was admitted on September 06, 2010 for evaluation of syncopal spell.  Telemetry demonstrated intermittent complete heart block with significant pauses up to 8 seconds.  He was evaluated by Dr. Graciela Husbands, with electrophysiology who recommended pacemaker implant.  Because of the patient's confusion, no meds were given during the procedure.  The patient had no early apparent complications with pacemaker implant.  However on the day from the evening of post implant, the patient had acute delirium, was increasingly agitated and combative. Because of these things, he was intubated for sedation.  The patient was subsequently extubated.  During this hospitalization, he was evaluated by Psychiatry and Neurology who recommended changing his medications. These will be continued on  discharge.  The patient also had a physical therapy and occupational therapy consult during the hospitalization and skilled nursing facility was recommended.  The patient continued to be monitored on September 15, 2010 and was considered stable for discharge as he has achieved maximum benefit from this hospitalization.  Per Dr. Graciela Husbands, plans for the patient to be discharged to [facility].  FOLLOWUP APPOINTMENT: 1. [facility] on September 23, 2010 at 4 p.m. 2. Dr. Graciela Husbands in December 24, 2010 at  1:45 p.m. 3. Dr. Clifton James is scheduled. 4. Dr. Amador Cunas is scheduled.  DISCHARGE INSTRUCTIONS: 1. Increase activity slowly. 2. Follow a low-sodium, heart-healthy diet. 3. No lifting more than 10 pounds for 4 weeks with left arm.  DISCHARGE MEDICATIONS: 1. Clonazepam 0.5 mg one-half tablet daily. 2. Depakote 250 mg daily. 3. Labetalol 100 mg twice daily. 4. Nitroglycerin 0.4 mg as needed for chest pain. 5. Seroquel 12.5 mg daily at bedtime. 6. Exelon patch 4.6 mg transdermally daily. 7. Geodon 5 mg IM every 8 hours as needed. 8. Aspirin 81 mg daily. 9. Plavix 75 mg daily. 10.Pravastatin 20 mg daily. 11.Zantac 150 mg daily. 12.Patient's donepezil was discontinued at this admission.  DISPOSITION:  The patient was seen and examined by Dr. Graciela Husbands on September 15, 2010 and considered stable for discharge to skilled nursing facility.  DURATION OF DISCHARGE ENCOUNTER:  45 minutes.     Gypsy Balsam, RN,BSN   ______________________________ Duke Salvia, MD, [facility]    AS/MEDQ  D:  09/15/2010  T:  09/15/2010  Job:  [id number]  cc:   Verne Carrow, MD [name], MD  Electronically Signed by Gypsy Balsam RNBSN on 09/17/2010 11:50:46 AM Electronically Signed by Sherryl Manges MD [facility] on 10/01/2010 02:12:56 PM

## 2010-10-01 NOTE — Telephone Encounter (Signed)
Spoke with patient's wife. She is aware that his blood work was normal.

## 2010-10-05 NOTE — H&P (Signed)
[name], [name] NO.:  [id number]  MEDICAL RECORD NO.:  [id number]  LOCATION:  2919                         FACILITY:  [facility]  PHYSICIAN:  Hillis Range, MD       DATE OF BIRTH:  June 22, 1925  DATE OF ADMISSION:  09/06/2010 DATE OF DISCHARGE:                             HISTORY & PHYSICAL   PRIMARY CARDIOLOGIST:  He has seen Dr. Charlies Constable, but now will be followed by Dr. Verne Carrow.  PRIMARY CARE PHYSICIAN:  [name], MD  REASON FOR ADMISSION:  Syncope with documented pauses greater than 8 seconds.  HISTORY OF PRESENT ILLNESS:  This is an 77 year old Caucasian gentleman who is mildly demented with history of syncope.  Initial episode was in 2009.  It was felt to be secondary to ventricular tachycardia and the patient underwent PCI of the circumflex with a drug-eluting stent. Since that time, he has had several other episodes of syncope with the last noted one in October 2011.  Workup at this time included nuclear stress test which was normal showing ejection fraction of 58%.  The patient was also weaned off his Coreg.  Since that time, the patient denies any syncopal episodes.  His wife has not witnessed any syncopal episodes since that time.  This morning, the patient was eating breakfast and had an episode of loss of consciousness.  The wife is unsure of how long the patient was out for, but it was a short amount of time.  The patient came to feeling like his usual state.  He denies any presyncopal symptoms of lightheadedness, nausea, vomiting, palpitations, or chest pain.  The patient also denies symptoms after the event.  He had no speech deficits or loss of bowel.  His wife became concerned and called the EMS.  Since being brought to the emergency department, the patient has had no further episodes of syncope.  He did have a presyncopal episode with a noted greater than 8-second pause on telemetry.  Since that time, he has had  several extended pauses on telemetry.  The patient denies any recent fevers or chills.  He denies shortness of breath or dyspnea on exertion.  He is positive for intermittent lower extremity edema.  He denies any nausea or vomiting.  The patient is currently hemodynamically stable.  His EKG at this time shows sinus bradycardia at a rate of 57 beats per minute.  Axis is normal.  PR interval is 200, QRS 82, QTc 440.  No acute ST-T wave changes. Telemetry currently shows sinus rhythm.  PAST MEDICAL HISTORY: 1. Hypertension. 2. Coronary artery disease, status post PCI to the circumflex in 2009. 3. COPD. 4. CVA in 2008, right sided. 5. Gastroesophageal reflux disease. 6. Dementia. 7. BPH, status post TURP. 8. Polymyalgia rheumatica. 9. Hyperlipidemia. 10.History of nonsustained ventricular tachycardia. 11.Coronary artery disease, status post right CEA.     a.     Followup ultrasound in May 2012 demonstrating widely patent      right carotid endarterectomy with mild hyperplasia.  1-39%      plaquing of left internal carotid artery.  Bilateral vertebral      arteries are antegrade. 12.Status  post cataract surgery. 13.Status post nuclear stress test in October 2011.  This was normal     with an ejection fraction of 58%.  SOCIAL HISTORY:  The patient lives at home with his wife.  He is retired since 1984.  He has a history of tobacco abuse, but quit in 1984.  He continues to smoke cigars daily, but notes that he does not inhale.  The patient denies alcohol use.  FAMILY HISTORY:  Noncontributory for premature coronary artery disease. His mother died at the age of 30 from unknown causes.  His father died at the age of 12 from prostate cancer.  He has 2 sisters who passed away from diabetes complications.  He has a brother who died from unknown causes.  ALLERGIES: 1. DEMEROL. 2. LIPITOR.  HOME MEDICATIONS: 1. Aspirin 325 mg daily. 2. Pravastatin 20 mg daily. 3. Aricept 5 mg  daily. 4. Plavix 75 mg daily. 5. Zantac 150 mg daily.  REVIEW OF SYSTEMS:  All pertinent positives and negatives as stated in the HPI.  Other systems have been reviewed and are negative.  PHYSICAL EXAMINATION:  VITAL SIGNS:  Temperature 98.6, pulse 30s-60s, respirations 12, blood pressure 136-178/85-89, and O2 saturation 100% on 2 liters. GENERAL:  This is a well-developed, well-nourished elderly gentleman. He is no acute distress. HEENT::  Normal. NECK:  Supple without bruit. HEART:  Regular rate and rhythm with S1 and S2.  No murmur, rub, or gallop noted.  PMI is normal.  Pulses are 2+ and equal bilaterally. LUNGS:  Occasional right-sided rhonchi.  Otherwise, lungs are clear throughout with good air movement. ABDOMEN:  Soft, nontender, positive bowel sounds x4. EXTREMITIES:  No clubbing, cyanosis, or edema. MUSCULOSKELETAL:  No joint deformity or effusion. NEURO:  Alert and oriented x3.  Cranial nerves II-XII grossly intact.  Chest x-ray showing low lung volumes with basilar streaky opacities that favor atelectasis and peribronchial thickening.  EKG showing sinus bradycardia at a rate of 57 beats per minute.  There are no acute ST-T wave changes.  There is borderline AV conduction delay with a PR interval of 200 milliseconds.  LABORATORY DATA:  WBC 4.7, hemoglobin 11.7, hematocrit 34.6, and platelet 121.  Sodium 142, potassium 4, chloride 107, bicarb 28, BUN 11, creatinine 0.84, INR 1.05.  Cardiac enzymes negative x1.  ASSESSMENT/PLAN:  This is an 77 year old Caucasian gentleman with history of coronary artery disease and dementia who presents with syncope this morning while seated.  It has been noted in the emergency department that the patient has had 8-second pauses with presyncope. The patient is now with sick sinus syndrome with pause-dependent syncope without notable reversible causes.  At this time, we will plan to admit the patient to the Step-Down Unit.  Zoll pads  are in place and this will be continued.  We will check a 2-D echocardiogram as well as thyroid function test to rule out other etiologies.  But at this time it appears the patient would benefit from a pacemaker implantation.  Risks, benefits, and alternatives have been discussed with the patient and his family and they agreed to proceed.  At this time, the patient will be made n.p.o. after midnight for pacemaker implantation in the morning. Further treatment will be dependent upon the above.     Leonette Monarch, PA-C   ______________________________ Hillis Range, MD    NB/MEDQ  D:  09/06/2010  T:  09/07/2010  Job:  [id number]  Electronically Signed by Alen Blew P.A. on 09/17/2010 08:65:78 PM Electronically  Signed by Hillis Range MD on 10/05/2010 10:02:40 AM

## 2010-10-14 ENCOUNTER — Other Ambulatory Visit: Payer: Self-pay | Admitting: Internal Medicine

## 2010-11-03 ENCOUNTER — Telehealth: Payer: Self-pay | Admitting: Internal Medicine

## 2010-11-03 MED ORDER — CLONAZEPAM 0.5 MG PO TABS: 0.5000 mg | ORAL_TABLET | Freq: Every day | ORAL | Status: DC

## 2010-11-03 MED ORDER — DIVALPROEX SODIUM 250 MG PO DR TAB: 250.0000 mg | DELAYED_RELEASE_TABLET | Freq: Every day | ORAL | Status: DC

## 2010-11-03 MED ORDER — LABETALOL HCL 100 MG PO TABS: 100.0000 mg | ORAL_TABLET | Freq: Two times a day (BID) | ORAL | Status: DC

## 2010-11-03 MED ORDER — RIVASTIGMINE 4.6 MG/24HR TD PT24: 1.0000 | MEDICATED_PATCH | Freq: Every day | TRANSDERMAL | Status: DC

## 2010-11-03 MED ORDER — QUETIAPINE 12.5 MG HALF TABLET: 25.0000 mg | ORAL_TABLET | Freq: Every day | ORAL | Status: DC

## 2010-11-03 NOTE — Telephone Encounter (Signed)
All meds faxed back to [facility]

## 2010-11-03 NOTE — Telephone Encounter (Signed)
Pt's wife called. Would like refill on Depakote, and Exelon patch, Seroquel, Klonopin, and Labetalol. Please call in to the [facility] on [address] [facility]. He will be out after tomorrow.

## 2010-11-04 ENCOUNTER — Encounter: Payer: Self-pay | Admitting: Internal Medicine

## 2010-11-06 ENCOUNTER — Encounter: Payer: Self-pay | Admitting: Internal Medicine

## 2010-11-06 ENCOUNTER — Ambulatory Visit (INDEPENDENT_AMBULATORY_CARE_PROVIDER_SITE_OTHER): Payer: Medicare Other | Admitting: Internal Medicine

## 2010-11-06 DIAGNOSIS — J449 Chronic obstructive pulmonary disease, unspecified: Secondary | ICD-10-CM

## 2010-11-06 DIAGNOSIS — R413 Other amnesia: Secondary | ICD-10-CM

## 2010-11-06 DIAGNOSIS — Z0000 Encounter for general adult medical examination without abnormal findings: Secondary | ICD-10-CM

## 2010-11-06 DIAGNOSIS — I2510 Atherosclerotic heart disease of native coronary artery without angina pectoris: Secondary | ICD-10-CM

## 2010-11-06 NOTE — Patient Instructions (Signed)
Limit your sodium (Salt) intake    It is important that you exercise regularly, at least 20 minutes 3 to 4 times per week.  If you develop chest pain or shortness of breath seek  medical attention.  Return in 3 months for follow-up  

## 2010-11-06 NOTE — Progress Notes (Signed)
Subjective:    Patient ID: [name] [name], male    DOB: 1925-08-17, 77 y.o.   MRN: [id number]  HPI  is an 77 year old patient who is seen today for followup. He has had a recent hospitalization for syncope and complete heart block. He was discharged on July 3 and spent 3 weeks at a rehabilitation facility. He has been home for one month and has done quite well the Hospital this was complicated by acute confusion. He does have baseline mild to severe dementia since he has been home he has done quite well. Hospital records were reviewed.   Here for Medicare AWV:  1. Risk factors based on Past M, S, F history: patient has known coronary artery disease  2. Physical Activities: no major activity restrictions  3. Depression/mood: no history of depression. Does have moderate cognitive impairment  4. Hearing: no deficits  5. ADL's: independent in all aspects of daily living  6. Fall Risk: mild to moderate due to age and arthritis  7. Home Safety: no problems identified  8. Height, weight, &visual acuity:height and weight stable. No change in visual acuity  9. Counseling: heart healthy diet. Encouraged  10. Labs ordered based on risk factors: laboratory profile, including lipid profile will be reviewed  11. Referral Coordination- will continue follow-up with cardiology  12. Care Plan- heart healthy diet. An active lifestyle encouraged  13. Cognitive Assessment- moderate impairment- will continue on Aricept   Hypertension History:  Positive major cardiovascular risk factors include male age 26 years old or older, hyperlipidemia, hypertension, and current tobacco user.  Positive history for target organ damage include ASHD (either angina/prior MI/prior CABG) and prior stroke (or TIA).   Allergies:  1) ! Lipitor  2) Demerol (Meperidine Hcl)   Past History:  Past Medical History:  Reviewed history from 01/13/2009 and no changes required.  History of syncope status post pacemaker insertion for  complete heart block June 2012 GERD  Benign prostatic hypertrophy  Cerebrovascular accident, hx of right brain stroke in 2008  polymyalgia rheumatica  Allergic rhinitis  COPD  Colonic polyps, hx of  Hyperlipidemia  memory loss  Coronary artery disease  1. Recent admission for a syncopal episode possibly related to  ischemic arrhythmia.  2. Coronary artery disease status post stenting of a lesion in the  marginal branch of circumflex artery using an endeavor drug-eluting  stent.  3. Good left ventricular function.  4. Nonsustained ventricular tachycardia in the hospital.  5. Hypertension.  6. Hyperlipidemia.  7. Chronic obstructive pulmonary disease.  8. Dementia.   Past Surgical History:   Status post pacemaker insertion June 2012 Cataract extraction  Lumbar laminectomy  Transurethral resection of prostate  status post heart catheterization and stenting of the circumflex artery, October 2009  R CEA  carotid artery Doppler ultrasound August 17, 2007, August 22, 2008  colonoscopy Jul 17, 2007  Cardiolite stress test September 2010   Family History:  Reviewed history from 01/25/2008 and no changes required.  father died age 38, prostate cancer  mother died age 11  Two brothers and 5 sisters  two sisters deceased from complications of diabetes  one brother died unclear causes   Social History:  Reviewed history from 09/04/2008 and no changes required.  Retired  Married  Regular exercise-no  remote tobacco      Review of Systems  Constitutional: Negative for fever, chills, activity change, appetite change and fatigue.  HENT: Negative for hearing loss, ear pain, congestion, rhinorrhea, sneezing, mouth sores, trouble  swallowing, neck pain, neck stiffness, dental problem, voice change, sinus pressure and tinnitus.   Eyes: Negative for photophobia, pain, redness and visual disturbance.  Respiratory: Negative for apnea, cough, choking, chest tightness, shortness of breath and  wheezing.   Cardiovascular: Negative for chest pain, palpitations and leg swelling.  Gastrointestinal: Negative for nausea, vomiting, abdominal pain, diarrhea, constipation, blood in stool, abdominal distention, anal bleeding and rectal pain.  Genitourinary: Negative for dysuria, urgency, frequency, hematuria, flank pain, decreased urine volume, discharge, penile swelling, scrotal swelling, difficulty urinating, genital sores and testicular pain.  Musculoskeletal: Negative for myalgias, back pain, joint swelling, arthralgias and gait problem.  Skin: Negative for color change, rash and wound.  Neurological: Negative for dizziness, tremors, seizures, syncope, facial asymmetry, speech difficulty, weakness, light-headedness, numbness and headaches.  Hematological: Negative for adenopathy. Does not bruise/bleed easily.  Psychiatric/Behavioral: Positive for confusion. Negative for suicidal ideas, hallucinations, behavioral problems, sleep disturbance, self-injury, dysphoric mood, decreased concentration and agitation. The patient is not nervous/anxious.        Objective:   Physical Exam  Constitutional: He appears well-developed and well-nourished.  HENT:  Head: Normocephalic and atraumatic.  Right Ear: External ear normal.  Left Ear: External ear normal.  Nose: Nose normal.  Mouth/Throat: Oropharynx is clear and moist.  Eyes: Conjunctivae and EOM are normal. Pupils are equal, round, and reactive to light. No scleral icterus.  Neck: Normal range of motion. Neck supple. No JVD present. No thyromegaly present.       Decreased left carotid upstroke  Cardiovascular: Normal rate, regular rhythm, normal heart sounds and intact distal pulses.  Exam reveals no gallop and no friction rub.   No murmur heard. Pulmonary/Chest: Effort normal and breath sounds normal. He exhibits no tenderness.  Abdominal: Soft. Bowel sounds are normal. He exhibits no distension and no mass. There is no tenderness.    Genitourinary: Prostate normal and penis normal.  Musculoskeletal: Normal range of motion. He exhibits no edema and no tenderness.  Lymphadenopathy:    He has no cervical adenopathy.  Neurological: He is alert. He has normal reflexes. No cranial nerve deficit. Coordination normal.       Decreased vibratory sensation distally  Skin: Skin is warm and dry. No rash noted.  Psychiatric: He has a normal mood and affect. His behavior is normal.          Assessment & Plan:   Coronary artery disease status post permanent pacemaker for complete heart block Preventive health examination Dementia Cerebrovascular disease Hypertension well controlled  Continue his present regimen except Seroquel will be discontinued  Recheck 3 months or when necessary

## 2010-11-09 ENCOUNTER — Telehealth: Payer: Self-pay | Admitting: *Deleted

## 2010-11-09 NOTE — Telephone Encounter (Signed)
[name] [name] did not call in NTG when he left.  Does he stay on it?

## 2010-11-09 NOTE — Telephone Encounter (Signed)
Left message on pt's voice mail to stay on NTG prn.

## 2010-11-09 NOTE — Telephone Encounter (Signed)
Yes (prn)

## 2010-11-10 ENCOUNTER — Telehealth: Payer: Self-pay | Admitting: Internal Medicine

## 2010-11-10 NOTE — Telephone Encounter (Signed)
Left message on triage line. Pt's wife called to say that her husband has never had a Rx for nitro. She said that it is on the list from the place he was staying ([facility]hab?), and that if Dr. Kirtland Bouchard does want him to continue taking the nitro, he'll need to call in a RX for [facility]. Can call in/send in to [facility] on [address].

## 2010-11-11 NOTE — Telephone Encounter (Signed)
Please advise - nitro SL rx?

## 2010-11-11 NOTE — Telephone Encounter (Signed)
Okay to discontinue nitroglycerin in order

## 2010-11-12 NOTE — Telephone Encounter (Signed)
Attempt to call- VM at hm - left msg that nitro not needed , no rx

## 2010-11-17 ENCOUNTER — Telehealth: Payer: Self-pay | Admitting: Internal Medicine

## 2010-11-17 ENCOUNTER — Other Ambulatory Visit: Payer: Self-pay | Admitting: Internal Medicine

## 2010-11-17 MED ORDER — CLOPIDOGREL BISULFATE 75 MG PO TABS: 75.0000 mg | ORAL_TABLET | Freq: Every day | ORAL | Status: DC

## 2010-11-17 NOTE — Telephone Encounter (Signed)
Requesting refill on Plavix. Please call to the [facility] [place].

## 2010-11-17 NOTE — Telephone Encounter (Signed)
done

## 2010-11-17 NOTE — Telephone Encounter (Signed)
Requesting refill on Plavix. Please call to the [facility] Dr  Nicolette Bang.

## 2010-11-26 ENCOUNTER — Ambulatory Visit (INDEPENDENT_AMBULATORY_CARE_PROVIDER_SITE_OTHER): Payer: Medicare Other | Admitting: Internal Medicine

## 2010-11-26 DIAGNOSIS — Z0000 Encounter for general adult medical examination without abnormal findings: Secondary | ICD-10-CM

## 2010-11-26 DIAGNOSIS — Z23 Encounter for immunization: Secondary | ICD-10-CM

## 2010-12-14 LAB — DIFFERENTIAL
Basophils Absolute: 0
Basophils Relative: 0
Eosinophils Relative: 2
Monocytes Absolute: 0.2

## 2010-12-14 LAB — LIPID PANEL
Cholesterol: 164
Total CHOL/HDL Ratio: 4.3

## 2010-12-14 LAB — CBC
HCT: 33.7 — ABNORMAL LOW
HCT: 36.7 — ABNORMAL LOW
Hemoglobin: 10.9 — ABNORMAL LOW
Hemoglobin: 11.3 — ABNORMAL LOW
Hemoglobin: 12.4 — ABNORMAL LOW
MCHC: 33.5
MCHC: 33.7
MCV: 93.3
RBC: 3.5 — ABNORMAL LOW
RBC: 3.64 — ABNORMAL LOW
RDW: 13.8

## 2010-12-14 LAB — CARDIAC PANEL(CRET KIN+CKTOT+MB+TROPI)
CK, MB: 1.7
Relative Index: INVALID
Troponin I: 0.01

## 2010-12-14 LAB — BASIC METABOLIC PANEL
BUN: 10
Chloride: 110
GFR calc Af Amer: >60
GFR calc non Af Amer: >60
Glucose, Bld: 89
Potassium: 3.5
Potassium: 4.7
Sodium: 141
Sodium: 141

## 2010-12-14 LAB — POCT I-STAT, CHEM 8
Calcium, Ion: 1.2
HCT: 37 — ABNORMAL LOW
TCO2: 27

## 2010-12-14 LAB — HEMOGLOBIN A1C: Mean Plasma Glucose: 131

## 2010-12-14 LAB — PROTIME-INR: Prothrombin Time: 14.9

## 2010-12-14 LAB — POCT CARDIAC MARKERS: Myoglobin, poc: 67.7

## 2010-12-24 ENCOUNTER — Ambulatory Visit (INDEPENDENT_AMBULATORY_CARE_PROVIDER_SITE_OTHER): Payer: Medicare Other | Admitting: Internal Medicine

## 2010-12-24 ENCOUNTER — Encounter: Payer: Self-pay | Admitting: Internal Medicine

## 2010-12-24 DIAGNOSIS — R001 Bradycardia, unspecified: Secondary | ICD-10-CM

## 2010-12-24 DIAGNOSIS — I442 Atrioventricular block, complete: Secondary | ICD-10-CM

## 2010-12-24 DIAGNOSIS — I498 Other specified cardiac arrhythmias: Secondary | ICD-10-CM

## 2010-12-24 DIAGNOSIS — Z950 Presence of cardiac pacemaker: Secondary | ICD-10-CM | POA: Insufficient documentation

## 2010-12-24 LAB — PACEMAKER DEVICE OBSERVATION
ATRIAL PACING PM: 8.1
BAMS-0001: 150 {beats}/min
BAMS-0003: 70 {beats}/min
BATTERY VOLTAGE: 3.008 V
DEVICE MODEL PM: 7241194
RV LEAD THRESHOLD: 0.625 V
VENTRICULAR PACING PM: 1

## 2010-12-24 NOTE — Progress Notes (Signed)
  HPI  [name] [name] is a 77 y.o. male Is seen in followup for pacer implanted June 2012 for intermittent pauses with syncope. He has had no recurrent syncope.  He struggles with dementia and impulse control. His wife is hanging in there.   He has a history of CAD status post PCI to the circumflex in 2009. Nuclear stress test in October 2011.  This was normal       with an ejection fraction of 58%.  He has VT-NS    Past Medical History  Diagnosis Date  . ALLERGIC RHINITIS 08/09/2006  . ANEMIA, MILD 11/21/2008  . BENIGN PROSTATIC HYPERTROPHY 08/09/2006  . CEREBROVASCULAR ACCIDENT, HX OF 08/09/2006  . CEREBROVASCULAR DISEASE LATE EFFECTS, NOS 11/25/2006  . COLONIC POLYPS, HX OF 08/09/2006  . COPD 01/13/2009  . CORONARY ARTERY DISEASE 01/12/2008  . DYSPNEA 11/21/2008  . GERD 08/09/2006  . HYPERLIPIDEMIA 08/09/2006  . HYPERTENSION, BENIGN 08/13/2008  . MELENA, HX OF 02/01/2008  . Memory loss 05/22/2007  . PHLEBITIS, LOWER EXTREMITY 06/08/2007  . Polymyalgia rheumatica 08/09/2006  . SYNCOPE 12/25/2008  . TOBACCO USE, QUIT 12/23/2008  . VENTRICULAR TACHYCARDIA 01/12/2008  . WEAKNESS 11/21/2008    Past Surgical History  Procedure Date  . Cataract extraction   . Lumbar laminectomy   . Transurethral resection of prostate   . Coronary angioplasty with stent placement     Current Outpatient Prescriptions  Medication Sig Dispense Refill  . aspirin 81 MG tablet Take 81 mg by mouth daily.        . clonazePAM (KLONOPIN) 0.5 MG tablet Take 1 tablet (0.5 mg total) by mouth daily.  90 tablet  3  . clopidogrel (PLAVIX) 75 MG tablet Take 1 tablet (75 mg total) by mouth daily.  90 tablet  3  . divalproex (DEPAKOTE) 250 MG EC tablet Take 1 tablet (250 mg total) by mouth daily.  90 tablet  3  . labetalol (NORMODYNE) 100 MG tablet Take 1 tablet (100 mg total) by mouth 2 (two) times daily.  180 tablet  3  . nitroGLYCERIN (NITROSTAT) 0.4 MG SL tablet Place 0.4 mg under the tongue every 5 (five) minutes as  needed.        . pravastatin (PRAVACHOL) 20 MG tablet TAKE ONE TABLET BY MOUTH EVERY DAY  90 tablet  3  . ranitidine (ZANTAC) 150 MG tablet Take 150 mg by mouth at bedtime.        . rivastigmine (EXELON) 4.6 mg/24hr Place 1 patch (4.6 mg total) onto the skin daily.  30 patch  5  . ziprasidone (GEODON) injection Inject 20 mg into the muscle as needed.          Allergies  Allergen Reactions  . Atorvastatin     REACTION: Reaction not known  . Meperidine Hcl     REACTION: unspecified    Review of Systems negative except from HPI and PMH  Physical Exam Well developed and well nourished in no acute distress HENT normal edentulous E scleral and icterus clear Neck Supple Pacemaker pocket well healedl Clear to ausculation Regular rate and rhythm, no murmurs gallops or rub Soft with active bowel sounds No clubbing cyanosis and edema Alert but not oriented, grossly normal motor and sensory function Skin Warm and Dry   Assessment and  Plan

## 2010-12-24 NOTE — Patient Instructions (Signed)
Your physician wants you to follow-up in: June 2013 with Dr. Graciela Husbands. You will receive a reminder letter in the mail two months in advance. If you don't receive a letter, please call our office to schedule the follow-up appointment.  Your physician recommends that you continue on your current medications as directed. Please refer to the Current Medication list given to you today.

## 2010-12-24 NOTE — Assessment & Plan Note (Addendum)
The patient's device was interrogated and the information was fully reviewed.  The device was reprogrammed to maximize longevity He is atrially paced 9% and ventricularly paced less than 1%

## 2010-12-24 NOTE — Assessment & Plan Note (Signed)
No recurrent syncope 

## 2011-01-07 ENCOUNTER — Encounter: Payer: Self-pay | Admitting: Cardiovascular Disease

## 2011-01-07 ENCOUNTER — Ambulatory Visit (INDEPENDENT_AMBULATORY_CARE_PROVIDER_SITE_OTHER): Payer: Medicare Other | Admitting: Cardiovascular Disease

## 2011-01-07 VITALS — BP 115/80 | HR 72 | Ht 68.0 in | Wt 187.8 lb

## 2011-01-07 DIAGNOSIS — I2510 Atherosclerotic heart disease of native coronary artery without angina pectoris: Secondary | ICD-10-CM

## 2011-01-07 NOTE — Patient Instructions (Signed)
Your physician wants you to follow-up in: 12 months.  You will receive a reminder letter in the mail two months in advance. If you don't receive a letter, please call our office to schedule the follow-up appointment.  Your physician recommends that you continue on your current medications as directed. Please refer to the Current Medication list given to you today.  Your physician recommends that you return for fasting lab work next week--Lipid and Liver profile

## 2011-01-07 NOTE — Assessment & Plan Note (Signed)
Stable. No changes. He is not on a beta blocker secondary to intolerance in the past (syncope,weakness).

## 2011-01-07 NOTE — Progress Notes (Signed)
History of Present Illness:Mr. Caiazzo is 77 yo WM with history of CAD, complete heart block s/p PPM, CVA, HLD, COPD, dementia, HTN, former tobacco abuse here today for cardiac follow up. He has been followed in the past by Dr. Juanda Chance.  In 2009 he had a syncopal episode with documented ventricular tachycardia and underwent PCI of the circumflex artery with a drug-eluting stent. His pacemaker is followed by Dr. Graciela Husbands. Stress myoview October 2011 with no evidence of ischemia.   He denies any chest pain, SOB, palpitations. He works around [employer] and garden but has a hard time walking secondary to leg weakness. He is retired from [employer]. He had lab studies done in July with BMET, CBC and LFTs within normal limits. No recent lipid profile.   Past Medical History  Diagnosis Date  . Pacemaker 08/09/2006  . Complete heart block 11/21/2008    intermittent  . BENIGN PROSTATIC HYPERTROPHY 08/09/2006  . CEREBROVASCULAR ACCIDENT, HX OF 08/09/2006  . CEREBROVASCULAR DISEASE LATE EFFECTS, NOS 11/25/2006  . COLONIC POLYPS, HX OF 08/09/2006  . COPD 01/13/2009  . CORONARY ARTERY DISEASE 01/12/2008    Drug eluting stent Circumflex 2009  . DYSPNEA 11/21/2008  . GERD 08/09/2006  . HYPERLIPIDEMIA 08/09/2006  . HYPERTENSION, BENIGN 08/13/2008  . MELENA, HX OF 02/01/2008  . Memory loss 05/22/2007  . PHLEBITIS, LOWER EXTREMITY 06/08/2007  . Polymyalgia rheumatica 08/09/2006  . SYNCOPE 12/25/2008  . TOBACCO USE, QUIT 12/23/2008  . VENTRICULAR TACHYCARDIA 01/12/2008  . WEAKNESS 11/21/2008    Past Surgical History  Procedure Date  . Cataract extraction   . Lumbar laminectomy   . Transurethral resection of prostate   . Coronary angioplasty with stent placement     Current Outpatient Prescriptions  Medication Sig Dispense Refill  . aspirin 81 MG tablet Take 81 mg by mouth daily.        . clonazePAM (KLONOPIN) 0.5 MG tablet Take 1 tablet (0.5 mg total) by mouth daily.  90 tablet  3  . clopidogrel (PLAVIX) 75 MG tablet Take 1  tablet (75 mg total) by mouth daily.  90 tablet  3  . divalproex (DEPAKOTE) 250 MG EC tablet Take 1 tablet (250 mg total) by mouth daily.  90 tablet  3  . labetalol (NORMODYNE) 100 MG tablet Take 1 tablet (100 mg total) by mouth 2 (two) times daily.  180 tablet  3  . pravastatin (PRAVACHOL) 20 MG tablet TAKE ONE TABLET BY MOUTH EVERY DAY  90 tablet  3  . ranitidine (ZANTAC) 150 MG tablet Take 150 mg by mouth at bedtime.        . rivastigmine (EXELON) 4.6 mg/24hr Place 1 patch (4.6 mg total) onto the skin daily.  30 patch  5    Allergies  Allergen Reactions  . Atorvastatin     REACTION: Reaction not known  . Meperidine Hcl     REACTION: unspecified    History   Social History  . Marital Status: Married    Spouse Name: N/A    Number of Children: N/A  . Years of Education: N/A   Occupational History  . Not on file.   Social History Main Topics  . Smoking status: Current Some Day Smoker    Types: Cigars  . Smokeless tobacco: Never Used  . Alcohol Use: No  . Drug Use: No  . Sexually Active: Not on file   Other Topics Concern  . Not on file   Social History Narrative  . No narrative on  file    No family history on file.  Review of Systems:  As stated in the HPI and otherwise negative.   BP 115/80  Pulse 72  Ht 5\' 8"  (1.727 m)  Wt 187 lb 12.8 oz (85.186 kg)  BMI 28.56 kg/m2  Physical Examination: General: Well developed, well nourished, NAD HEENT: OP clear, mucus membranes moist SKIN: warm, dry. No rashes. Neuro: No focal deficits Musculoskeletal: Muscle strength 5/5 all ext Psychiatric: Mood and affect normal Neck: No JVD, no carotid bruits, no thyromegaly, no lymphadenopathy. Lungs:Clear bilaterally, no wheezes, rhonci, crackles Cardiovascular: Regular rate and rhythm. No murmurs, gallops or rubs. Abdomen:Soft. Bowel sounds present. Non-tender.  Extremities: No lower extremity edema. Pulses are trace + in the bilateral DP/PT.

## 2011-01-07 NOTE — Assessment & Plan Note (Signed)
Continue statin. Will check lipids and LFTs next week.

## 2011-01-11 ENCOUNTER — Other Ambulatory Visit (INDEPENDENT_AMBULATORY_CARE_PROVIDER_SITE_OTHER): Payer: Medicare Other | Admitting: *Deleted

## 2011-01-11 DIAGNOSIS — I2510 Atherosclerotic heart disease of native coronary artery without angina pectoris: Secondary | ICD-10-CM

## 2011-01-11 LAB — LIPID PANEL
Cholesterol: 165 mg/dL (ref 0–200)
HDL: 57.5 mg/dL (ref >39.00)
Total CHOL/HDL Ratio: 3
Triglycerides: 91 mg/dL (ref 0.0–149.0)

## 2011-01-11 LAB — HEPATIC FUNCTION PANEL
ALT: 14 U/L (ref 0–53)
AST: 20 U/L (ref 0–37)
Albumin: 3.8 g/dL (ref 3.5–5.2)
Alkaline Phosphatase: 41 U/L (ref 39–117)
Total Protein: 6.5 g/dL (ref 6.0–8.3)

## 2011-01-13 ENCOUNTER — Other Ambulatory Visit: Payer: Medicare Other | Admitting: *Deleted

## 2011-01-14 ENCOUNTER — Telehealth: Payer: Self-pay | Admitting: *Deleted

## 2011-01-14 DIAGNOSIS — E785 Hyperlipidemia, unspecified: Secondary | ICD-10-CM

## 2011-01-14 MED ORDER — PRAVASTATIN SODIUM 40 MG PO TABS: 40.0000 mg | ORAL_TABLET | Freq: Every evening | ORAL | Status: DC

## 2011-01-14 NOTE — Telephone Encounter (Signed)
Phone note started to place order and send prescription as noted on lab results from 01/11/11.

## 2011-02-01 ENCOUNTER — Emergency Department (HOSPITAL_COMMUNITY)
Admission: EM | Admit: 2011-02-01 | Discharge: 2011-02-01 | Disposition: A | Discharge status: Home / Self Care | Payer: Medicare Other | Attending: Emergency Medicine | Admitting: Emergency Medicine

## 2011-02-01 ENCOUNTER — Other Ambulatory Visit: Payer: Self-pay

## 2011-02-01 ENCOUNTER — Encounter (HOSPITAL_COMMUNITY): Payer: Self-pay | Admitting: Emergency Medicine

## 2011-02-01 ENCOUNTER — Emergency Department (HOSPITAL_COMMUNITY): Payer: Medicare Other

## 2011-02-01 ENCOUNTER — Telehealth: Payer: Self-pay | Admitting: *Deleted

## 2011-02-01 DIAGNOSIS — J449 Chronic obstructive pulmonary disease, unspecified: Secondary | ICD-10-CM | POA: Insufficient documentation

## 2011-02-01 DIAGNOSIS — J4489 Other specified chronic obstructive pulmonary disease: Secondary | ICD-10-CM | POA: Insufficient documentation

## 2011-02-01 DIAGNOSIS — I10 Essential (primary) hypertension: Secondary | ICD-10-CM | POA: Insufficient documentation

## 2011-02-01 DIAGNOSIS — R61 Generalized hyperhidrosis: Secondary | ICD-10-CM | POA: Insufficient documentation

## 2011-02-01 DIAGNOSIS — R5381 Other malaise: Secondary | ICD-10-CM | POA: Insufficient documentation

## 2011-02-01 DIAGNOSIS — Z79899 Other long term (current) drug therapy: Secondary | ICD-10-CM | POA: Insufficient documentation

## 2011-02-01 DIAGNOSIS — Z7982 Long term (current) use of aspirin: Secondary | ICD-10-CM | POA: Insufficient documentation

## 2011-02-01 DIAGNOSIS — F068 Other specified mental disorders due to known physiological condition: Secondary | ICD-10-CM | POA: Insufficient documentation

## 2011-02-01 DIAGNOSIS — Z8673 Personal history of transient ischemic attack (TIA), and cerebral infarction without residual deficits: Secondary | ICD-10-CM | POA: Insufficient documentation

## 2011-02-01 DIAGNOSIS — E785 Hyperlipidemia, unspecified: Secondary | ICD-10-CM | POA: Insufficient documentation

## 2011-02-01 DIAGNOSIS — I2510 Atherosclerotic heart disease of native coronary artery without angina pectoris: Secondary | ICD-10-CM | POA: Insufficient documentation

## 2011-02-01 DIAGNOSIS — K219 Gastro-esophageal reflux disease without esophagitis: Secondary | ICD-10-CM | POA: Insufficient documentation

## 2011-02-01 DIAGNOSIS — Z950 Presence of cardiac pacemaker: Secondary | ICD-10-CM | POA: Insufficient documentation

## 2011-02-01 LAB — CBC
HCT: 33.5 % — ABNORMAL LOW (ref 39.0–52.0)
MCHC: 33.1 g/dL (ref 30.0–36.0)
Platelets: 129 10*3/uL — ABNORMAL LOW (ref 150–400)
RDW: 13.6 % (ref 11.5–15.5)

## 2011-02-01 LAB — DIFFERENTIAL
Basophils Absolute: 0 10*3/uL (ref 0.0–0.1)
Basophils Relative: 0 % (ref 0–1)
Monocytes Absolute: 0.5 10*3/uL (ref 0.1–1.0)
Neutro Abs: 4.3 10*3/uL (ref 1.7–7.7)

## 2011-02-01 LAB — CARDIAC PANEL(CRET KIN+CKTOT+MB+TROPI)
Relative Index: INVALID (ref 0.0–2.5)
Total CK: 72 U/L (ref 7–232)
Troponin I: <0.3 ng/mL (ref <0.30)

## 2011-02-01 LAB — POCT I-STAT, CHEM 8
BUN: 9 mg/dL (ref 6–23)
Calcium, Ion: 1.18 mmol/L (ref 1.12–1.32)
Chloride: 105 mEq/L (ref 96–112)
Glucose, Bld: 89 mg/dL (ref 70–99)
HCT: 33 % — ABNORMAL LOW (ref 39.0–52.0)

## 2011-02-01 NOTE — ED Notes (Signed)
EKG performed at given to [place], [place].D

## 2011-02-01 NOTE — ED Provider Notes (Signed)
History     CSN: [id number] Arrival date & time: 02/01/2011 12:21 PM   First MD Initiated Contact with Patient 02/01/11 1311      Chief Complaint  Patient presents with  . Hot Flashes    30 minute hot flash     (Consider location/radiation/quality/duration/timing/severity/associated sxs/prior treatment) HPI Comments: Patient here with wife after she noted that he came in from the yard covered in sweat - she states that he complained of "feeling weak" and that he sat down, she immediately call to his PCP, Dr. Romilda Joy office who then instructed her to bring him here.  She reports that he did not complain of any further symptoms.  He has memory issues, but he denies headache, chest pain, shortness of breath, reports a history of MI, HTN, CVA.  She denies syncope with him.    Patient is a 77 y.o. male presenting with weakness. The history is provided by the patient. No language interpreter was used.  Weakness Primary symptoms do not include headaches, syncope, loss of consciousness, altered mental status, seizures, dizziness, paresthesias, focal weakness, loss of sensation, memory loss, fever, nausea or vomiting. The symptoms began 1 to 2 hours ago. The episode lasted 30 minutes. The symptoms are resolved. The neurological symptoms are diffuse.  Additional symptoms include weakness.    Past Medical History  Diagnosis Date  . Pacemaker 08/09/2006  . Complete heart block 11/21/2008    intermittent  . BENIGN PROSTATIC HYPERTROPHY 08/09/2006  . CEREBROVASCULAR ACCIDENT, HX OF 08/09/2006  . CEREBROVASCULAR DISEASE LATE EFFECTS, NOS 11/25/2006  . COLONIC POLYPS, HX OF 08/09/2006  . COPD 01/13/2009  . CORONARY ARTERY DISEASE 01/12/2008    Drug eluting stent Circumflex 2009  . DYSPNEA 11/21/2008  . GERD 08/09/2006  . HYPERLIPIDEMIA 08/09/2006  . HYPERTENSION, BENIGN 08/13/2008  . MELENA, HX OF 02/01/2008  . Memory loss 05/22/2007  . PHLEBITIS, LOWER EXTREMITY 06/08/2007  . Polymyalgia rheumatica  08/09/2006  . SYNCOPE 12/25/2008  . TOBACCO USE, QUIT 12/23/2008  . VENTRICULAR TACHYCARDIA 01/12/2008  . WEAKNESS 11/21/2008    Past Surgical History  Procedure Date  . Cataract extraction   . Lumbar laminectomy   . Transurethral resection of prostate   . Coronary angioplasty with stent placement     No family history on file.  History  Substance Use Topics  . Smoking status: Current Some Day Smoker    Types: Cigars  . Smokeless tobacco: Never Used  . Alcohol Use: No      Review of Systems  Unable to perform ROS: Dementia  Constitutional: Negative for fever.  Cardiovascular: Negative for syncope.  Gastrointestinal: Negative for nausea and vomiting.  Neurological: Positive for weakness. Negative for dizziness, focal weakness, seizures, loss of consciousness, headaches and paresthesias.  Psychiatric/Behavioral: Negative for memory loss and altered mental status.    Allergies  Atorvastatin and Meperidine hcl  Home Medications   Current Outpatient Rx  Name Route Sig Dispense Refill  . ASPIRIN 81 MG PO TABS Oral Take 81 mg by mouth daily.     Marland Kitchen CLONAZEPAM 0.5 MG PO TABS Oral Take 0.25 mg by mouth daily.      Marland Kitchen CLOPIDOGREL BISULFATE 75 MG PO TABS Oral Take 75 mg by mouth daily.      Marland Kitchen DIVALPROEX SODIUM 250 MG PO TBEC Oral Take 250 mg by mouth daily.      Marland Kitchen LABETALOL HCL 100 MG PO TABS Oral Take 100 mg by mouth 2 (two) times daily.      Marland Kitchen  PRAVASTATIN SODIUM 20 MG PO TABS Oral Take 20 mg by mouth daily.      Marland Kitchen RANITIDINE HCL 150 MG PO TABS Oral Take 150 mg by mouth at bedtime.      Marland Kitchen RIVASTIGMINE 4.6 MG/24HR TD PT24 Transdermal Place 1 patch onto the skin daily.        BP 139/83  Pulse 60  Temp(Src) 97.4 F (36.3 C) (Oral)  Resp 23  SpO2 97%  Physical Exam  Nursing note and vitals reviewed. Constitutional: He appears well-developed and well-nourished.  HENT:  Head: Normocephalic and atraumatic.  Right Ear: External ear normal.  Left Ear: External ear normal.    Mouth/Throat: Oropharynx is clear and moist.  Eyes: Conjunctivae are normal. Pupils are equal, round, and reactive to light. No scleral icterus.  Neck: Normal range of motion. Neck supple. No JVD present. No tracheal deviation present.  Cardiovascular: Normal rate, regular rhythm, normal heart sounds and intact distal pulses.  Exam reveals no gallop and no friction rub.   No murmur heard. Pulmonary/Chest: Effort normal and breath sounds normal. No respiratory distress. He exhibits no tenderness.  Abdominal: Soft. Bowel sounds are normal. He exhibits no distension. There is no tenderness.  Musculoskeletal: Normal range of motion.  Neurological: He is alert. No cranial nerve deficit.       Recalls where he is but does not know the date  Skin: Skin is warm and dry.  Psychiatric: He has a normal mood and affect. His behavior is normal. Judgment and thought content normal.    ED Course  Procedures (including critical care time)   Labs Reviewed  CBC  DIFFERENTIAL  I-STAT, CHEM 8  CARDIAC PANEL(CRET KIN+CKTOT+MB+TROPI)   No results found.   Date: 02/01/2011  Rate: 64  Rhythm: normal sinus rhythm  QRS Axis: normal  Intervals: normal  ST/T Wave abnormalities: normal  Conduction Disutrbances:none  Narrative Interpretation: improved since last - Reviewed by Dr. Ethelda Chick  Old EKG Reviewed: changes noted  Weakness   MDM  Though I doubt cardiac as the cause for his symptoms, differential could be related to hypoglycemia, TIA, CVA, chest pain, MI.  Will plan to get two sets of enzymes and then if all normal will discharge home.        Izola Price [place], [place] 02/01/11 1614

## 2011-02-01 NOTE — Telephone Encounter (Signed)
Wife calls stating pt comes in the house sweating through his entire layer of clothes and complains of being extremely hot.  Sent to the ER ASAP.

## 2011-02-01 NOTE — ED Notes (Signed)
Pt here with 30 minute "hot flash" around 0915 this am, states that he suddenly got very sweaty.  No chest pain, SOB, or n/v.  Pt's wife called dr and they told them to come in

## 2011-02-01 NOTE — ED Provider Notes (Signed)
Medical screening examination/treatment/procedure(s) were conducted as a shared visit with non-physician practitioner(s) and myself.  I personally evaluated the patient during the encounter  Doug Sou, MD 02/01/11 1640

## 2011-02-01 NOTE — ED Provider Notes (Signed)
Pt is waiting for the 3 hour cardiac marker. If normal, call [facility] and arrange the pt for an outpatient cardiology follow-up. Pt second cardiac marker is negative. Spoke with [facility]- Call office tomorrow, Thurston Hole, MD is aware that patient needs to be seen this week and appointment will be made for patient.   Dorthula Matas, PA 02/01/11 1746

## 2011-02-01 NOTE — ED Provider Notes (Signed)
Medical screening examination/treatment/procedure(s) were performed by non-physician practitioner and as supervising physician I was immediately available for consultation/collaboration.   Erionna Strum L Sohrab Keelan, MD 02/01/11 2211 

## 2011-02-01 NOTE — ED Provider Notes (Signed)
Patient store and [place]on, wife reports that pt c/o weakness and was diaphoertic when rertuerning from coming in from outside of his house. Denies chest pain no abdomenal pain  No other copmpalint. Pt asymtpomatic  vague  Doug Sou, MD 02/02/11 [phone]

## 2011-02-02 ENCOUNTER — Ambulatory Visit (INDEPENDENT_AMBULATORY_CARE_PROVIDER_SITE_OTHER): Payer: Medicare Other | Admitting: Internal Medicine

## 2011-02-02 ENCOUNTER — Encounter: Payer: Self-pay | Admitting: Internal Medicine

## 2011-02-02 DIAGNOSIS — R569 Unspecified convulsions: Secondary | ICD-10-CM

## 2011-02-02 DIAGNOSIS — I2510 Atherosclerotic heart disease of native coronary artery without angina pectoris: Secondary | ICD-10-CM

## 2011-02-02 DIAGNOSIS — Z950 Presence of cardiac pacemaker: Secondary | ICD-10-CM

## 2011-02-02 NOTE — Assessment & Plan Note (Signed)
As above.

## 2011-02-02 NOTE — Patient Instructions (Signed)
Your physician recommends that you continue on your current medications as directed. Please refer to the Current Medication list given to you today.  Your physician wants you to follow-up in: July 2013 with Dr. Graciela Husbands as previously told. You will receive a reminder letter in the mail two months in advance. If you don't receive a letter, please call our office to schedule the follow-up appointment.

## 2011-02-02 NOTE — Assessment & Plan Note (Signed)
I am not sure of the mechanism of yesterday spell. It might well have been ischemic. Troponins were negative an ECG was normal. He had negative Myoview in the spring. We discussed repeating it at this time and elected to pursue a more expectant approach and see what happens next. She'll try record his blood pressure and his heart rhythm if he has another spell.

## 2011-02-02 NOTE — Progress Notes (Signed)
HPI  [name] [name] is a 77 y.o. male Seen for an episode where he went to the emergency room yesterday because of weakness and diaphoresis. This persisted for about 30 minutes. His wife called the primary care office. They recommended a trip to the ER which he began completed. Laboratories included negative troponins x2 And a persistently low hemoglobin at 11 Electrocardiogram from the emergency room was reviewed and it too was normal   His history of intermittent complete heart block for which underwent pacing in the spring 2012. Interrogation of his device today demonstrated no arrhythmias yesterday  He also has a history of coronary artery disease with prior circumflex stenting.  He presented at that time not with chest pain but with syncope. He has never had a complaint of chest pain.  Past Medical History  Diagnosis Date  . Pacemaker 08/09/2006  . Complete heart block 11/21/2008    intermittent  . BENIGN PROSTATIC HYPERTROPHY 08/09/2006  . CEREBROVASCULAR ACCIDENT, HX OF 08/09/2006  . CEREBROVASCULAR DISEASE LATE EFFECTS, NOS 11/25/2006  . COLONIC POLYPS, HX OF 08/09/2006  . COPD 01/13/2009  . CORONARY ARTERY DISEASE 01/12/2008    Drug eluting stent Circumflex 2009  . DYSPNEA 11/21/2008  . GERD 08/09/2006  . HYPERLIPIDEMIA 08/09/2006  . HYPERTENSION, BENIGN 08/13/2008  . MELENA, HX OF 02/01/2008  . Memory loss 05/22/2007  . PHLEBITIS, LOWER EXTREMITY 06/08/2007  . Polymyalgia rheumatica 08/09/2006  . SYNCOPE 12/25/2008    Secondary heart block  . VENTRICULAR TACHYCARDIA 01/12/2008    Nonsustained  . WEAKNESS 11/21/2008    Past Surgical History  Procedure Date  . Cataract extraction   . Lumbar laminectomy   . Transurethral resection of prostate   . Coronary angioplasty with stent placement     Current Outpatient Prescriptions  Medication Sig Dispense Refill  . aspirin 81 MG tablet Take 81 mg by mouth daily.       . clonazePAM (KLONOPIN) 0.5 MG tablet Take 0.25 mg by mouth daily.         . clopidogrel (PLAVIX) 75 MG tablet Take 75 mg by mouth daily.        . divalproex (DEPAKOTE) 250 MG DR tablet Take 250 mg by mouth daily.        Marland Kitchen labetalol (NORMODYNE) 100 MG tablet Take 100 mg by mouth 2 (two) times daily.        . pravastatin (PRAVACHOL) 40 MG tablet Take 40 mg by mouth daily.        . ranitidine (ZANTAC) 150 MG tablet Take 150 mg by mouth at bedtime.        . rivastigmine (EXELON) 4.6 mg/24hr Place 1 patch onto the skin daily.        . pravastatin (PRAVACHOL) 20 MG tablet Take 20 mg by mouth daily.          Allergies  Allergen Reactions  . Atorvastatin Other (See Comments)    REACTION: Reaction not known  . Meperidine Hcl Other (See Comments)    REACTION: unspecified    Review of Systems negative except from HPI and PMH  Physical Exam Well developed and well nourished in no acute distress HENT normal With dentures E scleral and icterus clear Neck Supple JVP flat; carotids brisk and full Clear to ausculation Regular rate and rhythm, no murmurs gallops or rub Soft with active bowel sounds No clubbing cyanosis and edema Alert But not oriented, grossly normal motor and sensory function Skin Warm and Dry  Assessment and  Plan

## 2011-02-02 NOTE — Assessment & Plan Note (Signed)
The patient's device was interrogated.  The information was reviewed. No ventricular or supraventricular arrhythmias were noted coincidental yesterday's event

## 2011-02-02 NOTE — Telephone Encounter (Signed)
Dr. K aware. 

## 2011-02-08 ENCOUNTER — Other Ambulatory Visit: Payer: Self-pay

## 2011-02-08 ENCOUNTER — Encounter (HOSPITAL_COMMUNITY)
Admission: EM | Disposition: A | Discharge status: Home / Self Care | Payer: Self-pay | Source: Home / Self Care | Attending: Emergency Medicine

## 2011-02-08 ENCOUNTER — Encounter (HOSPITAL_COMMUNITY): Payer: Self-pay

## 2011-02-08 ENCOUNTER — Observation Stay (HOSPITAL_COMMUNITY)
Admission: EM | Admit: 2011-02-08 | Discharge: 2011-02-09 | Disposition: A | Discharge status: Home / Self Care | Payer: Medicare Other | Attending: Cardiology | Admitting: Cardiology

## 2011-02-08 ENCOUNTER — Emergency Department (HOSPITAL_COMMUNITY): Payer: Medicare Other

## 2011-02-08 ENCOUNTER — Ambulatory Visit: Payer: Medicare Other | Admitting: Internal Medicine

## 2011-02-08 DIAGNOSIS — I2510 Atherosclerotic heart disease of native coronary artery without angina pectoris: Secondary | ICD-10-CM | POA: Insufficient documentation

## 2011-02-08 DIAGNOSIS — I249 Acute ischemic heart disease, unspecified: Secondary | ICD-10-CM

## 2011-02-08 DIAGNOSIS — R5381 Other malaise: Secondary | ICD-10-CM

## 2011-02-08 DIAGNOSIS — R61 Generalized hyperhidrosis: Secondary | ICD-10-CM | POA: Insufficient documentation

## 2011-02-08 DIAGNOSIS — Z01812 Encounter for preprocedural laboratory examination: Secondary | ICD-10-CM | POA: Insufficient documentation

## 2011-02-08 DIAGNOSIS — R079 Chest pain, unspecified: Principal | ICD-10-CM | POA: Insufficient documentation

## 2011-02-08 DIAGNOSIS — R5383 Other fatigue: Secondary | ICD-10-CM

## 2011-02-08 DIAGNOSIS — J4489 Other specified chronic obstructive pulmonary disease: Secondary | ICD-10-CM | POA: Insufficient documentation

## 2011-02-08 DIAGNOSIS — Z01818 Encounter for other preprocedural examination: Secondary | ICD-10-CM | POA: Insufficient documentation

## 2011-02-08 DIAGNOSIS — J449 Chronic obstructive pulmonary disease, unspecified: Secondary | ICD-10-CM | POA: Insufficient documentation

## 2011-02-08 DIAGNOSIS — I10 Essential (primary) hypertension: Secondary | ICD-10-CM | POA: Insufficient documentation

## 2011-02-08 DIAGNOSIS — Z950 Presence of cardiac pacemaker: Secondary | ICD-10-CM | POA: Insufficient documentation

## 2011-02-08 DIAGNOSIS — T8859XA Other complications of anesthesia, initial encounter: Secondary | ICD-10-CM

## 2011-02-08 DIAGNOSIS — R0602 Shortness of breath: Secondary | ICD-10-CM | POA: Insufficient documentation

## 2011-02-08 DIAGNOSIS — J189 Pneumonia, unspecified organism: Secondary | ICD-10-CM

## 2011-02-08 HISTORY — DX: Malignant (primary) neoplasm, unspecified: C80.1

## 2011-02-08 HISTORY — DX: Pneumonia, unspecified organism: J18.9

## 2011-02-08 HISTORY — DX: Adverse effect of unspecified anesthetic, initial encounter: T41.45XA

## 2011-02-08 HISTORY — PX: LEFT HEART CATHETERIZATION WITH CORONARY ANGIOGRAM: SHX5451

## 2011-02-08 HISTORY — DX: Other complications of anesthesia, initial encounter: T88.59XA

## 2011-02-08 HISTORY — DX: Unspecified osteoarthritis, unspecified site: M19.90

## 2011-02-08 LAB — PRO B NATRIURETIC PEPTIDE: Pro B Natriuretic peptide (BNP): 209.9 pg/mL (ref 0–450)

## 2011-02-08 LAB — APTT: aPTT: 28 seconds (ref 24–37)

## 2011-02-08 LAB — BASIC METABOLIC PANEL
CO2: 29 mEq/L (ref 19–32)
Calcium: 9 mg/dL (ref 8.4–10.5)
Glucose, Bld: 137 mg/dL — ABNORMAL HIGH (ref 70–99)
Potassium: 4.5 mEq/L (ref 3.5–5.1)
Sodium: 139 mEq/L (ref 135–145)

## 2011-02-08 LAB — DIFFERENTIAL
Basophils Absolute: 0 10*3/uL (ref 0.0–0.1)
Lymphocytes Relative: 13 % (ref 12–46)
Lymphs Abs: 0.8 10*3/uL (ref 0.7–4.0)
Neutro Abs: 4.9 10*3/uL (ref 1.7–7.7)
Neutrophils Relative %: 77 % (ref 43–77)

## 2011-02-08 LAB — CARDIAC PANEL(CRET KIN+CKTOT+MB+TROPI)
CK, MB: 3.8 ng/mL (ref 0.3–4.0)
Relative Index: INVALID (ref 0.0–2.5)
Total CK: 72 U/L (ref 7–232)
Troponin I: <0.3 ng/mL (ref <0.30)
Troponin I: <0.3 ng/mL (ref <0.30)

## 2011-02-08 LAB — CBC
Platelets: 142 10*3/uL — ABNORMAL LOW (ref 150–400)
RBC: 3.86 MIL/uL — ABNORMAL LOW (ref 4.22–5.81)
RDW: 13.7 % (ref 11.5–15.5)
WBC: 6.4 10*3/uL (ref 4.0–10.5)

## 2011-02-08 LAB — HEMOGLOBIN A1C: Mean Plasma Glucose: 143 mg/dL — ABNORMAL HIGH (ref <117)

## 2011-02-08 LAB — GLUCOSE, CAPILLARY: Glucose-Capillary: 133 mg/dL — ABNORMAL HIGH (ref 70–99)

## 2011-02-08 SURGERY — LEFT HEART CATHETERIZATION WITH CORONARY ANGIOGRAM
Anesthesia: LOCAL

## 2011-02-08 MED ORDER — ONDANSETRON HCL 4 MG/2ML IJ SOLN: 4.0000 mg | Freq: Four times a day (QID) | INTRAMUSCULAR | Status: DC | PRN

## 2011-02-08 MED ORDER — ASPIRIN 81 MG PO CHEW
243.0000 mg | CHEWABLE_TABLET | Freq: Once | ORAL | Status: AC
  Administered 2011-02-08: 243 mg via ORAL
  Filled 2011-02-08: qty 3

## 2011-02-08 MED ORDER — ACETAMINOPHEN 325 MG PO TABS: 650.0000 mg | ORAL_TABLET | ORAL | Status: DC | PRN

## 2011-02-08 MED ORDER — LIDOCAINE HCL (PF) 1 % IJ SOLN
INTRAMUSCULAR | Status: AC
  Filled 2011-02-08: qty 30

## 2011-02-08 MED ORDER — LABETALOL HCL 200 MG PO TABS
200.0000 mg | ORAL_TABLET | Freq: Two times a day (BID) | ORAL | Status: DC
  Administered 2011-02-09: 200 mg via ORAL
  Filled 2011-02-08 (×3): qty 1

## 2011-02-08 MED ORDER — HEPARIN (PORCINE) IN NACL 2-0.9 UNIT/ML-% IJ SOLN
INTRAMUSCULAR | Status: AC
  Filled 2011-02-08: qty 2000

## 2011-02-08 MED ORDER — HYDRALAZINE HCL 20 MG/ML IJ SOLN
INTRAMUSCULAR | Status: AC
  Filled 2011-02-08: qty 1

## 2011-02-08 MED ORDER — ASPIRIN EC 81 MG PO TBEC: 81.0000 mg | DELAYED_RELEASE_TABLET | Freq: Every day | ORAL | Status: DC

## 2011-02-08 MED ORDER — CLOPIDOGREL BISULFATE 75 MG PO TABS
75.0000 mg | ORAL_TABLET | Freq: Every day | ORAL | Status: DC
  Administered 2011-02-08 – 2011-02-09 (×2): 75 mg via ORAL
  Filled 2011-02-08 (×2): qty 1

## 2011-02-08 MED ORDER — LIDOCAINE-EPINEPHRINE 1 %-1:100000 IJ SOLN
INTRAMUSCULAR | Status: AC
  Filled 2011-02-08: qty 1

## 2011-02-08 MED ORDER — RIVASTIGMINE 4.6 MG/24HR TD PT24
4.6000 mg | MEDICATED_PATCH | Freq: Every day | TRANSDERMAL | Status: DC
  Administered 2011-02-09: 4.6 mg via TRANSDERMAL
  Filled 2011-02-08: qty 1

## 2011-02-08 MED ORDER — LIDOCAINE-EPINEPHRINE 1 %-1:100000 IJ SOLN
2.0000 mL | Freq: Once | INTRAMUSCULAR | Status: AC
  Administered 2011-02-08: 2 mL via INTRADERMAL

## 2011-02-08 MED ORDER — NITROGLYCERIN 0.4 MG SL SUBL: 0.4000 mg | SUBLINGUAL_TABLET | SUBLINGUAL | Status: DC | PRN

## 2011-02-08 MED ORDER — FAMOTIDINE 10 MG PO TABS
10.0000 mg | ORAL_TABLET | Freq: Every day | ORAL | Status: DC
  Administered 2011-02-08 – 2011-02-09 (×2): 10 mg via ORAL
  Filled 2011-02-08 (×2): qty 1

## 2011-02-08 MED ORDER — LABETALOL HCL 5 MG/ML IV SOLN
INTRAVENOUS | Status: AC
  Filled 2011-02-08: qty 4

## 2011-02-08 MED ORDER — CLONAZEPAM 0.5 MG PO TABS
0.2500 mg | ORAL_TABLET | Freq: Every day | ORAL | Status: DC
  Administered 2011-02-09: 0.25 mg via ORAL
  Filled 2011-02-08: qty 1

## 2011-02-08 MED ORDER — ASPIRIN 81 MG PO CHEW: 324.0000 mg | CHEWABLE_TABLET | Freq: Once | ORAL | Status: DC

## 2011-02-08 MED ORDER — ASPIRIN EC 81 MG PO TBEC
81.0000 mg | DELAYED_RELEASE_TABLET | Freq: Every day | ORAL | Status: DC
  Administered 2011-02-09: 81 mg via ORAL
  Filled 2011-02-08: qty 1

## 2011-02-08 MED ORDER — DIVALPROEX SODIUM 250 MG PO DR TAB
250.0000 mg | DELAYED_RELEASE_TABLET | Freq: Every day | ORAL | Status: DC
  Administered 2011-02-09: 250 mg via ORAL
  Filled 2011-02-08: qty 1

## 2011-02-08 MED ORDER — LABETALOL HCL 100 MG PO TABS: 100.0000 mg | ORAL_TABLET | Freq: Two times a day (BID) | ORAL | Status: DC

## 2011-02-08 MED ORDER — HYDRALAZINE HCL 20 MG/ML IJ SOLN
10.0000 mg | Freq: Once | INTRAMUSCULAR | Status: AC
  Administered 2011-02-08: 10 mg via INTRAVENOUS

## 2011-02-08 MED ORDER — NITROGLYCERIN 0.2 MG/ML ON CALL CATH LAB
INTRAVENOUS | Status: AC
  Filled 2011-02-08: qty 1

## 2011-02-08 MED ORDER — SODIUM CHLORIDE 0.9 % IV SOLN: INTRAVENOUS | Status: AC

## 2011-02-08 MED ORDER — LABETALOL HCL 5 MG/ML IV SOLN
10.0000 mg | Freq: Once | INTRAVENOUS | Status: DC
  Filled 2011-02-08: qty 4

## 2011-02-08 NOTE — Op Note (Signed)
    Cardiac Cath Note  [name] [name] [id number] 07/02/1925  Procedure: Left Heart Cardiac Catheterization Note Indications:  Chest pain, near syncope, generalized weakness  Procedure Details Consent: Obtained Time Out: Verified patient identification, verified procedure, site/side was marked, verified correct patient position, special equipment/implants available, Radiology Safety Procedures followed,  medications/allergies/relevent history reviewed, required imaging and test results available.  Performed  The right femoral artery was easily canulated using a modified Seldinger technique.  Hemodynamics:    LV pressure: 174/22 Aortic pressure: 168/79  The iliac arteries and the distal aorta or very tortuous. We had some difficulty in getting up to the central aorta. We initially exchanged our first 2 sheaths over a guidewire but later placed a long sheath which helped positioning of the catheters. It was very difficult to engage the left main coronary artery without the use of the long sheath. Following placement of a long sheath, we used a Judkins left 5 catheter for left main engagement.  Angiography   Left Main: The left main coronary artery is very short and is normal.  Left anterior Descending: The left anterior descending artery is fairly smooth and normal in the proximal and mid segments. There is some tortuosity of the distal left anterior descending artery. The first diagonal artery is a moderate size vessel with minor luminal regularities. The second diagonal vessel has no significant regular she's. The terminal LAD has minor irregularities.  Left Circumflex:  The left  circumflex stent is widely patent. There is brisk flow through this region. The circumflex artery primarily provides flow to the first obtuse marginal distribution. Continuation circumflex artery is very small.   Right Coronary Artery:  right coronary artery is large and dominant. There are minor luminal  regularities. The posterior descending artery and the posterior lateral segment artery are normal.   LV Gram: Left ventricular was performed in the 30 RAO position. It reveals normal left ventricular systolic function.  The left ventricle is "spade shaped" which is consistent with hypertensive heart.  Complications: No apparent complications Patient did tolerate procedure well.  Conclusions:   1.  Minor coronary artery irregularities. The proximal circumflex stent is widely patent. 2. Normal left ventricular systolic function. 3. Moderate to severe hypertension. I suspect that the patient's symptoms were caused by marked blood pressure elevation. We'll need to treat his hypertensive much more aggressively. We'll start by increasing his labetalol. He has a pacemaker in place so we will not have to  worry about bradycardia.  Chrishelle Zito J. Quantez Schnyder, Jr., MD, FACC 02/08/2011, 3:48 PM     

## 2011-02-08 NOTE — Interval H&P Note (Signed)
History and Physical Interval Note:   02/08/2011   3:05 PM   [name] [name]  has presented today for surgery, with the diagnosis of Chest Pain  The various methods of treatment have been discussed with the patient and family. After consideration of risks, benefits and other options for treatment, the patient has consented to  Procedure(s): LEFT HEART CATHETERIZATION WITH CORONARY ANGIOGRAM as a surgical intervention .  The patients' history has been reviewed, patient examined, no change in status, stable for surgery.  I have reviewed the patients' chart and labs.  Questions were answered to the patient's satisfaction.     Elyn Aquas.  MD

## 2011-02-08 NOTE — ED Notes (Signed)
Per ems- pt c/o chest pain and diaphoresis around 9:45 today. Pain was relieved by the time EMS was on scene. 18 g in RAC. Pt took 81mg  of asa before ems arrived.

## 2011-02-08 NOTE — Brief Op Note (Signed)
    Cardiac Cath Note  [name] [name] [id number] 1925/06/19  Procedure: Left Heart Cardiac Catheterization Note Indications:  Chest pain, near syncope, generalized weakness  Procedure Details Consent: Obtained Time Out: Verified patient identification, verified procedure, site/side was marked, verified correct patient position, special equipment/implants available, Radiology Safety Procedures followed,  medications/allergies/relevent history reviewed, required imaging and test results available.  Performed  The right femoral artery was easily canulated using a modified Seldinger technique.  Hemodynamics:    LV pressure: 174/22 Aortic pressure: 168/79  The iliac arteries and the distal aorta or very tortuous. We had some difficulty in getting up to the central aorta. We initially exchanged our first 2 sheaths over a guidewire but later placed a long sheath which helped positioning of the catheters. It was very difficult to engage the left main coronary artery without the use of the long sheath. Following placement of a long sheath, we used a Judkins left 5 catheter for left main engagement.  Angiography   Left Main: The left main coronary artery is very short and is normal.  Left anterior Descending: The left anterior descending artery is fairly smooth and normal in the proximal and mid segments. There is some tortuosity of the distal left anterior descending artery. The first diagonal artery is a moderate size vessel with minor luminal regularities. The second diagonal vessel has no significant regular she's. The terminal LAD has minor irregularities.  Left Circumflex:  The left  circumflex stent is widely patent. There is brisk flow through this region. The circumflex artery primarily provides flow to the first obtuse marginal distribution. Continuation circumflex artery is very small.   Right Coronary Artery:  right coronary artery is large and dominant. There are minor luminal  regularities. The posterior descending artery and the posterior lateral segment artery are normal.   LV Gram: Left ventricular was performed in the 30 RAO position. It reveals normal left ventricular systolic function.  The left ventricle is "spade shaped" which is consistent with hypertensive heart.  Complications: No apparent complications Patient did tolerate procedure well.  Conclusions:   1.  Minor coronary artery irregularities. The proximal circumflex stent is widely patent. 2. Normal left ventricular systolic function. 3. Moderate to severe hypertension. I suspect that the patient's symptoms were caused by marked blood pressure elevation. We'll need to treat his hypertensive much more aggressively. We'll start by increasing his labetalol. He has a pacemaker in place so we will not have to  worry about bradycardia.  Vesta Mixer, [name]., MD, [facility] 02/08/2011, 3:48 PM

## 2011-02-08 NOTE — ED Provider Notes (Signed)
I saw and evaluated the patient, reviewed the resident's note and I agree with the findings and plan. I saw and agree with EKG reading.  Pt with sx concerning for ACS today while raking the yard.  NO infectious sx and states feels normal now but cardiac hx in the past where he never developed CP and just syncopized requiring stent.  Will have Palmer come and see.  Gwyneth Sprout, MD 02/08/11 1258

## 2011-02-08 NOTE — Plan of Care (Signed)
Problem: Phase I Progression Outcomes Goal: Vascular site scale level 0 - I Vascular Site Scale Level 0: No bruising/bleeding/hematoma Level I (Mild): Bruising/Ecchymosis, minimal bleeding/ooozing, palpable hematoma < 3 cm Level II (Moderate): Bleeding not affecting hemodynamic parameters, pseudoaneurysm, palpable hematoma > 3 cm Outcome: Completed/Met Date Met:  02/08/11 Level 0

## 2011-02-08 NOTE — ED Notes (Signed)
Cardiology MD at bedside.

## 2011-02-08 NOTE — ED Notes (Signed)
Brought Patient a pillow.

## 2011-02-08 NOTE — Progress Notes (Signed)
ANTICOAGULATION CONSULT NOTE - Follow Up Consult  Pharmacy Consult for Heparin Indication: chest pain/ACS  Allergies  Allergen Reactions  . Atorvastatin Other (See Comments)    Unknown.  . Meperidine Hcl Other (See Comments)    unknown   Assessment & Plan 77 yo M admitted with HTN urgency and CP, heparin ordered in ED prior to the cath lab and never started. Now status post cath with no further indication for heparin and order from provider for no pharmacological VTE prophylaxsis.  Pharmacy will sign off, please call for further questions.  Patient Measurements:   Vital Signs: Temp: 97.8 F (36.6 C) (11/26 1023) Temp src: Oral (11/26 1023) BP: 150/100 mmHg (11/26 1415) Pulse Rate: 67  (11/26 1458)  Labs:  Basename 02/08/11 1839 02/08/11 1057  HGB -- 11.8*  HCT -- 35.8*  PLT -- 142*  APTT -- 28  LABPROT -- 14.2  INR -- 1.08  HEPARINUNFRC -- --  CREATININE -- 0.94  CKTOTAL 59 72  CKMB 3.6 3.8  TROPONINI <0.30 <0.30   The CrCl is unknown because both a height and weight (above a minimum accepted value) are required for this calculation.   Medications:  Prescriptions prior to admission  Medication Sig Dispense Refill  . aspirin EC 81 MG tablet Take 81 mg by mouth daily.        . clonazePAM (KLONOPIN) 0.5 MG tablet Take 0.25 mg by mouth daily.        . clopidogrel (PLAVIX) 75 MG tablet Take 75 mg by mouth daily.        . divalproex (DEPAKOTE) 250 MG DR tablet Take 250 mg by mouth daily.        Marland Kitchen labetalol (NORMODYNE) 100 MG tablet Take 100 mg by mouth 2 (two) times daily.        . nitroGLYCERIN (NITROSTAT) 0.4 MG SL tablet Place 0.4 mg under the tongue every 5 (five) minutes as needed. For chest pain       . pravastatin (PRAVACHOL) 20 MG tablet Take 20 mg by mouth daily.        . ranitidine (ZANTAC) 150 MG tablet Take 150 mg by mouth at bedtime.        . rivastigmine (EXELON) 4.6 mg/24hr Place 1 patch onto the skin daily.         Scheduled:    . aspirin  243 mg  Oral Once  . aspirin EC  81 mg Oral Daily  . clonazePAM  0.25 mg Oral Daily  . clopidogrel  75 mg Oral Daily  . divalproex  250 mg Oral Daily  . famotidine  10 mg Oral Daily  . heparin      . hydrALAZINE  10-20 mg Intravenous Once  . labetalol  200 mg Oral BID  . labetalol      . labetalol  10 mg Intravenous Once  . lidocaine      . lidocaine-EPINEPHrine  2 mL Intradermal Once  . nitroGLYCERIN      . rivastigmine  4.6 mg Transdermal Daily  . DISCONTD: aspirin  324 mg Oral Once  . DISCONTD: aspirin EC  81 mg Oral Daily  . DISCONTD: labetalol  100 mg Oral BID    Kayte Borchard Christine Virginia Crews 02/08/2011,9:42 PM

## 2011-02-08 NOTE — H&P (Signed)
Patient ID: HARI CASAUS MRN: [id number], DOB/AGE: [birth year] 77 y.o. Date of Encounter: 02/08/2011  Primary Physician: Rogelia Boga, MD Primary Cardiologist: CM Electrophysiologist: SK  Chief Complaint: Weakness, possible anginal equivalent  HPI: Mr. Kornegay is an 77 year old male with a history of coronary artery disease and complete heart block. He was seen in the emergency room on 02/01/2011 for weakness and diaphoresis. The symptoms happened after some exertion. His cardiac enzymes were negative for MI. He was treated and released. He was seen by Dr. Graciela Husbands on 02/02/2011. His pacemaker was interrogated and showed no arrhythmias. He had not had any chest pain although he was not having chest pain prior to his stent in 2009.  On 02/07/2011 Mr. Mroczkowski had been out in the yard raking leaves. He is normally able to exert himself for about 30 minutes and then rest. Yesterday, he was able to exert himself for about 30 minutes at a time without any difficulty.   Today, he had been out raking leaves for about 45 minutes. His wife went out to check on him and he was coming in the house complaining of extreme weakness and diaphoresis. He also had a hand to his chest although he denied chest pain began and does not remember any today. She got him inside and sat him down. EMS was called when his symptoms did not resolve quickly. She checked his blood pressure and he was significantly hypertensive with a blood pressure of 224/127. She did not give him any medication. Upon EMS arrival his blood pressure was down to 140/97. He was transported to the hospital and in the emergency room he has some elevation in his blood pressure but his EKG is without acute ischemic changes. He is not pacing and has had no significant arrhythmias in the emergency room. His wife gives him his medications daily and is sure that he is compliant with all of them.  Past Medical History  Diagnosis Date  . Pacemaker - St Jude  08/2010  . Complete heart block     intermittent  . BENIGN PROSTATIC HYPERTROPHY 08/09/2006  . CEREBROVASCULAR ACCIDENT, HX OF 08/09/2006  . CEREBROVASCULAR DISEASE LATE EFFECTS, NOS 11/25/2006  . COLONIC POLYPS, HX OF 08/09/2006  . COPD 01/13/2009  . CORONARY ARTERY DISEASE 01/12/2008    Drug eluting stent Circumflex 2009, presenting symptom was syncope   . DYSPNEA 11/21/2008  . GERD 08/09/2006  . HYPERLIPIDEMIA 08/09/2006  . HYPERTENSION, BENIGN 08/13/2008  . MELENA, HX OF 02/01/2008  . Memory loss 05/22/2007  . PHLEBITIS, LOWER EXTREMITY 06/08/2007  . Polymyalgia rheumatica 08/09/2006  . SYNCOPE 2009    Secondary to heart block  . VENTRICULAR TACHYCARDIA 01/12/2008    Nonsustained  . WEAKNESS 11/21/2008     Surgical History:  Past Surgical History  Procedure Date  . Cataract extraction   . Lumbar laminectomy   . Transurethral resection of prostate   . Coronary angioplasty with stent placement    Right CEA    Left hand     Current outpatient prescriptions: aspirin EC 81 MG tablet, Take 81 mg by mouth daily.  , Disp: , Rfl: ;   clonazePAM (KLONOPIN) 0.5 MG tablet, Take 0.25 mg by mouth daily.  , Disp: , Rfl: ;   clopidogrel (PLAVIX) 75 MG tablet, Take 75 mg by mouth daily.  , Disp: , Rfl: ;   divalproex (DEPAKOTE) 250 MG DR tablet, Take 250 mg by mouth daily.  , Disp: , Rfl: ;   labetalol (NORMODYNE) 100  MG tablet, Take 100 mg by mouth 2 (two) times daily.  , Disp: , Rfl:  nitroGLYCERIN (NITROSTAT) 0.4 MG SL tablet, Place 0.4 mg under the tongue every 5 (five) minutes as needed. For chest pain , Disp: , Rfl: ;   pravastatin (PRAVACHOL) 20 MG tablet, Take 20 mg by mouth daily.  , Disp: , Rfl: ;   ranitidine (ZANTAC) 150 MG tablet, Take 150 mg by mouth at bedtime.  , Disp: , Rfl: ;  rivastigmine (EXELON) 4.6 mg/24hr, Place 1 patch onto the skin daily.  , Disp: , Rfl:   Allergies:  Allergies  Allergen Reactions  . Atorvastatin Other (See Comments)    Unknown.  . Meperidine Hcl Other  (See Comments)    unknown    History   Social History  . Marital Status: Married    Spouse Name: N/A    Number of Children: N/A  . Years of Education: N/A   Occupational History  . Retired Production designer, theatre/television/film for [employer]   Social History Main Topics  . Smoking status: Current Everyday Smoker    Types: Cigarettes, Cigars  . Smokeless tobacco: Never Used  . Alcohol Use: No  . Drug Use: No  . Sexually Active: Not on file   Family history: Both of his parents were in their late 27s or early 23s when they died. His father had a history of coronary artery disease but his mother did not. He has one brother who died in his 65s and had cardiac issues but no further details are available.  Review of Systems: General: negative for chills, fever, night sweats or weight changes.  Cardiovascular: negative for chest pain, he has chronic dyspnea on exertion, but no edema, orthopnea, palpitations, paroxysmal nocturnal dyspnea or shortness of breath at rest Dermatological: negative for rash Respiratory: negative for cough although the wife states that he has dyspnea on exertion and possibly wheezing when he exerts himself.  Urologic: negative for hematuria Abdominal: negative for nausea, vomiting, diarrhea, bright red blood per rectum, melena, or hematemesis Neurologic: negative for visual changes, syncope, or dizziness his wife states that he his legs get weak when he walks, he walks very slowly but does not get short of breath unless he tries to increase his exertion level. Full 14 point review of systems otherwise negative except as noted above.  Labs:   Lab Results  Component Value Date   WBC 6.4 02/08/2011   HGB 11.8* 02/08/2011   HCT 35.8* 02/08/2011   MCV 92.7 02/08/2011   PLT 142* 02/08/2011    Lab 02/08/11 1057  NA 139  K 4.5  CL 105  CO2 29  BUN 10  CREATININE 0.94  CALCIUM 9.0  PROT --  BILITOT --  ALKPHOS --  ALT --  AST --  GLUCOSE 137*    Basename 02/08/11 1057    CKTOTAL 72  CKMB 3.8  TROPONINI <0.30   Lab Results  Component Value Date   CHOL 165 01/11/2011   HDL 57.50 01/11/2011   LDLCALC 89 01/11/2011   TRIG 91.0 01/11/2011   No results found for this basename: DDIMER    Radiology/Studies:  Dg Chest Port 1 View 02/08/2011  *RADIOLOGY REPORT*  Clinical Data: Chest pain.  Weakness.  PORTABLE CHEST - 1 VIEW  Comparison: 02/01/2011.  Findings: The cardiac silhouette remains enlarged with a mild increase in size.  Mild increase in prominence of the pulmonary vasculature.  Stable linear scarring at the lung bases.  Stable left subclavian pacemaker leads.  Stable thoracic scoliosis and degenerative changes.  IMPRESSION: Mildly progressive cardiomegaly and pulmonary vascular congestion.  Original Report Authenticated By: Darrol Angel, M.D.     EKG: SR, PAC seen, no pacing on ECG  Physical Exam: Blood pressure 163/85, pulse 62, temperature 97.8 F (36.6 C), temperature source Oral, resp. rate 15, SpO2 100.00%. General: Well developed, well nourished, elderly white male in no acute distress. Head: Normocephalic, atraumatic, sclera non-icteric, no xanthomas, nares are without discharge. edentulous with dentures in place.  Neck: Negative for carotid bruits. JVD not elevated. Right CEA scar well-healed Lungs: has decreased breath sounds bases, left >right, Breathing is unlabored. Heart: RRR with S1 S2. No murmurs, rubs, or gallops appreciated. Abdomen: Soft, non-tender, non-distended with normoactive bowel sounds. No hepatomegaly. No rebound/guarding. No obvious abdominal masses. Msk:  Strength and tone appear normal for age. Extremities: No clubbing or cyanosis. No edema.  Distal pedal pulses are 2+ and equal bilaterally. Neuro: Alert and oriented X 3. Moves all extremities spontaneously. Psych:  Responds to questions appropriately, memory is poor, but with a normal affect.  ASSESSMENT AND PLAN: Mr. Whitby was seen by Dr. Daleen Squibb, the patient  evaluated and the data reviewed. He is an 77 year old male with a history of coronary artery disease and complete heart block. He has had 2 episodes of exertional weakness and diaphoresis. He was possibly short of breath although this is not clear. He was significantly hypertensive during the second episode.   He will be admitted. There is concern for an anginal cause of his symptoms since his pacemaker showed no arrhythmia after the last episode.  Cardiac catheterization can be performed to further define his anatomy. Further evaluation and treatment will depend on the results of testing.  Signed, Bjorn Loser Barrett PA-C 02/08/2011, 12:49 PM   I have taken a history, reviewed medications, allergies, PMH, SH, FH, and reviewed ROS and examined the patient.  I agree with the assessment and plan. I am concerned about  Coronary ischemia. I have recommended cardiac cath to he and his wife. Indication, risk, potential benefit discussed. They agree to proceed this afternoon.    Margarete Horace C. Daleen Squibb, MD, [facility] Pager:  [phone]

## 2011-02-08 NOTE — ED Provider Notes (Signed)
History     CSN: [id number] Arrival date & time: 02/08/2011 10:09 AM   First MD Initiated Contact with Patient 02/08/11 1025      Chief Complaint  Patient presents with  . Chest Pain  . Shortness of Breath    (Consider location/radiation/quality/duration/timing/severity/associated sxs/prior treatment) HPI This is a 77 year old male with PMH of HTN,CAD, V tachy, PPM for CHB and COPD who presents to the ED with one episode of weakness and diaphoresis. The history is mainly provided by his wife because patient is poor historian due to his memory difficulties.  Pt's wife states that pt was noticed to hold his left chest and sweating at 945 am when he walked in his house after raking leaves in the yard. Pt told his wife and he felt weak, stating," I feel weak, and I do not think that I will make it."   Pt had all of his morning medications including baby Aspirin.  Pt wife took his Blood pressure 224/127 and called EMS. And Pt had SBP 190's with EMS.   Of note, pt had very similar episode with negative cardiac workup at [facility] ED on 02/01/2011.  Pt was evaluated by his cardiologist Dr. Graciela Husbands on 02/02/2011 and his pacemaker was interrogated with no abnormal arrhythmia noted. Pt and his wife was instructed to record his BP and heart rhythm should he has another spell.  No headache, fever, or sore throat. No shortness of breath or dyspnea on exertion. No chest pain, chest pressure or palpitation.  No nausea, vomiting, or abdominal pain. No melena, diarrhea or incontinence. No muscle weakness.     Denies depression. No appetite or weight changes.   Past Medical History  Diagnosis Date  . Pacemaker 08/09/2006  . Complete heart block 11/21/2008    intermittent  . BENIGN PROSTATIC HYPERTROPHY 08/09/2006  . CEREBROVASCULAR ACCIDENT, HX OF 08/09/2006  . CEREBROVASCULAR DISEASE LATE EFFECTS, NOS 11/25/2006  . COLONIC POLYPS, HX OF 08/09/2006  . COPD 01/13/2009  . CORONARY ARTERY DISEASE 01/12/2008    Drug  eluting stent Circumflex 2009  . DYSPNEA 11/21/2008  . GERD 08/09/2006  . HYPERLIPIDEMIA 08/09/2006  . HYPERTENSION, BENIGN 08/13/2008  . MELENA, HX OF 02/01/2008  . Memory loss 05/22/2007  . PHLEBITIS, LOWER EXTREMITY 06/08/2007  . Polymyalgia rheumatica 08/09/2006  . SYNCOPE 12/25/2008    Secondary heart block  . VENTRICULAR TACHYCARDIA 01/12/2008    Nonsustained  . WEAKNESS 11/21/2008    Past Surgical History  Procedure Date  . Cataract extraction   . Lumbar laminectomy   . Transurethral resection of prostate   . Coronary angioplasty with stent placement     No family history on file.  History  Substance Use Topics  . Smoking status: Current Everyday Smoker    Types: Cigarettes, Cigars  . Smokeless tobacco: Never Used  . Alcohol Use: No      Review of Systems See HPI  Allergies  Atorvastatin and Meperidine hcl  Home Medications   Current Outpatient Rx  Name Route Sig Dispense Refill  . ASPIRIN EC 81 MG PO TBEC Oral Take 81 mg by mouth daily.      Marland Kitchen CLONAZEPAM 0.5 MG PO TABS Oral Take 0.25 mg by mouth daily.      Marland Kitchen CLOPIDOGREL BISULFATE 75 MG PO TABS Oral Take 75 mg by mouth daily.      Marland Kitchen DIVALPROEX SODIUM 250 MG PO TBEC Oral Take 250 mg by mouth daily.      Marland Kitchen LABETALOL HCL  100 MG PO TABS Oral Take 100 mg by mouth 2 (two) times daily.      Marland Kitchen NITROGLYCERIN 0.4 MG SL SUBL Sublingual Place 0.4 mg under the tongue every 5 (five) minutes as needed. For chest pain     . PRAVASTATIN SODIUM 20 MG PO TABS Oral Take 20 mg by mouth daily.      Marland Kitchen RANITIDINE HCL 150 MG PO TABS Oral Take 150 mg by mouth at bedtime.      Marland Kitchen RIVASTIGMINE 4.6 MG/24HR TD PT24 Transdermal Place 1 patch onto the skin daily.        BP 147/87  Pulse 59  Temp(Src) 97.8 F (36.6 C) (Oral)  Resp 22  SpO2 98%  Physical Exam General: NAD, alert, well-developed, and cooperative to examination.  Head: normocephalic and atraumatic.  Eyes: vision grossly intact, pupils equal, pupils round, pupils reactive to  light, no injection and anicteric.  Mouth: pharynx pink and moist, no erythema, and no exudates.  Neck: supple, full ROM, no thyromegaly, no JVD, and no carotid bruits.  Lungs: normal respiratory effort, no accessory muscle use, bibasilar lung sounds diminished with a few exp. Wheezes noted on LLL. No rales. Heart: normal rate, regular rhythm, no murmur, no gallop, and no rub.  Abdomen: soft, non-tender, normal bowel sounds, no distention, no guarding, no rebound tenderness, no hepatomegaly, and no splenomegaly.  Msk: no joint swelling, no joint warmth, and no redness over joints.  Pulses: 2+ DP/PT pulses bilaterally Extremities: No cyanosis, clubbing, edema Neurologic: alert & oriented X self, cranial nerves II-XII intact, strength normal in all extremities, sensation intact to light touch. Skin: turgor normal and no rashes.    ED Course  Procedures (including critical care time)  Date: 02/08/2011  Rate: 61  Rhythm: normal sinus rhythm  QRS Axis: normal  Intervals: normal  ST/T Wave abnormalities: normal  Conduction Disutrbances:none  Narrative Interpretation:   Old EKG Reviewed: unchanged  Results for orders placed during the hospital encounter of 02/08/11  CBC      Component Value Range   WBC 6.4  4.0 - 10.5 (K/uL)   RBC 3.86 (*) 4.22 - 5.81 (MIL/uL)   Hemoglobin 11.8 (*) 13.0 - 17.0 (g/dL)   HCT 16.1 (*) 09.6 - 52.0 (%)   MCV 92.7  78.0 - 100.0 (fL)   MCH 30.6  26.0 - 34.0 (pg)   MCHC 33.0  30.0 - 36.0 (g/dL)   RDW 04.5  40.9 - 81.1 (%)   Platelets 142 (*) 150 - 400 (K/uL)  DIFFERENTIAL      Component Value Range   Neutrophils Relative 77  43 - 77 (%)   Neutro Abs 4.9  1.7 - 7.7 (K/uL)   Lymphocytes Relative 13  12 - 46 (%)   Lymphs Abs 0.8  0.7 - 4.0 (K/uL)   Monocytes Relative 7  3 - 12 (%)   Monocytes Absolute 0.4  0.1 - 1.0 (K/uL)   Eosinophils Relative 2  0 - 5 (%)   Eosinophils Absolute 0.1  0.0 - 0.7 (K/uL)   Basophils Relative 1  0 - 1 (%)   Basophils  Absolute 0.0  0.0 - 0.1 (K/uL)  BASIC METABOLIC PANEL      Component Value Range   Sodium 139  135 - 145 (mEq/L)   Potassium 4.5  3.5 - 5.1 (mEq/L)   Chloride 105  96 - 112 (mEq/L)   CO2 29  19 - 32 (mEq/L)   Glucose, Bld 137 (*) 70 -  99 (mg/dL)   BUN 10  6 - 23 (mg/dL)   Creatinine, Ser 1.61  0.50 - 1.35 (mg/dL)   Calcium 9.0  8.4 - 09.6 (mg/dL)   GFR calc non Af Amer 75 (*) >90 (mL/min)   GFR calc Af Amer 87 (*) >90 (mL/min)  CARDIAC PANEL(CRET KIN+CKTOT+MB+TROPI)      Component Value Range   Total CK 72  7 - 232 (U/L)   CK, MB 3.8  0.3 - 4.0 (ng/mL)   Troponin I <0.30  <0.30 (ng/mL)   Relative Index RELATIVE INDEX IS INVALID  0.0 - 2.5   PRO B NATRIURETIC PEPTIDE      Component Value Range   BNP, POC 209.9  0 - 450 (pg/mL)  APTT      Component Value Range   aPTT 28  24 - 37 (seconds)  PROTIME-INR      Component Value Range   Prothrombin Time 14.2  11.6 - 15.2 (seconds)   INR 1.08  0.00 - 1.49   GLUCOSE, CAPILLARY      Component Value Range   Glucose-Capillary 133 (*) 70 - 99 (mg/dL)   Dg Chest 2 View  04/54/0981  *RADIOLOGY REPORT*  Clinical Data: Chest pain.  Near syncope.  CHEST - 2 VIEW  Comparison: 09/09/2010 and  11/21/2008  Findings: Heart size and vascularity are normal.  Dual lead pacer in place.  Slight linear scarring at both lung bases.  The lungs are hyperinflated consistent with emphysema.  No acute infiltrates or effusions.  No acute osseous abnormality.  IMPRESSION: No acute abnormalities.  Probable emphysema.  Original Report Authenticated By: Gwynn Burly, M.D.   Dg Chest Port 1 View  02/08/2011  *RADIOLOGY REPORT*  Clinical Data: Chest pain.  Weakness.  PORTABLE CHEST - 1 VIEW  Comparison: 02/01/2011.  Findings: The cardiac silhouette remains enlarged with a mild increase in size.  Mild increase in prominence of the pulmonary vasculature.  Stable linear scarring at the lung bases.  Stable left subclavian pacemaker leads.  Stable thoracic scoliosis and  degenerative changes.  IMPRESSION: Mildly progressive cardiomegaly and pulmonary vascular congestion.  Original Report Authenticated By: Darrol Angel, M.D.    12:27 PM [facility] called and will come to evaluate pt.   MDM  1. Weakness and diaphoresis Given pt extensive cardiovascular history, cardiac source needs to be evaluated.  - will order general labs and cardiac markers - will consult cardiology.         Dede Query, MD 02/08/11 1137  Dede Query, MD 02/08/11 1250

## 2011-02-09 DIAGNOSIS — I10 Essential (primary) hypertension: Secondary | ICD-10-CM

## 2011-02-09 LAB — COMPREHENSIVE METABOLIC PANEL
ALT: 12 U/L (ref 0–53)
AST: 20 U/L (ref 0–37)
Albumin: 4 g/dL (ref 3.5–5.2)
CO2: 24 mEq/L (ref 19–32)
Calcium: 9.4 mg/dL (ref 8.4–10.5)
Chloride: 106 mEq/L (ref 96–112)
Creatinine, Ser: 0.92 mg/dL (ref 0.50–1.35)
Sodium: 143 mEq/L (ref 135–145)

## 2011-02-09 LAB — LIPID PANEL
Cholesterol: 189 mg/dL (ref 0–200)
HDL: 65 mg/dL (ref >39)
Total CHOL/HDL Ratio: 2.9 RATIO
VLDL: 35 mg/dL (ref 0–40)

## 2011-02-09 LAB — CARDIAC PANEL(CRET KIN+CKTOT+MB+TROPI)
CK, MB: 3.9 ng/mL (ref 0.3–4.0)
Relative Index: INVALID (ref 0.0–2.5)
Troponin I: <0.3 ng/mL (ref <0.30)

## 2011-02-09 MED ORDER — LABETALOL HCL 200 MG PO TABS
400.0000 mg | ORAL_TABLET | Freq: Two times a day (BID) | ORAL | Status: DC
  Filled 2011-02-09 (×2): qty 2

## 2011-02-09 MED ORDER — LABETALOL HCL 200 MG PO TABS: 400.0000 mg | ORAL_TABLET | Freq: Two times a day (BID) | ORAL | Status: DC

## 2011-02-09 MED ORDER — LABETALOL HCL 100 MG PO TABS: 200.0000 mg | ORAL_TABLET | Freq: Two times a day (BID) | ORAL | Status: DC

## 2011-02-09 MED ORDER — LABETALOL HCL 200 MG PO TABS: 200.0000 mg | ORAL_TABLET | Freq: Two times a day (BID) | ORAL | Status: DC

## 2011-02-09 NOTE — Progress Notes (Signed)
Patient ordered both observation and IP. Contacted Theodore Demark PA and ordered clarification received.

## 2011-02-09 NOTE — Progress Notes (Signed)
Reviewed discharge medication and instructions.  Follow up appointments noted.  Pt and wife verbalizes understanding of signs and symptoms of infection.  Pt and wife aware of when to call the MD.  Pt awaiting wheelchair for discharge.  Will continue to monitor until discharge. Thomas Hoff

## 2011-02-09 NOTE — Progress Notes (Signed)
Utilization review complete 

## 2011-02-09 NOTE — Consult Note (Signed)
Pt's RN informed me of his dementia. Pt tells me he smokes 3 little cigars per day and doesn't believe it's hurting him and that he doesn't inhale it. Explained to pt about the inhaling and that he is inhaling even with 2nd hand smoke. Pt in pre-contemplation stage. I don't believe he will quit. Discussed the risks associated with his smoking. Pt agreed to receive education handout and contact info.

## 2011-02-09 NOTE — Progress Notes (Signed)
Patient ID: [name] [name], male   DOB: 27-Jan-1926, 77 y.o.   MRN: [id number] SUBJECTIVE: No complaints this am, in chair, has not ambulated.  Filed Vitals:   02/08/11 2200 02/09/11 0645 02/09/11 0728 02/09/11 0806  BP: 155/90 163/115 163/88   Pulse: 72 92 90   Temp:  98.3 F (36.8 C)    TempSrc:  Oral    Resp: 24 19    Height:    6' (1.829 m)  SpO2: 93% 97%      Intake/Output Summary (Last 24 hours) at 02/09/11 0826 Last data filed at 02/08/11 2118  Gross per 24 hour  Intake      0 ml  Output    675 ml  Net   -675 ml    LABS: Basic Metabolic Panel:  Basename 02/09/11 0500 02/08/11 1057  NA 143 139  K 3.8 4.5  CL 106 105  CO2 24 29  GLUCOSE 104* 137*  BUN 10 10  CREATININE 0.92 0.94  CALCIUM 9.4 9.0  MG -- --  PHOS -- --   Liver Function Tests:  Basename 02/09/11 0500  AST 20  ALT 12  ALKPHOS 55  BILITOT 0.8  PROT 7.2  ALBUMIN 4.0   No results found for this basename: LIPASE:2,AMYLASE:2 in the last 72 hours CBC:  Basename 02/08/11 1057  WBC 6.4  NEUTROABS 4.9  HGB 11.8*  HCT 35.8*  MCV 92.7  PLT 142*   Cardiac Enzymes:  Basename 02/09/11 0600 02/09/11 0017 02/08/11 1839  CKTOTAL 119 77 59  CKMB 4.7* 3.9 3.6  CKMBINDEX -- -- --  TROPONINI <0.30 <0.30 <0.30   BNP:  Basename 02/08/11 1057  POCBNP 209.9   D-Dimer: No results found for this basename: DDIMER:2 in the last 72 hours Hemoglobin A1C:  Basename 02/08/11 1844  HGBA1C 6.6*   Fasting Lipid Panel:  Basename 02/09/11 0500  CHOL 189  HDL 65  LDLCALC 89  TRIG 176*  CHOLHDL 2.9  LDLDIRECT --   Thyroid Function Tests:  Basename 02/08/11 1844  TSH 1.442  T4TOTAL --  T3FREE --  THYROIDAB --   Anemia Panel: No results found for this basename: VITAMINB12,FOLATE,FERRITIN,TIBC,IRON,RETICCTPCT in the last 72 hours  RADIOLOGY: Dg Chest 2 View  02/01/2011  *RADIOLOGY REPORT*  Clinical Data: Chest pain.  Near syncope.  CHEST - 2 VIEW  Comparison: 09/09/2010 and  11/21/2008   Findings: Heart size and vascularity are normal.  Dual lead pacer in place.  Slight linear scarring at both lung bases.  The lungs are hyperinflated consistent with emphysema.  No acute infiltrates or effusions.  No acute osseous abnormality.  IMPRESSION: No acute abnormalities.  Probable emphysema.  Original Report Authenticated By: Gwynn Burly, M.D.   Dg Chest Port 1 View  02/08/2011  *RADIOLOGY REPORT*  Clinical Data: Chest pain.  Weakness.  PORTABLE CHEST - 1 VIEW  Comparison: 02/01/2011.  Findings: The cardiac silhouette remains enlarged with a mild increase in size.  Mild increase in prominence of the pulmonary vasculature.  Stable linear scarring at the lung bases.  Stable left subclavian pacemaker leads.  Stable thoracic scoliosis and degenerative changes.  IMPRESSION: Mildly progressive cardiomegaly and pulmonary vascular congestion.  Original Report Authenticated By: Darrol Angel, M.D.    PHYSICAL EXAM General: Well developed, well nourished, in no acute distress Head: Eyes PERRLA, No xanthomas.   Normal cephalic and atramatic  Lungs: Clear bilaterally to auscultation and percussion. Heart: HRRR S1 S2, with soft S4 murmur.  Pulses are 2+ &  equal.            No carotid bruit. No JVD.  No abdominal bruits. No femoral bruits. Abdomen: Bowel sounds are positive, abdomen soft and non-tender without masses or                  Hernia's noted. Msk:  Back normal, normal gait. Normal strength and tone for age. Extremities: No clubbing, cyanosis or edema.  DP +1, right groin stable,Neuro: Alert and oriented X 3. Psych:  Good affect, responds appropriately  TELEMETRY: Reviewed telemetry pt in NSR  ASSESSMENT AND PLAN:  Spells most likely related to poorly controlled BP especially with activity. Labetolol increased but still at low dose and not on any other BP meds. Will increase to 400 mg bid. Ambulate and discharge this afternoon.   Valera Castle, MD 02/09/2011 8:26 AM

## 2011-02-09 NOTE — Progress Notes (Signed)
CARDIAC REHAB PHASE I   PRE:  Rate/Rhythm: 68SR  BP:  Supine:   Sitting: 100/65  Standing:    SaO2: 96%RA  MODE:  Ambulation: 550 ft   POST:  Rate/Rhythem: 71  BP:  Supine:   Sitting: 132/84  Standing:    SaO2: 97%RA  Pt walked 550 ft with rolling walker and asst x 1. Gait fairly steady. Encouraged to stay close to walker. Tended to get feet outside of walker. Denied dizziness. Tolerated well. To chair. [id number]  Duanne Limerick

## 2011-02-09 NOTE — Discharge Summary (Signed)
Physician Discharge Summary  Patient ID: [name] [name] MRN: [id number] DOB/AGE: [birth year] 77 y.o.  Admit date: 02/08/2011 Discharge date: 02/09/2011  Primary Discharge Diagnosis: Hypertensive urgency Secondary Discharge Diagnosis: * .  Pacemaker - St Jude  08/2010   .  Complete heart block      intermittent   .  BENIGN PROSTATIC HYPERTROPHY  08/09/2006   .  CEREBROVASCULAR ACCIDENT, HX OF  08/09/2006   .  CEREBROVASCULAR DISEASE LATE EFFECTS, NOS  11/25/2006   .  COLONIC POLYPS, HX OF  08/09/2006   .  COPD  01/13/2009   .  CORONARY ARTERY DISEASE  01/12/2008     Drug eluting stent Circumflex 2009, presenting symptom was syncope   .  DYSPNEA  11/21/2008   .  GERD  08/09/2006   .  HYPERLIPIDEMIA  08/09/2006   .  HYPERTENSION, BENIGN  08/13/2008   .  MELENA, HX OF  02/01/2008   .  Memory loss  05/22/2007   .  PHLEBITIS, LOWER EXTREMITY  06/08/2007   .  Polymyalgia rheumatica  08/09/2006   .  SYNCOPE  2009     Secondary to heart block   .  VENTRICULAR TACHYCARDIA  01/12/2008     Nonsustained   .  WEAKNESS  11/21/2008   Surgical History:  Past Surgical History   Procedure  Date   .  Cataract extraction    .  Lumbar laminectomy    .  Transurethral resection of prostate    .  Coronary angioplasty with stent placement     Right CEA     Left hand        Significant Diagnostic Studies: Cardiac catheterization, coronary arteriogram, left ventriculogram  Hospital Course: *[name] [name] is an 77 year old male with a history of coronary artery disease. He had an episode of weakness and diaphoresis. There was concern for possible anginal equivalent as he had no chest pain prior to his stent in 2003. He was admitted for further evaluation.  As his symptoms were concerning for an anginal equivalent, he was taken to the cath lab on 02/08/2011. He had minor luminal irregularities. His EF showed normal left ventricular systolic function but the left ventricle shape was consistent with a hypertensive heart.  His symptoms were evaluated and felt secondary to moderate/severe hypertension. His labetalol was uptitrated aggressively.  On 02/09/2011 [name] was having no more episodes of weakness/diaphoresis. His systolic blood pressure was between 1:30 and 164 which was an improvement. He is being seen by cardiac rehabilitation to assess his blood pressure response to exertion. He had mild pulmonary vascular congestion on chest x-ray but no symptoms of volume overload. His O2 saturation will be checked with exertion to make sure he is not getting hypoxic. If he does well with ambulation, he he is considered stable for discharge to followup as an outpatient   Labs:   Lab Results  Component Value Date   WBC 6.4 02/08/2011   HGB 11.8* 02/08/2011   HCT 35.8* 02/08/2011   MCV 92.7 02/08/2011   PLT 142* 02/08/2011    Lab 02/09/11 0500  NA 143  K 3.8  CL 106  CO2 24  BUN 10  CREATININE 0.92  CALCIUM 9.4  PROT 7.2  BILITOT 0.8  ALKPHOS 55  ALT 12  AST 20  GLUCOSE 104*   Lab Results  Component Value Date   CKTOTAL 119 02/09/2011   CKMB 4.7* 02/09/2011   TROPONINI <0.30 02/09/2011    Lab Results  Component Value Date   CHOL 189 02/09/2011   CHOL 165 01/11/2011   CHOL 209* 09/09/2009   Lab Results  Component Value Date   HDL 65 02/09/2011   HDL 57.50 01/11/2011   HDL 55.70 09/09/2009   Lab Results  Component Value Date   LDLCALC 89 02/09/2011   LDLCALC 89 01/11/2011   LDLCALC 118* 09/04/2008   Lab Results  Component Value Date   TRIG 176* 02/09/2011   TRIG 91.0 01/11/2011   TRIG 112.0 09/09/2009   Lab Results  Component Value Date   CHOLHDL 2.9 02/09/2011   CHOLHDL 3 01/11/2011   CHOLHDL 4 09/09/2009   Lab Results  Component Value Date   LDLDIRECT 133.0 09/09/2009   LDLDIRECT 140.8 08/24/2007      Radiology: Pulmonary vascular congestion EKG: Sinus rhythm, no acute ischemic changes  FOLLOW UP PLANS AND APPOINTMENTS Discharge Orders    Future Appointments:  Provider: Department: Dept Phone: Center:   02/22/2011 10:30 AM Beatrice Lecher, PA [facility] [phone] [facility]   03/08/2011 9:30 AM Rogelia Boga [facility] [phone] [facility]   04/12/2011 8:40 AM Lorenda Cahill [facility] [place] [phone] [facility]   07/28/2011 2:00 PM [facility] [phone] [facility]     Future Orders Please Complete By Expires   Diet - low sodium heart healthy      Increase activity slowly      Call MD for:  redness, tenderness, or signs of infection (pain, swelling, redness, odor or green/yellow discharge around incision site)        Current Discharge Medication List    CONTINUE these medications which have CHANGED   Details  labetalol (NORMODYNE) 200 MG tablet Take 2 tablets (400 mg total) by mouth 2 (two) times daily. Qty: 120 tablet, Refills: 11      CONTINUE these medications which have NOT CHANGED   Details  aspirin EC 81 MG tablet Take 81 mg by mouth daily.      clonazePAM (KLONOPIN) 0.5 MG tablet Take 0.25 mg by mouth daily.      clopidogrel (PLAVIX) 75 MG tablet Take 75 mg by mouth daily.      divalproex (DEPAKOTE) 250 MG DR tablet Take 250 mg by mouth daily.      nitroGLYCERIN (NITROSTAT) 0.4 MG SL tablet Place 0.4 mg under the tongue every 5 (five) minutes as needed. For chest pain     pravastatin (PRAVACHOL) 20 MG tablet Take 20 mg by mouth daily.      ranitidine (ZANTAC) 150 MG tablet Take 150 mg by mouth at bedtime.      rivastigmine (EXELON) 4.6 mg/24hr Place 1 patch onto the skin daily.         Follow-up Information    Follow up with Tereso Newcomer, PA. (December 10th @ 10:30 for Dr Clifton James)    Contact information:   [address] [place] [address] [phone]          BRING ALL MEDICATIONS WITH YOU TO FOLLOW UP APPOINTMENTS  Time spent with patient to include physician time: Signed: Theodore Demark 02/09/2011, 10:14 AM Co-Sign  MD Addendum: This morning after receiving 200 mg of labetalol the patient's systolic blood pressure was approximately 100 in both arms. Because of this his discharge dose of labetalol will be changed from 400-200 mg by mouth twice a day. All other discharge medications and orders are unchanged.

## 2011-02-22 ENCOUNTER — Ambulatory Visit (INDEPENDENT_AMBULATORY_CARE_PROVIDER_SITE_OTHER): Payer: Medicare Other | Admitting: Physician Assistant

## 2011-02-22 ENCOUNTER — Encounter: Payer: Self-pay | Admitting: Physician Assistant

## 2011-02-22 VITALS — BP 99/67 | HR 66 | Ht 70.0 in | Wt 187.4 lb

## 2011-02-22 DIAGNOSIS — I10 Essential (primary) hypertension: Secondary | ICD-10-CM

## 2011-02-22 DIAGNOSIS — I2510 Atherosclerotic heart disease of native coronary artery without angina pectoris: Secondary | ICD-10-CM

## 2011-02-22 DIAGNOSIS — Z950 Presence of cardiac pacemaker: Secondary | ICD-10-CM

## 2011-02-22 MED ORDER — LABETALOL HCL 100 MG PO TABS: ORAL_TABLET | ORAL | Status: DC

## 2011-02-22 NOTE — Progress Notes (Signed)
[address] [place], [place]  [address] Phone: [phone] Fax:  [phone]  Date:  02/22/2011   Name:  [name] [name]       DOB:  06-26-1925 MRN:  [id number]  PCP:  Dr. Amador Cunas Primary Cardiologist:  Dr. Verne Carrow  Primary Electrophysiologist:   Dr. Sherryl Manges    History of Present Illness: [name] [name] is a 77 y.o. male who presents for post hospital follow up.  He has a history of CAD, complete heart block, status post pacemaker, prior stroke, hyperlipidemia, COPD, hypertension and dementia.  He presented in 2009 with ventricular tachycardia and was treated with a drug-eluting stent to the circumflex.  Last Myoview 10/11: No ischemia.  He was admitted 11/26-11/27.  He presented with complaints of weakness and diaphoresis.  This was similar to his previous anginal equivalent and left heart catheterization was recommended.  LHC 02/08/11:  CFX stent patent, LVF normal, left ventricle "spade-shaped" consistent with hypertensive heart disease.  It was felt that his hypertension was the cause of his symptoms his blood pressure medications were adjusted.  Labs: Hemoglobin 11.8, K 3.8, creatinine 0.92, ALT 12, TC 189, HDL 65, LDL 89, TG 176, A1c 6.6, TSH 1.442, BNP 209.  Chest x-ray demonstrated cardiomegaly and pulmonary vascular congestion.  Of note, the patient did not display any signs of volume overload.  He denies chest pain, shortness of breath, syncope, orthopnea, PND or edema.  He denies lightheadedness or dizziness.  He denies fatigue.  He denies difficulty with balance.  His wife states that she checks his blood pressure at home and she's not seen any blood pressure reading over 120 to 130.  Past Medical History  Diagnosis Date  . Pacemaker 08/09/2006    family denies this history  . Complete heart block 11/21/2008    intermittent  . BENIGN PROSTATIC HYPERTROPHY 08/09/2006  . CEREBROVASCULAR ACCIDENT, HX OF 08/09/2006  . COLONIC POLYPS, HX OF  08/09/2006  . COPD 01/13/2009  . CORONARY ARTERY DISEASE 01/12/2008    Drug eluting stent Circumflex 2009;  LHC 02/08/11:  CFX stent patent, LVF normal, left ventricle "spade-shaped" consistent with hypertensive heart disease  . GERD 08/09/2006  . HYPERLIPIDEMIA 08/09/2006  . HYPERTENSION, BENIGN 08/13/2008  . MELENA, HX OF 02/01/2008  . PHLEBITIS, LOWER EXTREMITY 06/08/2007  . Polymyalgia rheumatica 08/09/2006  . SYNCOPE 12/25/2008    Secondary heart block  . VENTRICULAR TACHYCARDIA 01/12/2008    Nonsustained  . WEAKNESS 11/21/2008  . Complication of anesthesia 02/08/11    "? 1966; I think he just quit breathing after Demerol"  . Stroke   . Pneumonia 02/08/11    "walking pneumonia; several years ago"  . Whooping cough     "as a child"  . Cancer   . Arthritis   . DEMENTIA     Current Outpatient Prescriptions  Medication Sig Dispense Refill  . aspirin EC 81 MG tablet Take 81 mg by mouth daily.        . clonazePAM (KLONOPIN) 0.5 MG tablet Take 0.25 mg by mouth daily.        . clopidogrel (PLAVIX) 75 MG tablet Take 75 mg by mouth daily.        . divalproex (DEPAKOTE) 250 MG DR tablet Take 250 mg by mouth daily.        Marland Kitchen labetalol (NORMODYNE) 200 MG tablet Take 1 tablet (200 mg total) by mouth 2 (two) times daily.  120 tablet  11  . pravastatin (PRAVACHOL)  20 MG tablet Take 20 mg by mouth daily.        . ranitidine (ZANTAC) 150 MG tablet Take 150 mg by mouth at bedtime.        . rivastigmine (EXELON) 4.6 mg/24hr Place 1 patch onto the skin daily.          Allergies: Allergies  Allergen Reactions  . Atorvastatin Other (See Comments)    Unknown.  . Meperidine Hcl Other (See Comments)    unknown    History  Substance Use Topics  . Smoking status: Current Everyday Smoker -- 71 years    Types: Cigarettes, Cigars  . Smokeless tobacco: Never Used   Comment: "smokes cigars now; doesn't inhale"  . Alcohol Use: Yes     "used to have glass of wine or beer occasionally; hasn't had one  since ~ 2002"     ROS:  Please see the history of present illness.   All other systems reviewed and negative.   PHYSICAL EXAM: VS:  BP 99/67  Pulse 66  Ht 5\' 10"  (1.778 m)  Wt 187 lb 6.4 oz (85.004 kg)  BMI 26.89 kg/m2 Repeat blood pressure by me: Right 90/60; Left 90/60 Well nourished, well developed, in no acute distress HEENT: normal Neck: no JVD Vascular: No carotid bruits Cardiac:  normal S1, S2; RRR; no murmur Lungs:  clear to auscultation bilaterally, no wheezing, rhonchi or rales Abd: soft, nontender, no hepatomegaly Ext: no edema; Right femoral artery site without hematoma or bruit Skin: warm and dry Neuro:  CNs 2-12 intact, no focal abnormalities noted  EKG:   Sinus rhythm, heart rate 68, normal axis, nonspecific ST-T wave changes  ASSESSMENT AND PLAN:

## 2011-02-22 NOTE — Assessment & Plan Note (Signed)
Blood pressure currently running quite low.  I am concerned about his pressure running as low given his advanced age and other health problems.  I will change his labetalol to 150 mg in morning and 100 mg in the evening.  He already has followup with his PCP in a couple of weeks and his pressure will be assessed again at that time.  I have advised his wife to contact us if his systolic pressure consistently runs over 140 after the medication change.

## 2011-02-22 NOTE — Patient Instructions (Addendum)
Your physician recommends that you schedule a follow-up appointment in: 3 MONTHS WITH DR. Clifton James PER SCOTT WEAVER, PA-C  Your physician has recommended you make the following change in your medication: CHANGE YOUR LABETALOL TO TAKING 150 MG IN THE MORNING THIS WILL 1 AND 1/2 (HALF) TABLETS; TAKE 1 TABLET IN THE EVENING = 100 MG

## 2011-02-22 NOTE — Assessment & Plan Note (Signed)
Follow up with Dr. Sherryl Manges as directed.

## 2011-02-22 NOTE — Assessment & Plan Note (Signed)
Anatomy stable by recent heart catheterization.  He continues on aspirin and Plavix.  Followup with Dr. Verne Carrow in 3 mos.

## 2011-03-08 ENCOUNTER — Ambulatory Visit (INDEPENDENT_AMBULATORY_CARE_PROVIDER_SITE_OTHER): Payer: Medicare Other | Admitting: Internal Medicine

## 2011-03-08 ENCOUNTER — Encounter: Payer: Self-pay | Admitting: Internal Medicine

## 2011-03-08 DIAGNOSIS — I2510 Atherosclerotic heart disease of native coronary artery without angina pectoris: Secondary | ICD-10-CM

## 2011-03-08 DIAGNOSIS — Z8679 Personal history of other diseases of the circulatory system: Secondary | ICD-10-CM

## 2011-03-08 DIAGNOSIS — J4489 Other specified chronic obstructive pulmonary disease: Secondary | ICD-10-CM

## 2011-03-08 DIAGNOSIS — Z950 Presence of cardiac pacemaker: Secondary | ICD-10-CM

## 2011-03-08 DIAGNOSIS — J449 Chronic obstructive pulmonary disease, unspecified: Secondary | ICD-10-CM

## 2011-03-08 DIAGNOSIS — M353 Polymyalgia rheumatica: Secondary | ICD-10-CM

## 2011-03-08 DIAGNOSIS — Z0000 Encounter for general adult medical examination without abnormal findings: Secondary | ICD-10-CM

## 2011-03-08 DIAGNOSIS — I10 Essential (primary) hypertension: Secondary | ICD-10-CM

## 2011-03-08 NOTE — Progress Notes (Signed)
Subjective:    Patient ID: [name] [name], male    DOB: 12-01-1925, 77 y.o.   MRN: [id number]  HPI 77 year old patient who is seen today for a preventive health examination. He was admitted to  the hospital last month for an acute coronary syndrome. He underwent heart catheterization at that time. He has done well since his discharge. Blood pressure has been labile. No cardiopulmonary complaints medical problems include dementia coronary artery disease cerebrovascular disease.    Here for Medicare AWV:   1. Risk factors based on Past M, S, F history: patient has known coronary artery disease  2. Physical Activities: no major activity restrictions  3. Depression/mood: no history of depression. Does have moderate cognitive impairment  4. Hearing: no deficits  5. ADL's: independent in all aspects of daily living  6. Fall Risk: mild to moderate due to age and arthritis  7. Home Safety: no problems identified  8. Height, weight, &visual acuity:height and weight stable. No change in visual acuity  9. Counseling: heart healthy diet. Encouraged  10. Labs ordered based on risk factors: laboratory profile, including lipid profile will be reviewed  11. Referral Coordination- will continue follow-up with cardiology  12. Care Plan- heart healthy diet. An active lifestyle encouraged  13. Cognitive Assessment- moderate impairment- will continue on exelon   Hypertension History:  Positive major cardiovascular risk factors include male age 29 years old or older, hyperlipidemia, hypertension, and current tobacco user.  Positive history for target organ damage include ASHD (either angina/prior MI/prior CABG) and prior stroke (or TIA).   Allergies:  1) ! Lipitor  2) Demerol (Meperidine Hcl)   Past History:  Past Medical History:  Reviewed history from 01/13/2009 and no changes required.   GERD  Benign prostatic hypertrophy  Cerebrovascular accident, hx of right brain stroke in 2008  polymyalgia  rheumatica  Allergic rhinitis  COPD  Colonic polyps, hx of  Hyperlipidemia  memory loss  Coronary artery disease  1. Recent admission for a syncopal episode possibly related to  ischemic arrhythmia.  2. Coronary artery disease status post stenting of a lesion in the  marginal branch of circumflex artery using an endeavor drug-eluting  stent.  Heart catheterization November 2012 3. Good left ventricular function.  4. Nonsustained ventricular tachycardia in the hospital.  5. Hypertension.  6. Hyperlipidemia.  7. Chronic obstructive pulmonary disease.  8. Dementia.   Past Surgical History:   Cataract extraction  Lumbar laminectomy  Transurethral resection of prostate  status post heart catheterization and stenting of the circumflex artery, October 2009 , heart catheterization November 2012 R CEA  carotid artery Doppler ultrasound August 17, 2007, August 22, 2008  colonoscopy Jul 17, 2007  Cardiolite stress test September 2010   Family History:   Reviewed history from 01/25/2008 and no changes required.  father died age 5, prostate cancer  mother died age 59  Two brothers and 5 sisters  two sisters deceased from complications of diabetes  one brother died unclear causes   Social History:   Reviewed history from 09/04/2008 and no changes required.  Retired  Married  Regular exercise-no  remote tobacco     Review of Systems  Constitutional: Positive for fatigue. Negative for fever, chills, activity change and appetite change.  HENT: Negative for hearing loss, ear pain, congestion, rhinorrhea, sneezing, mouth sores, trouble swallowing, neck pain, neck stiffness, dental problem, voice change, sinus pressure and tinnitus.   Eyes: Negative for photophobia, pain, redness and visual disturbance.  Respiratory: Positive for  cough. Negative for apnea, choking, chest tightness, shortness of breath and wheezing.   Cardiovascular: Negative for chest pain, palpitations and leg swelling.   Gastrointestinal: Negative for nausea, vomiting, abdominal pain, diarrhea, constipation, blood in stool, abdominal distention, anal bleeding and rectal pain.  Genitourinary: Negative for dysuria, urgency, frequency, hematuria, flank pain, decreased urine volume, discharge, penile swelling, scrotal swelling, difficulty urinating, genital sores and testicular pain.  Musculoskeletal: Negative for myalgias, back pain, joint swelling, arthralgias and gait problem.  Skin: Negative for color change, rash and wound.  Neurological: Negative for dizziness, tremors, seizures, syncope, facial asymmetry, speech difficulty, weakness, light-headedness, numbness and headaches.  Hematological: Negative for adenopathy. Does not bruise/bleed easily.  Psychiatric/Behavioral: Positive for confusion and decreased concentration. Negative for suicidal ideas, hallucinations, behavioral problems, sleep disturbance, self-injury, dysphoric mood and agitation. The patient is not nervous/anxious.        Objective:   Physical Exam  Constitutional: He appears well-developed and well-nourished.       Blood pressure 100/70  HENT:  Head: Normocephalic and atraumatic.  Right Ear: External ear normal.  Left Ear: External ear normal.  Nose: Nose normal.  Mouth/Throat: Oropharynx is clear and moist.  Eyes: Conjunctivae and EOM are normal. Pupils are equal, round, and reactive to light. No scleral icterus.  Neck: Normal range of motion. Neck supple. No JVD present. No thyromegaly present.       Right carotid endarterectomy scar  Cardiovascular: Regular rhythm, normal heart sounds and intact distal pulses.  Exam reveals no gallop and no friction rub.   No murmur heard.      Pulse 64  Dorsalis pedis pulses intact posterior tibial pulses not easily palpable  Pulmonary/Chest: Effort normal. He has rales. He exhibits no tenderness.       A few bibasilar crackles O2 saturation 97  Abdominal: Soft. Bowel sounds are normal. He  exhibits no distension and no mass. There is no tenderness.  Genitourinary: Prostate normal and penis normal.  Musculoskeletal: Normal range of motion. He exhibits no edema and no tenderness.  Lymphadenopathy:    He has no cervical adenopathy.  Neurological: He is alert. He has normal reflexes. No cranial nerve deficit. Coordination normal.  Skin: Skin is warm and dry. No rash noted.  Psychiatric: He has a normal mood and affect. His behavior is normal.       Moderate cognitive impairment          Assessment & Plan:    Preventive health examination Stable coronary artery disease Hypertension Senile dementia  Medical regimen unchanged Cardiology followup as scheduled  Recheck here 4 months

## 2011-03-08 NOTE — Patient Instructions (Signed)
Limit your sodium (Salt) intake    It is important that you exercise regularly, at least 20 minutes 3 to 4 times per week.  If you develop chest pain or shortness of breath seek  medical attention.  Return in 4 months for follow-up   

## 2011-03-15 ENCOUNTER — Ambulatory Visit (INDEPENDENT_AMBULATORY_CARE_PROVIDER_SITE_OTHER): Payer: Medicare Other | Admitting: Internal Medicine

## 2011-03-15 ENCOUNTER — Encounter: Payer: Self-pay | Admitting: Internal Medicine

## 2011-03-15 DIAGNOSIS — R5383 Other fatigue: Secondary | ICD-10-CM

## 2011-03-15 DIAGNOSIS — J069 Acute upper respiratory infection, unspecified: Secondary | ICD-10-CM

## 2011-03-15 NOTE — Progress Notes (Signed)
  Subjective:    Patient ID: [name] [name], male    DOB: 1925/10/19, 77 y.o.   MRN: [id number]  HPI  77 year old patient who presents with a one-week history of sore throat hoarseness and nonproductive cough he has had some intermittent low-grade fever. His wife has been ill with similar symptoms no shortness of breath wheezing or chest pain.    Review of Systems  Constitutional: Positive for fever and fatigue. Negative for chills and appetite change.  HENT: Positive for sinus pressure. Negative for hearing loss, ear pain, congestion, sore throat, trouble swallowing, neck stiffness, dental problem, voice change and tinnitus.   Eyes: Negative for pain, discharge and visual disturbance.  Respiratory: Positive for cough. Negative for chest tightness, wheezing and stridor.   Cardiovascular: Negative for chest pain, palpitations and leg swelling.  Gastrointestinal: Negative for nausea, vomiting, abdominal pain, diarrhea, constipation, blood in stool and abdominal distention.  Genitourinary: Negative for urgency, hematuria, flank pain, discharge, difficulty urinating and genital sores.  Musculoskeletal: Negative for myalgias, back pain, joint swelling, arthralgias and gait problem.  Skin: Negative for rash.  Neurological: Negative for dizziness, syncope, speech difficulty, weakness, numbness and headaches.  Hematological: Negative for adenopathy. Does not bruise/bleed easily.  Psychiatric/Behavioral: Negative for behavioral problems and dysphoric mood. The patient is not nervous/anxious.        Objective:   Physical Exam  Constitutional: He is oriented to person, place, and time. He appears well-developed and well-nourished. No distress.  HENT:  Head: Normocephalic.  Right Ear: External ear normal.  Left Ear: External ear normal.  Eyes: Conjunctivae and EOM are normal.  Neck: Normal range of motion.  Cardiovascular: Normal rate and normal heart sounds.   Pulmonary/Chest: Breath sounds  normal.       A few crackles at both bases  Abdominal: Bowel sounds are normal.  Musculoskeletal: Normal range of motion. He exhibits no edema and no tenderness.  Neurological: He is alert and oriented to person, place, and time.  Psychiatric: He has a normal mood and affect. His behavior is normal.          Assessment & Plan:    Viral URI. Robitussin with dextromethorphan recommended he will rest and force fluids. He'll call if he develops any clinical worsening

## 2011-03-15 NOTE — Patient Instructions (Signed)
Get plenty of rest, Drink lots of  clear liquids, and use Tylenol or ibuprofen for fever and discomfort.    Robitussin with DM 1 teaspoon every 6 hours as needed for cough

## 2011-04-12 ENCOUNTER — Other Ambulatory Visit (INDEPENDENT_AMBULATORY_CARE_PROVIDER_SITE_OTHER): Payer: Medicare Other | Admitting: *Deleted

## 2011-04-12 DIAGNOSIS — E785 Hyperlipidemia, unspecified: Secondary | ICD-10-CM

## 2011-04-12 LAB — LIPID PANEL
Cholesterol: 150 mg/dL (ref 0–200)
HDL: 53.9 mg/dL (ref >39.00)
Triglycerides: 61 mg/dL (ref 0.0–149.0)

## 2011-04-12 LAB — HEPATIC FUNCTION PANEL
ALT: 13 U/L (ref 0–53)
Albumin: 3.7 g/dL (ref 3.5–5.2)
Total Protein: 6.5 g/dL (ref 6.0–8.3)

## 2011-04-13 ENCOUNTER — Telehealth: Payer: Self-pay | Admitting: Cardiovascular Disease

## 2011-05-18 ENCOUNTER — Encounter: Payer: Self-pay | Admitting: Cardiovascular Disease

## 2011-05-18 ENCOUNTER — Ambulatory Visit (INDEPENDENT_AMBULATORY_CARE_PROVIDER_SITE_OTHER): Payer: Medicare Other | Admitting: Cardiovascular Disease

## 2011-05-18 VITALS — BP 100/66 | HR 64 | Ht 72.0 in | Wt 180.0 lb

## 2011-05-18 DIAGNOSIS — R0602 Shortness of breath: Secondary | ICD-10-CM

## 2011-05-18 DIAGNOSIS — I2510 Atherosclerotic heart disease of native coronary artery without angina pectoris: Secondary | ICD-10-CM

## 2011-05-18 NOTE — Assessment & Plan Note (Signed)
I suspect this is a combination of his underlying lung disease and possibly a small cardiac component. He does have hypertensive heart disease. Will arrange echocardiogram to define LVEF, LV thickness, estimate RVSP, exclude valvular disease. Will also refer to [facility] for pulmonary workup. He may need PFTs and may benefit from bronchodilator therapy given his history.

## 2011-05-18 NOTE — Assessment & Plan Note (Addendum)
Stable. Continue ASA/Plavix/statin. BP is well controlled. Lipids are at goal. Recent left heart cath November 2012 with stable CAD. No changes. He is on labetalol and this may need to be changed depending on his findings with PFTs.

## 2011-05-18 NOTE — Progress Notes (Signed)
History of Present Illness: [name] [name] is 78 yo WM with history of CAD, complete heart block s/p PPM, CVA, HLD, COPD, dementia, HTN, former tobacco abuse here today for cardiac follow up. He has been followed in the past by Dr. Juanda Chance. In 2009 he had a syncopal episode with documented ventricular tachycardia and underwent PCI of the circumflex artery with a drug-eluting stent. His pacemaker is followed by Dr. Graciela Husbands. Stress myoview October 2011 with no evidence of ischemia.   He is not on a beta blocker secondary to intolerance in the past (syncope,weakness). He was admitted to [facility]  11/26-11/27/12. He presented with complaints of weakness and diaphoresis. This was similar to his previous anginal equivalent and left heart catheterization was recommended. LHC 02/08/11: CFX stent patent, LVF normal, left ventricle "spade-shaped" consistent with hypertensive heart disease. It was felt that his hypertension was the cause of his symptoms and his blood pressure medications were adjusted.  He is here today for follow up. He denies any chest pain, palpitations. He has had some SOB lately with exertion. He has a long history of tobacco abuse but is not currently smoking. He has not had a recent pulmonary workup. No dizziness, near syncope or syncope.   He is retired from [employer]. He is taking all meds without problems.   Primary Care Physician: Eleonore Chiquito  Last Lipid Profile: January 2013: Total chol:  150   HDL  54    LDL 84  Past Medical History  Diagnosis Date  . Pacemaker 08/09/2006    family denies this history  . Complete heart block 11/21/2008    intermittent  . BENIGN PROSTATIC HYPERTROPHY 08/09/2006  . CEREBROVASCULAR ACCIDENT, HX OF 08/09/2006  . COLONIC POLYPS, HX OF 08/09/2006  . COPD 01/13/2009  . CORONARY ARTERY DISEASE 01/12/2008    Drug eluting stent Circumflex 2009;  LHC 02/08/11:  CFX stent patent, LVF normal, left ventricle "spade-shaped" consistent with hypertensive heart  disease  . GERD 08/09/2006  . HYPERLIPIDEMIA 08/09/2006  . HYPERTENSION, BENIGN 08/13/2008  . MELENA, HX OF 02/01/2008  . PHLEBITIS, LOWER EXTREMITY 06/08/2007  . Polymyalgia rheumatica 08/09/2006  . SYNCOPE 12/25/2008    Secondary heart block  . VENTRICULAR TACHYCARDIA 01/12/2008    Nonsustained  . WEAKNESS 11/21/2008  . Complication of anesthesia 02/08/11    "? 1966; I think he just quit breathing after Demerol"  . Stroke   . Pneumonia 02/08/11    "walking pneumonia; several years ago"  . Whooping cough     "as a child"  . Cancer   . Arthritis   . DEMENTIA     Past Surgical History  Procedure Date  . Lumbar laminectomy   . Transurethral resection of prostate   . Coronary angioplasty with stent placement   . Cataract extraction w/ intraocular lens  implant, bilateral ~ 2000  . Back surgery   . Insert / replace / remove pacemaker 09/07/10    initial placement  . Carotid endarterectomy     left    Current Outpatient Prescriptions  Medication Sig Dispense Refill  . aspirin EC 81 MG tablet Take 81 mg by mouth daily.        . clonazePAM (KLONOPIN) 0.5 MG tablet Take 0.25 mg by mouth daily.        . clopidogrel (PLAVIX) 75 MG tablet Take 75 mg by mouth daily.        . divalproex (DEPAKOTE) 250 MG DR tablet Take 250 mg by mouth daily.        Marland Kitchen  labetalol (NORMODYNE) 100 MG tablet Take 1 and 1/2 (half) tablet in the morning to = 150 mg ; take 1 tablet in the evening to = 100 mg  120 tablet  11  . pravastatin (PRAVACHOL) 40 MG tablet Take 40 mg by mouth daily.        . ranitidine (ZANTAC) 150 MG tablet Take 150 mg by mouth at bedtime.        . rivastigmine (EXELON) 4.6 mg/24hr Place 1 patch onto the skin daily.          Allergies  Allergen Reactions  . Atorvastatin Other (See Comments)    Unknown.  . Meperidine Hcl Other (See Comments)    unknown    History   Social History  . Marital Status: Married    Spouse Name: N/A    Number of Children: N/A  . Years of Education:  N/A   Occupational History  . Not on file.   Social History Main Topics  . Smoking status: Current Everyday Smoker -- 71 years    Types: Cigarettes, Cigars  . Smokeless tobacco: Never Used   Comment: Pt has not had a cigar in a good while 3-5 2013  . Alcohol Use: Yes     "used to have glass of wine or beer occasionally; hasn't had one since ~ 2002"  . Drug Use: No  . Sexually Active: No   Other Topics Concern  . Not on file   Social History Narrative  . No narrative on file    No family history on file.  Review of Systems:  As stated in the HPI and otherwise negative.   BP 100/66  Pulse 64  Ht 6' (1.829 m)  Wt 180 lb (81.647 kg)  BMI 24.41 kg/m2  Physical Examination: General: Well developed, well nourished, NAD HEENT: OP clear, mucus membranes moist SKIN: warm, dry. No rashes. Neuro: No focal deficits Musculoskeletal: Muscle strength 5/5 all ext Psychiatric: Mood and affect normal Neck: No JVD, no carotid bruits, no thyromegaly, no lymphadenopathy. Lungs:Wheezes bilaterally, no rhonci, crackles Cardiovascular: Regular rate and rhythm. No murmurs, gallops or rubs. Abdomen:Soft. Bowel sounds present. Non-tender.  Extremities: No lower extremity edema. Pulses are 2 + in the bilateral DP/PT.

## 2011-05-18 NOTE — Patient Instructions (Signed)
Your physician wants you to follow-up in: 6 months. You will receive a reminder letter in the mail two months in advance. If you don't receive a letter, please call our office to schedule the follow-up appointment.   Your physician has requested that you have an echocardiogram. Echocardiography is a painless test that uses sound waves to create images of your heart. It provides your doctor with information about the size and shape of your heart and how well your heart's chambers and valves are working. This procedure takes approximately one hour. There are no restrictions for this procedure.   You have been referred to Dr. Marchelle Gearing in the [facility] pulmonary department for evaluation of your shortness of breath

## 2011-05-28 ENCOUNTER — Other Ambulatory Visit: Payer: Self-pay

## 2011-05-28 ENCOUNTER — Ambulatory Visit (HOSPITAL_COMMUNITY): Payer: Medicare Other | Attending: Internal Medicine

## 2011-05-28 DIAGNOSIS — J4489 Other specified chronic obstructive pulmonary disease: Secondary | ICD-10-CM | POA: Insufficient documentation

## 2011-05-28 DIAGNOSIS — J449 Chronic obstructive pulmonary disease, unspecified: Secondary | ICD-10-CM | POA: Insufficient documentation

## 2011-05-28 DIAGNOSIS — R0609 Other forms of dyspnea: Secondary | ICD-10-CM | POA: Insufficient documentation

## 2011-05-28 DIAGNOSIS — R0989 Other specified symptoms and signs involving the circulatory and respiratory systems: Secondary | ICD-10-CM | POA: Insufficient documentation

## 2011-05-28 DIAGNOSIS — I10 Essential (primary) hypertension: Secondary | ICD-10-CM | POA: Insufficient documentation

## 2011-05-28 DIAGNOSIS — I2510 Atherosclerotic heart disease of native coronary artery without angina pectoris: Secondary | ICD-10-CM

## 2011-05-28 DIAGNOSIS — E785 Hyperlipidemia, unspecified: Secondary | ICD-10-CM | POA: Insufficient documentation

## 2011-06-01 ENCOUNTER — Encounter: Payer: Self-pay | Admitting: Internal Medicine

## 2011-06-01 ENCOUNTER — Ambulatory Visit (INDEPENDENT_AMBULATORY_CARE_PROVIDER_SITE_OTHER): Payer: Medicare Other | Admitting: Internal Medicine

## 2011-06-01 VITALS — BP 118/66 | HR 66 | Temp 98.2°F | Ht 72.0 in | Wt 185.0 lb

## 2011-06-01 DIAGNOSIS — R0600 Dyspnea, unspecified: Secondary | ICD-10-CM

## 2011-06-01 DIAGNOSIS — R0609 Other forms of dyspnea: Secondary | ICD-10-CM

## 2011-06-01 MED ORDER — TIOTROPIUM BROMIDE MONOHYDRATE 18 MCG IN CAPS: 18.0000 ug | ORAL_CAPSULE | Freq: Every day | RESPIRATORY_TRACT | Status: DC

## 2011-06-01 NOTE — Progress Notes (Signed)
Subjective:    Patient ID: [name] [name], male    DOB: January 03, 1926, 78 y.o.   MRN: [id number]  HPI  IOV 06/01/2011   78 year old male. Body mass index is 25.09 kg/(m^2).  reports that he quit smoking about 20 years ago. His smoking use included Cigarettes and Cigars. He has a 71 pack-year smoking history. He has never used smokeless tobacco. Referred by Dr Alyson Ingles of cardiology for dyspnea. Accompanied by wife. PEr patient he is aging and really sees no problems beyond normal aging. He is not interested in much interventional RXWife more concerned. Appears he has insidious onset, gradually progressive dyspnea on exertion associated with fatigue. Worse since insertion of pacemaker in summer 2012 for syncope (Dr Graciela Husbands, Sick sinus presumably). Wife notes he is grunting with wheeze during exertional dyspnea. Wife notices he is unable to keep up with her in [employer]. He takes his time to go to mailbox to avoid dyspnea. He gets dyspneic walking from car to MD office. Denies orthopnea, paroxysmal nocturnal dyspnea, edema, associated chest pain or cough or weight loss or hemoptysis. OF note, does not carry diagnosis of copd or fibrosis and is not on MDI Rx.   CXR Nov 2012 - hyperinflated c/w copd Walking desaturation test 06/01/2011 185 feeet x 3 laps: did not desaturate  Note: he is not interested interventional Rx. His wife is more concerned than him. He seems content with his life.  Refused rehab but agreed to mdi     Past Medical History  Diagnosis Date  . Pacemaker 08/09/2006    family denies this history  . Complete heart block 11/21/2008    intermittent  . BENIGN PROSTATIC HYPERTROPHY 08/09/2006  . CEREBROVASCULAR ACCIDENT, HX OF 08/09/2006  . COLONIC POLYPS, HX OF 08/09/2006  . COPD 01/13/2009  . CORONARY ARTERY DISEASE 01/12/2008    Drug eluting stent Circumflex 2009;  LHC 02/08/11:  CFX stent patent, LVF normal, left ventricle "spade-shaped" consistent with hypertensive heart disease  . GERD  08/09/2006  . HYPERLIPIDEMIA 08/09/2006  . HYPERTENSION, BENIGN 08/13/2008  . MELENA, HX OF 02/01/2008  . PHLEBITIS, LOWER EXTREMITY 06/08/2007  . Polymyalgia rheumatica 08/09/2006  . SYNCOPE 12/25/2008    Secondary heart block  . VENTRICULAR TACHYCARDIA 01/12/2008    Nonsustained  . WEAKNESS 11/21/2008  . Complication of anesthesia 02/08/11    "? 1966; I think he just quit breathing after Demerol"  . Stroke   . Pneumonia 02/08/11    "walking pneumonia; several years ago"  . Whooping cough     "as a child"  . Cancer   . Arthritis   . DEMENTIA      Family History  Problem Relation Age of Onset  . Heart disease Father      History   Social History  . Marital Status: Married    Spouse Name: N/A    Number of Children: N/A  . Years of Education: N/A   Occupational History  . retired    Social History Main Topics  . Smoking status: Former Smoker -- 1.0 packs/day for 71 years    Types: Cigarettes, Cigars    Quit date: 03/16/1991  . Smokeless tobacco: Never Used   Comment: Pt has not had a cigar in a good while 3-5 2013  . Alcohol Use: Yes     "used to have glass of wine or beer occasionally; hasn't had one since ~ 2002"  . Drug Use: No  . Sexually Active: No   Other Topics  Concern  . Not on file   Social History Narrative  . No narrative on file     Allergies  Allergen Reactions  . Atorvastatin Other (See Comments)    Unknown.  . Meperidine Hcl Other (See Comments)    unknown     Outpatient Prescriptions Prior to Visit  Medication Sig Dispense Refill  . aspirin EC 81 MG tablet Take 81 mg by mouth daily.        . clonazePAM (KLONOPIN) 0.5 MG tablet Take 0.25 mg by mouth daily.        . clopidogrel (PLAVIX) 75 MG tablet Take 75 mg by mouth daily.        . divalproex (DEPAKOTE) 250 MG DR tablet Take 250 mg by mouth daily.        Marland Kitchen labetalol (NORMODYNE) 100 MG tablet Take 1 and 1/2 (half) tablet in the morning to = 150 mg ; take 1 tablet in the evening to = 100 mg   120 tablet  11  . pravastatin (PRAVACHOL) 40 MG tablet Take 40 mg by mouth daily.        . ranitidine (ZANTAC) 150 MG tablet Take 150 mg by mouth at bedtime.        . rivastigmine (EXELON) 4.6 mg/24hr Place 1 patch onto the skin daily.             Review of Systems  Constitutional: Negative for fever and unexpected weight change.  HENT: Positive for congestion. Negative for ear pain, nosebleeds, sore throat, rhinorrhea, sneezing, trouble swallowing, dental problem, postnasal drip and sinus pressure.   Eyes: Negative for redness and itching.  Respiratory: Positive for shortness of breath and wheezing. Negative for cough and chest tightness.   Cardiovascular: Negative for palpitations and leg swelling.  Gastrointestinal: Negative for nausea and vomiting.  Genitourinary: Negative for dysuria.  Musculoskeletal: Negative for joint swelling.  Skin: Negative for rash.  Neurological: Negative for headaches.  Hematological: Does not bruise/bleed easily.  Psychiatric/Behavioral: Negative for dysphoric mood. The patient is not nervous/anxious.        Objective:   Physical Exam  Nursing note and vitals reviewed. Constitutional: He is oriented to person, place, and time. He appears well-developed and well-nourished. No distress.       Looks stated age  HENT:  Head: Normocephalic and atraumatic.  Right Ear: External ear normal.  Left Ear: External ear normal.  Mouth/Throat: Oropharynx is clear and moist. No oropharyngeal exudate.  Eyes: Conjunctivae and EOM are normal. Pupils are equal, round, and reactive to light. Right eye exhibits no discharge. Left eye exhibits no discharge. No scleral icterus.  Neck: Normal range of motion. Neck supple. No JVD present. No tracheal deviation present. No thyromegaly present.  Cardiovascular: Normal rate, regular rhythm and intact distal pulses.  Exam reveals no gallop and no friction rub.   No murmur heard. Pulmonary/Chest: Effort normal and breath  sounds normal. No respiratory distress. He has no wheezes. He has no rales. He exhibits no tenderness.       Mild kyphosis +  Abdominal: Soft. Bowel sounds are normal. He exhibits no distension and no mass. There is no tenderness. There is no rebound and no guarding.  Musculoskeletal: Normal range of motion. He exhibits no edema and no tenderness.  Lymphadenopathy:    He has no cervical adenopathy.  Neurological: He is alert and oriented to person, place, and time. He has normal reflexes. No cranial nerve deficit. Coordination normal.       ?  Mild dementia. He forgot his wife told me he had dyspnea  Skin: Skin is warm and dry. No rash noted. He is not diaphoretic. No erythema. No pallor.  Psychiatric: He has a normal mood and affect. His behavior is normal. Judgment and thought content normal.          Assessment & Plan:

## 2011-06-01 NOTE — Patient Instructions (Signed)
I think you have some copd from prior smoking Please start spiriva 1 puff daily - take sample, script and show technique Return in 1 month to report progress AT followup will do spirometry test Come sooner if there are problems

## 2011-06-01 NOTE — Assessment & Plan Note (Signed)
Most likely dyspnea is due to copd, deconditioning. Pacer possibly playing a role as well. COPD probably moderate-severe; did not desaturate with exertion. I recommended rehab to start with it but he is not interested. In fact, he denies dyspnea . He is agreeable to trial of spiriva inhaler. I will see him back in 1 month with spirometry

## 2011-06-01 NOTE — Assessment & Plan Note (Signed)
IN a matter of 5 minutes he forgot his wife gave me a dyspnea history. He is on exelon patch. He is not interested in rehab. AT followup his goals of care needs to be explored

## 2011-06-04 ENCOUNTER — Other Ambulatory Visit: Payer: Self-pay | Admitting: Internal Medicine

## 2011-07-06 ENCOUNTER — Emergency Department (HOSPITAL_COMMUNITY): Payer: Medicare Other

## 2011-07-06 ENCOUNTER — Encounter (HOSPITAL_COMMUNITY): Payer: Self-pay | Admitting: *Deleted

## 2011-07-06 ENCOUNTER — Emergency Department (HOSPITAL_COMMUNITY)
Admission: EM | Admit: 2011-07-06 | Discharge: 2011-07-06 | Disposition: A | Discharge status: Home / Self Care | Payer: Medicare Other | Attending: Emergency Medicine | Admitting: Emergency Medicine

## 2011-07-06 ENCOUNTER — Telehealth: Payer: Self-pay | Admitting: Internal Medicine

## 2011-07-06 DIAGNOSIS — R5381 Other malaise: Secondary | ICD-10-CM | POA: Insufficient documentation

## 2011-07-06 DIAGNOSIS — E785 Hyperlipidemia, unspecified: Secondary | ICD-10-CM | POA: Insufficient documentation

## 2011-07-06 DIAGNOSIS — M129 Arthropathy, unspecified: Secondary | ICD-10-CM | POA: Insufficient documentation

## 2011-07-06 DIAGNOSIS — R55 Syncope and collapse: Secondary | ICD-10-CM

## 2011-07-06 DIAGNOSIS — R61 Generalized hyperhidrosis: Secondary | ICD-10-CM | POA: Insufficient documentation

## 2011-07-06 DIAGNOSIS — Z950 Presence of cardiac pacemaker: Secondary | ICD-10-CM | POA: Insufficient documentation

## 2011-07-06 DIAGNOSIS — Z79899 Other long term (current) drug therapy: Secondary | ICD-10-CM | POA: Insufficient documentation

## 2011-07-06 DIAGNOSIS — Z7982 Long term (current) use of aspirin: Secondary | ICD-10-CM | POA: Insufficient documentation

## 2011-07-06 DIAGNOSIS — R42 Dizziness and giddiness: Secondary | ICD-10-CM | POA: Insufficient documentation

## 2011-07-06 DIAGNOSIS — J449 Chronic obstructive pulmonary disease, unspecified: Secondary | ICD-10-CM | POA: Insufficient documentation

## 2011-07-06 DIAGNOSIS — K219 Gastro-esophageal reflux disease without esophagitis: Secondary | ICD-10-CM | POA: Insufficient documentation

## 2011-07-06 DIAGNOSIS — R079 Chest pain, unspecified: Secondary | ICD-10-CM | POA: Insufficient documentation

## 2011-07-06 DIAGNOSIS — Z8673 Personal history of transient ischemic attack (TIA), and cerebral infarction without residual deficits: Secondary | ICD-10-CM | POA: Insufficient documentation

## 2011-07-06 DIAGNOSIS — I10 Essential (primary) hypertension: Secondary | ICD-10-CM | POA: Insufficient documentation

## 2011-07-06 DIAGNOSIS — J4489 Other specified chronic obstructive pulmonary disease: Secondary | ICD-10-CM | POA: Insufficient documentation

## 2011-07-06 DIAGNOSIS — F0390 Unspecified dementia without behavioral disturbance: Secondary | ICD-10-CM | POA: Insufficient documentation

## 2011-07-06 DIAGNOSIS — Z9889 Other specified postprocedural states: Secondary | ICD-10-CM | POA: Insufficient documentation

## 2011-07-06 DIAGNOSIS — I2510 Atherosclerotic heart disease of native coronary artery without angina pectoris: Secondary | ICD-10-CM | POA: Insufficient documentation

## 2011-07-06 LAB — COMPREHENSIVE METABOLIC PANEL
AST: 17 U/L (ref 0–37)
Albumin: 3.5 g/dL (ref 3.5–5.2)
BUN: 13 mg/dL (ref 6–23)
Calcium: 9 mg/dL (ref 8.4–10.5)
Chloride: 105 mEq/L (ref 96–112)
Creatinine, Ser: 1.04 mg/dL (ref 0.50–1.35)
Total Protein: 6.3 g/dL (ref 6.0–8.3)

## 2011-07-06 LAB — DIFFERENTIAL
Basophils Absolute: 0 10*3/uL (ref 0.0–0.1)
Basophils Relative: 1 % (ref 0–1)
Eosinophils Absolute: 0.2 10*3/uL (ref 0.0–0.7)
Monocytes Absolute: 0.4 10*3/uL (ref 0.1–1.0)
Monocytes Relative: 7 % (ref 3–12)
Neutro Abs: 3.7 10*3/uL (ref 1.7–7.7)

## 2011-07-06 LAB — CBC
HCT: 33.9 % — ABNORMAL LOW (ref 39.0–52.0)
Hemoglobin: 11.2 g/dL — ABNORMAL LOW (ref 13.0–17.0)
MCH: 29.9 pg (ref 26.0–34.0)
MCHC: 33 g/dL (ref 30.0–36.0)
RDW: 13.8 % (ref 11.5–15.5)

## 2011-07-06 LAB — CARDIAC PANEL(CRET KIN+CKTOT+MB+TROPI)
CK, MB: 3.6 ng/mL (ref 0.3–4.0)
CK, MB: 3.6 ng/mL (ref 0.3–4.0)
Relative Index: INVALID (ref 0.0–2.5)
Total CK: 90 U/L (ref 7–232)
Troponin I: <0.3 ng/mL (ref <0.30)

## 2011-07-06 LAB — PROTIME-INR
INR: 1.08 (ref 0.00–1.49)
Prothrombin Time: 14.2 seconds (ref 11.6–15.2)

## 2011-07-06 MED ORDER — SODIUM CHLORIDE 0.9 % IV BOLUS (SEPSIS)
1000.0000 mL | Freq: Once | INTRAVENOUS | Status: AC
  Administered 2011-07-06: 1000 mL via INTRAVENOUS

## 2011-07-06 NOTE — ED Provider Notes (Signed)
History     CSN: [id number]  Arrival date & time 07/06/11  1526   First MD Initiated Contact with Patient 07/06/11 1532      Chief Complaint  Patient presents with  . Chest Pain  . Shortness of Breath    (Consider location/radiation/quality/duration/timing/severity/associated sxs/prior treatment) HPI Comments: [name] [name] is 78 yo WM with history of CAD, complete heart block s/p PPM, CVA, HLD, COPD, dementia, HTN, former tobacco abuse here today for cardiac follow up. LHC 02/08/11: CFX stent patent, LVF normal, left ventricle "spade-shaped" consistent with hypertensive heart disease.  Patient was working outside today for about 45 minutes went into the house he had sudden onset of weakness. He had to sit down now feels back to baseline. Contrary to triage note he denies shortness of breath, chest pain, nausea or vomiting. He denies any vertigo he said he felt generally lightheaded no abdominal pain, back pain, weakness, numbness or tingling. He feels back to baseline now. EMS report he was cyanotic around his lips,  The history is provided by the patient.    Past Medical History  Diagnosis Date  . Pacemaker 08/09/2006    family denies this history  . Complete heart block 11/21/2008    intermittent  . BENIGN PROSTATIC HYPERTROPHY 08/09/2006  . CEREBROVASCULAR ACCIDENT, HX OF 08/09/2006  . COLONIC POLYPS, HX OF 08/09/2006  . COPD 01/13/2009  . CORONARY ARTERY DISEASE 01/12/2008    Drug eluting stent Circumflex 2009;  LHC 02/08/11:  CFX stent patent, LVF normal, left ventricle "spade-shaped" consistent with hypertensive heart disease  . GERD 08/09/2006  . HYPERLIPIDEMIA 08/09/2006  . HYPERTENSION, BENIGN 08/13/2008  . MELENA, HX OF 02/01/2008  . PHLEBITIS, LOWER EXTREMITY 06/08/2007  . Polymyalgia rheumatica 08/09/2006  . SYNCOPE 12/25/2008    Secondary heart block  . VENTRICULAR TACHYCARDIA 01/12/2008    Nonsustained  . WEAKNESS 11/21/2008  . Complication of anesthesia 02/08/11    "?  1966; I think he just quit breathing after Demerol"  . Stroke   . Pneumonia 02/08/11    "walking pneumonia; several years ago"  . Whooping cough     "as a child"  . Cancer   . Arthritis   . DEMENTIA     Past Surgical History  Procedure Date  . Lumbar laminectomy   . Transurethral resection of prostate   . Coronary angioplasty with stent placement   . Cataract extraction w/ intraocular lens  implant, bilateral ~ 2000  . Back surgery   . Insert / replace / remove pacemaker 09/07/10    initial placement  . Carotid endarterectomy     left    Family History  Problem Relation Age of Onset  . Heart disease Father     History  Substance Use Topics  . Smoking status: Former Smoker -- 1.0 packs/day for 71 years    Types: Cigarettes, Cigars    Quit date: 03/16/1991  . Smokeless tobacco: Never Used   Comment: Pt has not had a cigar in a good while 3-5 2013  . Alcohol Use: Yes     "used to have glass of wine or beer occasionally; hasn't had one since ~ 2002"      Review of Systems  Constitutional: Positive for diaphoresis and fatigue. Negative for fever and activity change.  HENT: Negative for congestion, sore throat and rhinorrhea.   Eyes: Negative for visual disturbance.  Respiratory: Positive for shortness of breath. Negative for cough and chest tightness.   Cardiovascular: Positive for chest pain.  Gastrointestinal: Negative for nausea, vomiting and abdominal pain.  Genitourinary: Negative for dysuria and hematuria.  Musculoskeletal: Negative for gait problem.  Skin: Negative for rash.  Neurological: Positive for dizziness and light-headedness. Negative for weakness and headaches.    Allergies  Atorvastatin and Meperidine hcl  Home Medications   Current Outpatient Rx  Name Route Sig Dispense Refill  . ASPIRIN EC 81 MG PO TBEC Oral Take 81 mg by mouth daily.      Marland Kitchen CLONAZEPAM 0.5 MG PO TABS Oral Take 0.25 mg by mouth daily.      Marland Kitchen CLOPIDOGREL BISULFATE 75 MG PO  TABS Oral Take 75 mg by mouth daily.      Marland Kitchen DIVALPROEX SODIUM 250 MG PO TBEC Oral Take 250 mg by mouth daily.      Marland Kitchen LABETALOL HCL 100 MG PO TABS Oral Take 100-200 mg by mouth 2 (two) times daily. Take 1 and 1/2 (half) tablet in the morning to = 150 mg ; take 1 tablet in the evening to = 100 mg    . PRAVASTATIN SODIUM 40 MG PO TABS Oral Take 40 mg by mouth at bedtime.     Marland Kitchen RANITIDINE HCL 150 MG PO TABS Oral Take 150 mg by mouth at bedtime.      Marland Kitchen RIVASTIGMINE 4.6 MG/24HR TD PT24 Transdermal Place 1 patch onto the skin daily.    Marland Kitchen TIOTROPIUM BROMIDE MONOHYDRATE 18 MCG IN CAPS Inhalation Place 1 capsule (18 mcg total) into inhaler and inhale daily. 30 capsule 3    BP 152/72  Pulse 77  Temp(Src) 98.1 F (36.7 C) (Oral)  Resp 18  SpO2 97%  Physical Exam  Constitutional: He is oriented to person, place, and time. He appears well-developed and well-nourished. No distress.  HENT:  Head: Normocephalic and atraumatic.  Mouth/Throat: No oropharyngeal exudate.  Neck: Normal range of motion. Neck supple.  Cardiovascular: Normal rate, regular rhythm and normal heart sounds.   No murmur heard. Pulmonary/Chest: Effort normal and breath sounds normal. No respiratory distress.  Abdominal: Soft. There is no tenderness. There is no rebound and no guarding.  Musculoskeletal: Normal range of motion. He exhibits no edema and no tenderness.  Neurological: He is alert and oriented to person, place, and time. No cranial nerve deficit.  Skin: Skin is warm.    ED Course  Procedures (including critical care time)  Labs Reviewed  CBC - Abnormal; Notable for the following:    RBC 3.75 (*)    Hemoglobin 11.2 (*)    HCT 33.9 (*)    Platelets 134 (*)    All other components within normal limits  COMPREHENSIVE METABOLIC PANEL - Abnormal; Notable for the following:    Glucose, Bld 134 (*)    GFR calc non Af Amer 64 (*)    GFR calc Af Amer 74 (*)    All other components within normal limits  DIFFERENTIAL    CARDIAC PANEL(CRET KIN+CKTOT+MB+TROPI)  PROTIME-INR  PRO B NATRIURETIC PEPTIDE  CARDIAC PANEL(CRET KIN+CKTOT+MB+TROPI)  LAB REPORT - SCANNED   Dg Chest Portable 1 View  07/06/2011  *RADIOLOGY REPORT*  Clinical Data: Chest pain.  Acute onset of weakness.  PORTABLE CHEST - 1 VIEW  Comparison: Plain film chest 02/08/2011.  Findings: There is cardiomegaly without pulmonary edema.  Lungs are clear.  No pneumothorax or effusion.  IMPRESSION: Cardiomegaly without acute disease.  Original Report Authenticated By: Bernadene Bell. D'ALESSIO, M.D.     1. Diaphoresis   2. Near syncope  MDM  Near syncope with generalized weakness, lightheadedness, now back to baseline. Vitals stable. No chest pain, shortness of breath, nausea or vomiting. EKG nonischemic. Will interrogate pacemaker  Interrogation of pacemaker shows no arrythmias today. Patient has had a few second episodes of tachyarrhythmias greater than 150 the last one was on April 19.  Troponin negative, electrolytes stable.  No obvious orthostasis. Hemoglobin stable. Tolerating pO and feeling better and ambulating.  Suspect vasovagal near syncope.  D/with Dr. Gala Romney who as evaluated patient and does not suspect cardiac etiology.  Stable for PCP followup tomorrow.     Date: 07/06/2011  Rate: 68  Rhythm: normal sinus rhythm  QRS Axis: normal  Intervals: normal  ST/T Wave abnormalities: normal  Conduction Disutrbances:none  Narrative Interpretation:   Old EKG Reviewed: unchanged    Glynn Octave, MD 07/07/11 1120

## 2011-07-06 NOTE — Telephone Encounter (Signed)
Dr. Amador Cunas informed

## 2011-07-06 NOTE — ED Notes (Signed)
Pt was outside and had sudden onset of sob, chest tightness, became diaphoretic and pale.  Fire stated that patient was cyanotic around his lips.  Pt 12 lead negative.  Pt has stent and pacer.    Pt sts feels better now and pain free.

## 2011-07-06 NOTE — H&P (Signed)
History and Physical  Patient ID: [name] [name] MRN: [id number], SOB: 18-Nov-1925 78 y.o. Date of Encounter: 07/06/2011, 6:20 PM  Primary Physician: Rogelia Boga, MD, MD Primary Cardiologist: Dr. McAlhany/Dr. Graciela Husbands  Chief Complaint: Near syncope  HPI: 78 y.o. male w/ PMHx significant for CAD s/p PCI, syncope, NSVT, CHB s/p St. Jude PPM, CVA, HTN, HLD, and dementia who presented to [facility] on 07/06/2011 with complaints of near syncope.  In 2009 he had a syncopal episode with documented ventricular tachycardia and underwent PCI of the circumflex artery with a drug-eluting stent. In 2012 he was found to have intermittent CHB for which a St. Jude PPM was placed. He was admitted to [facility] 11/26-11/27/12 for complaints of weakness and diaphoresis that was similar to his previous anginal equivalent. Left heart catheterization was performed revealing: CFX stent patent, LVF normal, left ventricle "spade-shaped" consistent with hypertensive heart disease. It was felt that his hypertension was the cause of his symptoms and his blood pressure medications were adjusted.  He is a poor historian. His wife is at the bedside and she reported he was outside for about to feed the birds and came inside because he became severely diaphoretic. He denied chest pain, dizziness, palpitations, or syncope. He denies fever, chills, cough, edema, abd pain, diarrhea, or melena/hematochezia. She stated the same thing happened last Saturday while he was outside picking up sticks, but she placed a cold wash cloth on him and he felt better.   In the ED, EKG revealed SR w/ occ atrial pacing 68bpm, TWI V1, otherwise unchanged from prior EKG. CXR was without acute cardiopulmonary abnormalities. Labs significant for stable anemia (H&H 11.2/33.9), WBC 5.3, CMET WNL, including blood glucose 134, pBNP 250.8, and normal cardiac enzymes. BP 130s, HR 70s. PPM interrogated showing no ventricular  arrhythmias and occasional atrial fibrillation (<1% burden) with the last episode on April 19th. He is feeling well now without complaints and has an appointment with his PCP tomorrow.   Past Medical History  Diagnosis Date  . Pacemaker 08/09/2006    family denies this history  . Complete heart block 11/21/2008    intermittent  . BENIGN PROSTATIC HYPERTROPHY 08/09/2006  . CEREBROVASCULAR ACCIDENT, HX OF 08/09/2006  . COLONIC POLYPS, HX OF 08/09/2006  . COPD 01/13/2009  . CORONARY ARTERY DISEASE 01/12/2008    Drug eluting stent Circumflex 2009;  LHC 02/08/11:  CFX stent patent, LVF normal, left ventricle "spade-shaped" consistent with hypertensive heart disease  . GERD 08/09/2006  . HYPERLIPIDEMIA 08/09/2006  . HYPERTENSION, BENIGN 08/13/2008  . MELENA, HX OF 02/01/2008  . PHLEBITIS, LOWER EXTREMITY 06/08/2007  . Polymyalgia rheumatica 08/09/2006  . SYNCOPE 12/25/2008    Secondary heart block  . VENTRICULAR TACHYCARDIA 01/12/2008    Nonsustained  . WEAKNESS 11/21/2008  . Complication of anesthesia 02/08/11    "? 1966; I think he just quit breathing after Demerol"  . Stroke   . Pneumonia 02/08/11    "walking pneumonia; several years ago"  . Whooping cough     "as a child"  . Cancer   . Arthritis   . DEMENTIA      Surgical History:  Past Surgical History  Procedure Date  . Lumbar laminectomy   . Transurethral resection of prostate   . Coronary angioplasty with stent placement   . Cataract extraction w/ intraocular lens  implant, bilateral ~ 2000  . Back surgery   . Insert / replace / remove pacemaker 09/07/10    initial  placement  . Carotid endarterectomy     left     Home Meds: Medication Sig  aspirin EC 81 MG tablet Take 81 mg by mouth daily.    clonazePAM (KLONOPIN) 0.5 MG tablet Take 0.25 mg by mouth daily.    clopidogrel (PLAVIX) 75 MG tablet Take 75 mg by mouth daily.    divalproex (DEPAKOTE) 250 MG DR tablet Take 250 mg by mouth daily.    labetalol (NORMODYNE) 100 MG  tablet Take 100-200 mg by mouth 2 (two) times daily. Take 1 and 1/2 (half) tablet in the morning to = 150 mg ; take 1 tablet in the evening to = 100 mg  pravastatin (PRAVACHOL) 40 MG tablet Take 40 mg by mouth at bedtime.   ranitidine (ZANTAC) 150 MG tablet Take 150 mg by mouth at bedtime.    rivastigmine (EXELON) 4.6 mg/24hr Place 1 patch onto the skin daily.  tiotropium (SPIRIVA HANDIHALER) 18 MCG inhalation capsule Place 1 capsule (18 mcg total) into inhaler and inhale daily.    Allergies:  Allergies  Allergen Reactions  . Atorvastatin Other (See Comments)    Unknown.  . Meperidine Hcl Other (See Comments)    unknown    History   Social History  . Marital Status: Married    Spouse Name: N/A    Number of Children: N/A  . Years of Education: N/A   Occupational History  . retired    Social History Main Topics  . Smoking status: Former Smoker -- 1.0 packs/day for 71 years    Types: Cigarettes, Cigars    Quit date: 03/16/1991  . Smokeless tobacco: Never Used   Comment: Pt has not had a cigar in a good while 3-5 2013  . Alcohol Use: Yes     "used to have glass of wine or beer occasionally; hasn't had one since ~ 2002"  . Drug Use: No  . Sexually Active: No   Other Topics Concern  . Not on file   Social History Narrative  . No narrative on file     Family History  Problem Relation Age of Onset  . Heart disease Father     Review of Systems: General: negative for chills, fever, night sweats or weight changes.  Cardiovascular: As per HPI Dermatological: negative for rash Respiratory: negative for cough or wheezing Urologic: negative for hematuria Abdominal: negative for nausea, vomiting, diarrhea, bright red blood per rectum, melena, or hematemesis Neurologic: negative for visual changes, or dizziness All other systems reviewed and are otherwise negative except as noted above.  Labs:   Component Value Date   WBC 5.3 07/06/2011   HGB 11.2* 07/06/2011   HCT 33.9*  07/06/2011   MCV 90.4 07/06/2011   PLT 134* 07/06/2011     07/06/2011 15:41  Prothrombin Time 14.2  INR 1.08   Lab 07/06/11 1541  NA 140  K 4.6  CL 105  CO2 29  BUN 13  CREATININE 1.04  CALCIUM 9.0  PROT 6.3  BILITOT 0.3  ALKPHOS 46  ALT 11  AST 17  GLUCOSE 134*   Basename 07/06/11 1542  CKTOTAL 90  CKMB 3.6  TROPONINI <0.30     07/06/2011 15:42  Pro B Natriuretic peptide (BNP) 250.8    Radiology/Studies:   07/06/2011 - CXR Findings: There is cardiomegaly without pulmonary edema.  Lungs are clear.  No pneumothorax or effusion.  IMPRESSION: Cardiomegaly without acute disease.    EKG: 07/06/11 @ 1528 - SR w/ occ atrial pacing 68bpm, TWI  V1, otherwise unchanged from prior EKG   Physical Exam: Blood pressure 138/75, pulse 72, temperature 98.1 F (36.7 C), temperature source Oral, resp. rate 17, SpO2 99.00%. General: Pleasant elderly male in no acute distress. Head: Normocephalic, atraumatic, sclera non-icteric, nares are without discharge Neck: Right CEA scar. Supple. Negative for carotid bruits. JVD not elevated. Lungs: Clear bilaterally to auscultation without wheezes, rales, or rhonchi. Breathing is unlabored. Heart: RRR with S1 S2. No obvious murmurs, rubs, or gallops appreciated. Abdomen: Soft, non-tender, non-distended with normoactive bowel sounds. No rebound/guarding. No obvious abdominal masses. Msk:  Strength and tone appear normal for age. Extremities: No edema. No clubbing or cyanosis. Distal pedal pulses are 2+ and equal bilaterally. Neuro: Alert and oriented. Moves all extremities spontaneously. Psych:  Responds to questions appropriately with a normal affect.    ASSESSMENT AND PLAN:  78 y.o. male w/ PMHx significant for CAD s/p PCI, syncope, NSVT, CHB s/p St. Jude PPM, CVA, HTN, HLD, and dementia who presented to [facility] on 07/06/2011 with complaints of near syncope.  1. Near Syncope 2. H/o CAD s/p PCI '09, patent stent by cath 01/2011 3.  H/o syncope and CHB s/p St. Jude PPM  See MD note for full assessment and plan  Signed, HOPE, JESSICA PA-C 07/06/2011, 6:20 PM  Patient seen and examined with [facility]. Pacemaker intracardiac electrograms and report reviewed personally. We discussed all aspects of the encounter. I agree with the assessment and plan as stated above.   78 y/o man as above present with episode of diaphoresis while working in yard. No associated symptoms. Recent cath reviewed and revealed stable revascularization. Labs and pacemaker interrogation are normal. (he has had several brief episodes of what look like possible AF with a controlled ventricular rate. Last episode 4/19 which lasted 8 seconds. No episodes today. Blood sugar ok.  Overall unclear of what caused his episode of diaphoresis but doubt cardiac (or other dangerous) etiology given above findings. OK to d/c home. Have instructed him to eat and drink a small snack prior to going outside to be in the yard. Well f/u with Dr. Amador Cunas tomorrow. Can f/u with EP regarding possible atrial ectopy on pacer.   [name] Krone,MD 7:13 PM

## 2011-07-06 NOTE — ED Notes (Signed)
St.Judes rep paged for pacemaker interrogation

## 2011-07-06 NOTE — Discharge Instructions (Signed)
Near-Syncope There is no signs or heart attack or pacemaker problem today.  This followup with her doctor tomorrow. Return to ED feel they're worsening symptoms  Near-syncope is sudden weakness, dizziness, or feeling like you might pass out (faint). This may occur when getting up after sitting or while standing for a long period of time. Near-syncope can be caused by a drop in blood pressure. This is a common reaction, but it may occur to a greater degree in people taking medicines to control their blood pressure. Fainting often occurs when the blood pressure or pulse is too low to provide enough blood flow to the brain to keep you conscious. Fainting and near-syncope are not usually due to serious medical problems. However, certain people should be more cautious in the event of near-syncope, including elderly patients, patients with diabetes, and patients with a history of heart conditions (especially irregular rhythms).  CAUSES   Drop in blood pressure.   Physical pain.   Dehydration.   Heat exhaustion.   Emotional distress.   Low blood sugar.   Internal bleeding.   Heart and circulatory problems.   Infections.  SYMPTOMS   Dizziness.   Feeling sick to your stomach (nauseous).   Nearly fainting.   Body numbness.   Turning pale.   Tunnel vision.   Weakness.  HOME CARE INSTRUCTIONS   Lie down right away if you start feeling like you might faint. Breathe deeply and steadily. Wait until all the symptoms have passed. Most of these episodes last only a few minutes. You may feel tired for several hours.   Drink enough fluids to keep your urine clear or pale yellow.   If you are taking blood pressure or heart medicine, get up slowly, taking several minutes to sit and then stand. This can reduce dizziness that is caused by a drop in blood pressure.  SEEK IMMEDIATE MEDICAL CARE IF:   You have a severe headache.   Unusual pain develops in the chest, abdomen, or back.   There  is bleeding from the mouth or rectum, or you have black or tarry stool.   An irregular heartbeat or a very rapid pulse develops.   You have repeated fainting or seizure-like jerking during an episode.   You faint when sitting or lying down.   You develop confusion.   You have difficulty walking.   Severe weakness develops.   Vision problems develop.  MAKE SURE YOU:   Understand these instructions.   Will watch your condition.   Will get help right away if you are not doing well or get worse.  Document Released: 03/01/2005 Document Revised: 02/18/2011 Document Reviewed: 04/17/2010 [facility] Patient Information 2012 [place], [place].

## 2011-07-06 NOTE — Telephone Encounter (Signed)
Pts wife called and said that she has Ambulance pick up spouse and take to ER at [facility]. Pt was sweating excessively and is extremely weak. Pt and wife are scheduled to come in tomorrow morning to see Dr Amador Cunas for ov, but at this time, they do not know if they will be able to keep appt or not. Just wanted to make pcp aware.

## 2011-07-07 ENCOUNTER — Ambulatory Visit (INDEPENDENT_AMBULATORY_CARE_PROVIDER_SITE_OTHER): Payer: Medicare Other | Admitting: Internal Medicine

## 2011-07-07 ENCOUNTER — Encounter: Payer: Self-pay | Admitting: Internal Medicine

## 2011-07-07 VITALS — BP 100/80 | HR 76 | Temp 97.4°F | Wt 189.0 lb

## 2011-07-07 DIAGNOSIS — Z950 Presence of cardiac pacemaker: Secondary | ICD-10-CM

## 2011-07-07 DIAGNOSIS — Z8679 Personal history of other diseases of the circulatory system: Secondary | ICD-10-CM

## 2011-07-07 DIAGNOSIS — J449 Chronic obstructive pulmonary disease, unspecified: Secondary | ICD-10-CM

## 2011-07-07 DIAGNOSIS — I2510 Atherosclerotic heart disease of native coronary artery without angina pectoris: Secondary | ICD-10-CM

## 2011-07-07 DIAGNOSIS — R569 Unspecified convulsions: Secondary | ICD-10-CM

## 2011-07-07 NOTE — Progress Notes (Signed)
  Subjective:    Patient ID: [name] [name], male    DOB: Jul 31, 1925, 78 y.o.   MRN: [id number]  HPI An 78 year old patient who is seen today in followup. He has multiple medical problems including COPD and coronary artery disease he is status post pacemaker insertion. He was evaluated in the ED yesterday by cardiology following a near syncopal episode. For the past 24 hours she has done quite well. He also has a history of dementia. He has treated hypertension and dyslipidemia ED records reviewed   Review of Systems  Constitutional: Negative for fever, chills, appetite change and fatigue.  HENT: Negative for hearing loss, ear pain, congestion, sore throat, trouble swallowing, neck stiffness, dental problem, voice change and tinnitus.   Eyes: Negative for pain, discharge and visual disturbance.  Respiratory: Negative for cough, chest tightness, wheezing and stridor.   Cardiovascular: Negative for chest pain, palpitations and leg swelling.  Gastrointestinal: Negative for nausea, vomiting, abdominal pain, diarrhea, constipation, blood in stool and abdominal distention.  Genitourinary: Negative for urgency, hematuria, flank pain, discharge, difficulty urinating and genital sores.  Musculoskeletal: Negative for myalgias, back pain, joint swelling, arthralgias and gait problem.  Skin: Negative for rash.  Neurological: Positive for light-headedness. Negative for dizziness, syncope, speech difficulty, weakness, numbness and headaches.  Hematological: Negative for adenopathy. Does not bruise/bleed easily.  Psychiatric/Behavioral: Positive for confusion. Negative for behavioral problems and dysphoric mood. The patient is not nervous/anxious.        Objective:   Physical Exam  Constitutional: He is oriented to person, place, and time. He appears well-developed.       Blood pressure 104/78  HENT:  Head: Normocephalic.  Right Ear: External ear normal.  Left Ear: External ear normal.  Eyes:  Conjunctivae and EOM are normal.  Neck: Normal range of motion.       Markedly diminished left carotid upstroke  Cardiovascular: Normal rate and normal heart sounds.   Pulmonary/Chest: Breath sounds normal.       O2 saturation 96  Abdominal: Bowel sounds are normal.  Musculoskeletal: Normal range of motion. He exhibits no edema and no tenderness.  Neurological: He is alert and oriented to person, place, and time.  Psychiatric: He has a normal mood and affect. His behavior is normal.          Assessment & Plan:   Episode of near-syncope  Coronary artery disease Hypertension stable Dyslipidemia History of DVT  No change in therapy. Followup cardiology and pulmonary Recheck here 4 months

## 2011-07-07 NOTE — Patient Instructions (Signed)
Limit your sodium (Salt) intake Call or return to clinic prn if these symptoms worsen or fail to improve as anticipated.   Return in 4 months for follow-up

## 2011-07-13 ENCOUNTER — Telehealth: Payer: Self-pay | Admitting: Internal Medicine

## 2011-07-13 ENCOUNTER — Ambulatory Visit (INDEPENDENT_AMBULATORY_CARE_PROVIDER_SITE_OTHER): Payer: Medicare Other | Admitting: Internal Medicine

## 2011-07-13 ENCOUNTER — Encounter: Payer: Self-pay | Admitting: Internal Medicine

## 2011-07-13 VITALS — BP 122/72 | HR 72 | Temp 98.0°F | Ht 72.0 in | Wt 189.2 lb

## 2011-07-13 DIAGNOSIS — J449 Chronic obstructive pulmonary disease, unspecified: Secondary | ICD-10-CM

## 2011-07-13 DIAGNOSIS — R0602 Shortness of breath: Secondary | ICD-10-CM

## 2011-07-13 MED ORDER — TIOTROPIUM BROMIDE MONOHYDRATE 18 MCG IN CAPS: 18.0000 ug | ORAL_CAPSULE | Freq: Every day | RESPIRATORY_TRACT | Status: DC

## 2011-07-13 MED ORDER — FLUTICASONE-SALMETEROL 250-50 MCG/DOSE IN AEPB: 1.0000 | INHALATION_SPRAY | Freq: Two times a day (BID) | RESPIRATORY_TRACT | Status: DC

## 2011-07-13 NOTE — Telephone Encounter (Signed)
[name] [name]  He has golds stage 2 copd. I have added advair to his spiriva today and will see how he responds to that. Given dementia (mild) hard to assess subjective response though wife thinks he is some better.   HE refuses to attend rehab  - anyway you can convince him ?  Also, need to know how is pacer is responding to exercise/exertion ? If unable to keep up with demand this could in part be responsible for dyspnea as you know  Thanks  MR

## 2011-07-13 NOTE — Progress Notes (Signed)
Subjective:    Patient ID: [name] [name], male    DOB: 1926-01-19, 78 y.o.   MRN: [id number]  HPI IOV 06/01/2011  78 year old male. Body mass index is 25.09 kg/(m^2).  reports that he quit smoking about 20 years ago. His smoking use included Cigarettes and Cigars. He has a 71 pack-year smoking history. He has never used smokeless tobacco. Referred by Dr Alyson Ingles of cardiology for dyspnea. Accompanied by wife. PEr patient he is aging and really sees no problems beyond normal aging. He is not interested in much interventional RXWife more concerned. Appears he has insidious onset, gradually progressive dyspnea on exertion associated with fatigue. Worse since insertion of pacemaker in summer 2012 for syncope (Dr Graciela Husbands, Sick sinus presumably). Wife notes he is grunting with wheeze during exertional dyspnea. Wife notices he is unable to keep up with her in [employer]. He takes his time to go to mailbox to avoid dyspnea. He gets dyspneic walking from car to MD office. Denies orthopnea, paroxysmal nocturnal dyspnea, edema, associated chest pain or cough or weight loss or hemoptysis. OF note, does not carry diagnosis of copd or fibrosis and is not on MDI Rx.   CXR Nov 2012 - hyperinflated c/w copd Walking desaturation test 06/01/2011 185 feeet x 3 laps: did not desaturate  Note: he is not interested interventional Rx. His wife is more concerned than him. He seems content with his life.  Refused rehab but agreed to mdi  REC I think you have some copd from prior smoking  Please start spiriva 1 puff daily - take sample, script and show technique  Return in 1 month to report progress  AT followup will do spirometry test  Come sooner if there are problems  OV 07/13/2011 Followup dyspnea after empiric rX with spiriva for presumed COPD (Other ddx for dyspnea: deconditioning and los of height and pacer). Note: he refused pulm rehab  Wife and he present: Wife feels dyspnea somewhat better but still room to improve.  He is not sure spiriva is helping. Both say they feel he could be better but he does not want to go to rehab. Spirometry today fev1 2L/60%, Ratio 63 c/w Gold stage 2 copd . Says pacer checked recently and cleared for 12 years. No interim complaints. They are willing to try sample advair to see if this could help dyspnea and lung function  Past, Family, Social reviewed: no change since last visit except as noted  I reviewed Dr Fredric Dine note from march 2013    Review of Systems  Constitutional: Negative for fever and unexpected weight change.  HENT: Negative for ear pain, nosebleeds, congestion, sore throat, rhinorrhea, sneezing, trouble swallowing, dental problem, postnasal drip and sinus pressure.   Eyes: Negative for redness and itching.  Respiratory: Negative for cough, chest tightness, shortness of breath and wheezing.   Cardiovascular: Negative for palpitations and leg swelling.  Gastrointestinal: Negative for nausea and vomiting.  Genitourinary: Negative for dysuria.  Musculoskeletal: Negative for joint swelling.  Skin: Negative for rash.  Neurological: Negative for headaches.  Hematological: Does not bruise/bleed easily.  Psychiatric/Behavioral: Negative for dysphoric mood. The patient is not nervous/anxious.        Objective:   Physical Exam  Nursing note and vitals reviewed. Constitutional: He is oriented to person, place, and time. He appears well-developed and well-nourished. No distress.       Looks stated age  HENT:  Head: Normocephalic and atraumatic.  Right Ear: External ear normal.  Left Ear:  External ear normal.  Mouth/Throat: Oropharynx is clear and moist. No oropharyngeal exudate.  Eyes: Conjunctivae and EOM are normal. Pupils are equal, round, and reactive to light. Right eye exhibits no discharge. Left eye exhibits no discharge. No scleral icterus.  Neck: Normal range of motion. Neck supple. No JVD present. No tracheal deviation present. No thyromegaly present.    Cardiovascular: Normal rate, regular rhythm and intact distal pulses.  Exam reveals no gallop and no friction rub.   No murmur heard. Pulmonary/Chest: Effort normal and breath sounds normal. No respiratory distress. He has no wheezes. He has no rales. He exhibits no tenderness.       Mild kyphosis +  Abdominal: Soft. Bowel sounds are normal. He exhibits no distension and no mass. There is no tenderness. There is no rebound and no guarding.  Musculoskeletal: Normal range of motion. He exhibits no edema and no tenderness.  Lymphadenopathy:    He has no cervical adenopathy.  Neurological: He is alert and oriented to person, place, and time. He has normal reflexes. No cranial nerve deficit. Coordination normal.       ? Mild dementia.  Skin: Skin is warm and dry. No rash noted. He is not diaphoretic. No erythema. No pallor.  Psychiatric: He has a normal mood and affect. His behavior is normal. Judgment and thought content normal.          Assessment & Plan:

## 2011-07-13 NOTE — Patient Instructions (Signed)
Your breathing difficulty in part is from moderate copd Please continue spiriva 1 puff daily - nurse will see if technique is good Please start advair 1 puff twice daily (medium dose strength)  - nurse will give 6 week sample, script and test technique Call back in 1 months to report progress REturn in 3 months for followup with spirometry at followup

## 2011-07-13 NOTE — Assessment & Plan Note (Signed)
Dyspnea in part is from moderate copd but deconditioning likely playing a role as well. I am unsure if there  Is inadequate pacer  heart rate response to exercise and if this is a contributory factor. He absolutely refuses to attend rehab. He and wife after counseling are willing to add advair to spiriva. I will see him at followup for response. I will contact Dr Clifton James about his pacer performance  > 50% of this > 25 min visit spent in face to face counseling (15 min visit converted to 25 min)

## 2011-07-14 NOTE — Telephone Encounter (Signed)
Murali, I can try to convince him to go to rehab. In regards to his pacemaker, I will defer to Berton Mount who follows that. I will forward to him for review.   Thanks,  Thayer Ohm

## 2011-07-15 ENCOUNTER — Telehealth: Payer: Self-pay | Admitting: Internal Medicine

## 2011-07-15 NOTE — Telephone Encounter (Signed)
Spoke with wife - doing ok now - outside , breathing normal for him , no c/o pain . BP this AM 107/66. Discussed s/s to call 911 - if breathing on , no pain , but BP shoots up again tonight - will see tomorrow AM. Call if any questions, to Dr. Amador Cunas to inform

## 2011-07-15 NOTE — Telephone Encounter (Signed)
Pts wife called and said that pt had heavy sweating and high bp 200/127 at 7pm last night. Pts spouse said that the bp decreased slowly and at 7:30pm pts bp was 169/93. Pt has already been to the ER for this in times past. Need to know what pt should do. Right now, pt seems to be doing ok and pts bp was normal this morning.

## 2011-07-16 ENCOUNTER — Telehealth: Payer: Self-pay | Admitting: Internal Medicine

## 2011-07-16 NOTE — Telephone Encounter (Signed)
Spoke with wife - pt is stable , no other sx - BP good at this time . I told her to continue to watch - discussed s/s to call 911 over the wkend if changes. Wife feels comfortable watching - stable.

## 2011-07-16 NOTE — Telephone Encounter (Signed)
Pt wife wanted to update on condition of her husband pt has had no more sweating spells yesterday but BP was 156/100 and pulse was 78 around 6:00 last night. This she re checked BP and it was 115/75 and pulse was 76. Please contact pt

## 2011-07-26 NOTE — Telephone Encounter (Signed)
Heart rate seemed to be appropriate for acdtivitry qwhen evaluated in  the fall

## 2011-07-28 ENCOUNTER — Ambulatory Visit (INDEPENDENT_AMBULATORY_CARE_PROVIDER_SITE_OTHER): Payer: Medicare Other | Admitting: *Deleted

## 2011-07-28 DIAGNOSIS — I6529 Occlusion and stenosis of unspecified carotid artery: Secondary | ICD-10-CM

## 2011-07-28 DIAGNOSIS — Z48812 Encounter for surgical aftercare following surgery on the circulatory system: Secondary | ICD-10-CM

## 2011-08-06 NOTE — Procedures (Unsigned)
CAROTID DUPLEX EXAM  INDICATION:  Right CEA.  HISTORY: Diabetes:  No. Cardiac:  Yes. Hypertension:  Yes. Smoking:  Yes. Previous Surgery:  Right CEA, 08/09/2006. CV History: Amaurosis Fugax No, Paresthesias No, Hemiparesis No.                                      RIGHT             LEFT Brachial systolic pressure:         140               140 Brachial Doppler waveforms:         WNL               WNL Vertebral direction of flow:        Antegrade         Antegrade DUPLEX VELOCITIES (cm/sec) CCA peak systolic                   89                68 ECA peak systolic                   114               108 ICA peak systolic                   65                54 ICA end diastolic                   19                14 PLAQUE MORPHOLOGY:                  Soft              Heterogenous PLAQUE AMOUNT:                      Mild              Mild PLAQUE LOCATION:                    CEA               CCA/ECA/ICA  IMPRESSION: 1. Patent right carotid endarterectomy with mild soft plaque in the     surgical bulb. 2. 1% to 39% left internal carotid artery plaquing. 3. No significant change since previous examination last year.  ___________________________________________ Larina Earthly, M.D.  LT/MEDQ  D:  07/28/2011  T:  07/28/2011  Job:  [id number]

## 2011-08-13 ENCOUNTER — Telehealth: Payer: Self-pay | Admitting: Internal Medicine

## 2011-08-13 NOTE — Telephone Encounter (Signed)
Spoke with pt's spouse. She is calling per MR request to give update on how pt's breathing is doing since he started on spirva and advair. She states that she can tell that his breathing is better, but he can not. She notices that he does not breathe a hard. Pt can not tell a difference in his breathing however. Will forward to MR so that he is aware.

## 2011-08-13 NOTE — Telephone Encounter (Signed)
Pt has appt in July with MR.  Attempted to call home number to make wife aware but the line is busy.  Will try back later.

## 2011-08-13 NOTE — Telephone Encounter (Signed)
Ok thanks. Pleae let the wife know I willl see him in 3 months from 07/13/11 visit. She should try to encourage him to attend rehab  For my perusal below:  I spoke to cards - Dr Graciela Husbands thinks pacer ok,   Basically he needs to attend rehab but he wont unless the wife can convince him.

## 2011-08-16 NOTE — Telephone Encounter (Signed)
Spoke with Irving Burton and notified of recs per MR. She verbalized understanding. States that pt will keep July appt and she will try her best to convince him to go to rehab.

## 2011-08-30 ENCOUNTER — Emergency Department (HOSPITAL_COMMUNITY)
Admission: EM | Admit: 2011-08-30 | Discharge: 2011-08-30 | Disposition: A | Discharge status: Home / Self Care | Payer: Medicare Other | Attending: Emergency Medicine | Admitting: Emergency Medicine

## 2011-08-30 ENCOUNTER — Telehealth: Payer: Self-pay | Admitting: Internal Medicine

## 2011-08-30 ENCOUNTER — Encounter (HOSPITAL_COMMUNITY): Payer: Self-pay | Admitting: Physical Medicine and Rehabilitation

## 2011-08-30 DIAGNOSIS — M129 Arthropathy, unspecified: Secondary | ICD-10-CM | POA: Insufficient documentation

## 2011-08-30 DIAGNOSIS — F17200 Nicotine dependence, unspecified, uncomplicated: Secondary | ICD-10-CM | POA: Insufficient documentation

## 2011-08-30 DIAGNOSIS — J4489 Other specified chronic obstructive pulmonary disease: Secondary | ICD-10-CM | POA: Insufficient documentation

## 2011-08-30 DIAGNOSIS — R079 Chest pain, unspecified: Secondary | ICD-10-CM | POA: Insufficient documentation

## 2011-08-30 DIAGNOSIS — E785 Hyperlipidemia, unspecified: Secondary | ICD-10-CM | POA: Insufficient documentation

## 2011-08-30 DIAGNOSIS — R61 Generalized hyperhidrosis: Secondary | ICD-10-CM

## 2011-08-30 DIAGNOSIS — J449 Chronic obstructive pulmonary disease, unspecified: Secondary | ICD-10-CM | POA: Insufficient documentation

## 2011-08-30 DIAGNOSIS — K219 Gastro-esophageal reflux disease without esophagitis: Secondary | ICD-10-CM | POA: Insufficient documentation

## 2011-08-30 DIAGNOSIS — I10 Essential (primary) hypertension: Secondary | ICD-10-CM | POA: Insufficient documentation

## 2011-08-30 DIAGNOSIS — Z45018 Encounter for adjustment and management of other part of cardiac pacemaker: Secondary | ICD-10-CM | POA: Insufficient documentation

## 2011-08-30 DIAGNOSIS — F0390 Unspecified dementia without behavioral disturbance: Secondary | ICD-10-CM | POA: Insufficient documentation

## 2011-08-30 DIAGNOSIS — R5381 Other malaise: Secondary | ICD-10-CM | POA: Insufficient documentation

## 2011-08-30 LAB — URINALYSIS, ROUTINE W REFLEX MICROSCOPIC
Bilirubin Urine: NEGATIVE
Glucose, UA: NEGATIVE mg/dL
Nitrite: NEGATIVE
Specific Gravity, Urine: 1.018 (ref 1.005–1.030)
pH: 7 (ref 5.0–8.0)

## 2011-08-30 LAB — POCT I-STAT, CHEM 8
BUN: 15 mg/dL (ref 6–23)
Chloride: 105 mEq/L (ref 96–112)
Glucose, Bld: 107 mg/dL — ABNORMAL HIGH (ref 70–99)
HCT: 36 % — ABNORMAL LOW (ref 39.0–52.0)
Potassium: 4.3 mEq/L (ref 3.5–5.1)

## 2011-08-30 LAB — CBC
HCT: 35.9 % — ABNORMAL LOW (ref 39.0–52.0)
Hemoglobin: 12 g/dL — ABNORMAL LOW (ref 13.0–17.0)
MCH: 30.5 pg (ref 26.0–34.0)
MCV: 91.1 fL (ref 78.0–100.0)
Platelets: 150 10*3/uL (ref 150–400)
RBC: 3.94 MIL/uL — ABNORMAL LOW (ref 4.22–5.81)
WBC: 5 10*3/uL (ref 4.0–10.5)

## 2011-08-30 LAB — URINE MICROSCOPIC-ADD ON

## 2011-08-30 LAB — POCT I-STAT TROPONIN I: Troponin i, poc: 0 ng/mL (ref 0.00–0.08)

## 2011-08-30 LAB — OCCULT BLOOD, POC DEVICE: Fecal Occult Bld: NEGATIVE

## 2011-08-30 NOTE — ED Notes (Addendum)
Pt presents to department via [facility] for evaluation of diffuse chest pain and generalized body aches. Pt states he began having chest pain this morning. Also states pain all over body and generalized weakness. Pt states "I just don't feel well." denies chest pain upon arrival to ED. He is alert and able to answer questions appropriately, however is very vague with symptoms. No signs of distress noted. Pt does have a pacemaker.

## 2011-08-30 NOTE — Telephone Encounter (Signed)
Caller: Emily/Spouse; PCP: Eleonore Chiquito; CB#: [phone]; ; ; Call regarding Elevated B/P 180/99 P 85;  Pt complains of SOB w/ sweating.  Pt denies CP.  Pt has HX of Stents and Pacemaker. Consulted w/ Office Kim, RN d/t ED dispo, Wife states Pt's appears "he will pass out", Pt has diaphorsis and elevated BP and SOB, RN agreed w/ dispo, advised WIfe to call 911.  Wife verbalized understanding.

## 2011-08-30 NOTE — ED Notes (Signed)
Pt states he does not know why he is at the hospital. Denies pain at the time. States he feels fine. Pt is alert and oriented to self, but confused about place, time and situation. Skin warm and dry. Respirations unlabored. No signs of distress at the time.

## 2011-08-30 NOTE — ED Notes (Addendum)
Wife at the bedside. States that pt became diaphoretic this morning, also states his BP was elevated this morning (180/99) and c/o pain all over body.

## 2011-08-30 NOTE — ED Provider Notes (Signed)
Medical screening examination/treatment/procedure(s) were conducted as a shared visit with non-physician practitioner(s) and myself.  I personally evaluated the patient during the encounter  Doug Sou, MD 08/30/11 1724

## 2011-08-30 NOTE — ED Notes (Signed)
Attempted to interrogate Pacemaker with charge nurse. Unable to called [employer] to interrogate pacemaker.

## 2011-08-30 NOTE — Discharge Instructions (Signed)
All of your bloodwork looked good today. Your EKG does not have any worrisome changes today, and your pacemaker appears to be working well. Please return to the emergency department with complaint of chest pain, shortness of breath, nausea/vomiting, or any other worrisome symptoms. Follow up with your cardiologists.  Diaphoresis Sweating is controlled by our nervous system. Sweat glands are found in the skin throughout the body. They exist in higher numbers in the skin of the hands, feet, armpits, and the genital region. Sweating occurs normally when the temperature of your body goes up. Diaphoresis means profuse sweating or perspiration due to an underlying medical condition or an external factor (such as medicines). Hyperhidrosis means excessive sweating that is not usually due to an underlying medical condition, on areas such as the palms, soles, or armpits. Other areas of the body may also be affected. Hyperhidrosis usually begins in childhood or early adolescence. It increases in severity through puberty and into adulthood. Sweaty palms are the most common problem and the most bothersome to people who have hyperhidrosis. CAUSES  Sweating is normally seen with exercise or being in hot surroundings. Not sweating in these conditions would be harmful. Stressful situations can also cause sweating. In some people, stimulation of the sweat glands during stress is overactive. Talking to strangers or shaking someone's hand can produce profuse sweating. Causes of sweating include:  Emotional upset.   Low blood pressure.   Low blood sugar.   Heart problems.   Low blood cell counts.   Certain pain relieving medicines.   Exercise.   Alcohol.   Infection.   Caffeine.   Spicy foods.   Hot flashes.   Overactive thyroid.   Illegal drug use, such as cocaine and amphetamine.   Use of medicines that stimulate parts of the nervous system.   A tumor (pheochromocytoma).   Withdrawal from some  medicines or alcohol.  DIAGNOSIS  Your caregiver needs to be consulted to make sure excessive sweating is not caused by another condition. Further testing may need to be done. TREATMENT   When hyperhidrosis is caused by another condition, that condition should be treated.   If menopause is the cause, you may wish to talk to your caregiver about estrogen replacement.   If the hyperhidrosis is a natural happening of the way your body works, certain antiperspirants may help.   If medicines do not work, injections of botulinum toxin type A are sometimes used for underarm sweating.   Your caregiver can usually help you with this problem.  Document Released: 09/21/2004 Document Revised: 02/18/2011 Document Reviewed: 04/08/2008 [facility] Patient Information 2012 [place], [place].

## 2011-08-30 NOTE — ED Notes (Signed)
Patient's pacemaker is [employer] called and will arrive to ED shorlty.

## 2011-08-30 NOTE — ED Provider Notes (Signed)
History     CSN: [id number]  Arrival date & time 08/30/11  4098   First MD Initiated Contact with Patient 08/30/11 1057      Chief Complaint  Patient presents with  . Chest Pain  . Weakness    (Consider location/radiation/quality/duration/timing/severity/associated sxs/prior treatment) Patient is a 78 y.o. male presenting with chest pain and weakness.  Chest Pain  Associated symptoms include weakness.    Weakness  Additional symptoms include weakness.   Level V caveat, dementia history is obtained from patient's wife. Patient complained of "pain all over"and generalized weakness at 8:30 AM today. Wife reports that he was drained since last however he was outside in a very humid environment at the time. He is presently asymptomatic. Denies pain anywhere. No treatment prior to coming here. Past Medical History  Diagnosis Date  . Pacemaker 08/09/2006    family denies this history  . Complete heart block 11/21/2008    intermittent  . BENIGN PROSTATIC HYPERTROPHY 08/09/2006  . CEREBROVASCULAR ACCIDENT, HX OF 08/09/2006  . COLONIC POLYPS, HX OF 08/09/2006  . COPD 01/13/2009  . CORONARY ARTERY DISEASE 01/12/2008    Drug eluting stent Circumflex 2009;  LHC 02/08/11:  CFX stent patent, LVF normal, left ventricle "spade-shaped" consistent with hypertensive heart disease  . GERD 08/09/2006  . HYPERLIPIDEMIA 08/09/2006  . HYPERTENSION, BENIGN 08/13/2008  . MELENA, HX OF 02/01/2008  . PHLEBITIS, LOWER EXTREMITY 06/08/2007  . Polymyalgia rheumatica 08/09/2006  . SYNCOPE 12/25/2008    Secondary heart block  . VENTRICULAR TACHYCARDIA 01/12/2008    Nonsustained  . WEAKNESS 11/21/2008  . Complication of anesthesia 02/08/11    "? 1966; I think he just quit breathing after Demerol"  . Stroke   . Pneumonia 02/08/11    "walking pneumonia; several years ago"  . Whooping cough     "as a child"  . Cancer   . Arthritis   . DEMENTIA     Past Surgical History  Procedure Date  . Lumbar  laminectomy   . Transurethral resection of prostate   . Coronary angioplasty with stent placement   . Cataract extraction w/ intraocular lens  implant, bilateral ~ 2000  . Back surgery   . Insert / replace / remove pacemaker 09/07/10    initial placement  . Carotid endarterectomy     left    Family History  Problem Relation Age of Onset  . Heart disease Father     History  Substance Use Topics  . Smoking status: Former Smoker -- 1.0 packs/day for 71 years    Types: Cigarettes, Cigars    Quit date: 03/16/1991  . Smokeless tobacco: Never Used   Comment: Pt has not had a cigar in a good while 3-5 2013  . Alcohol Use: Yes     "used to have glass of wine or beer occasionally; hasn't had one since ~ 2002"      Review of Systems  HENT: Negative.   Respiratory: Negative.   Gastrointestinal: Negative.   Musculoskeletal: Negative.   Skin: Negative.   Neurological: Positive for weakness.  Hematological: Negative.   Psychiatric/Behavioral: Negative.     Allergies  Atorvastatin; Demerol; and Meperidine hcl  Home Medications   Current Outpatient Rx  Name Route Sig Dispense Refill  . ASPIRIN EC 81 MG PO TBEC Oral Take 81 mg by mouth daily.      Marland Kitchen CLONAZEPAM 0.5 MG PO TABS Oral Take 0.25 mg by mouth daily.      Marland Kitchen CLOPIDOGREL BISULFATE 75  MG PO TABS Oral Take 75 mg by mouth daily.      Marland Kitchen DIVALPROEX SODIUM 250 MG PO TBEC Oral Take 250 mg by mouth daily.      Marland Kitchen FLUTICASONE-SALMETEROL 250-50 MCG/DOSE IN AEPB Inhalation Inhale 1 puff into the lungs 2 (two) times daily. 60 each 6  . LABETALOL HCL 100 MG PO TABS Oral Take 100-200 mg by mouth 2 (two) times daily. Take 1 and 1/2 (half) tablet in the morning to = 150 mg ; take 1 tablet in the evening to = 100 mg    . PRAVASTATIN SODIUM 40 MG PO TABS Oral Take 40 mg by mouth at bedtime.     Marland Kitchen RANITIDINE HCL 150 MG PO TABS Oral Take 150 mg by mouth at bedtime.      Marland Kitchen RIVASTIGMINE 4.6 MG/24HR TD PT24 Transdermal Place 1 patch onto the skin  daily.    Marland Kitchen TIOTROPIUM BROMIDE MONOHYDRATE 18 MCG IN CAPS Inhalation Place 1 capsule (18 mcg total) into inhaler and inhale daily. 30 capsule 6    BP 119/62  Pulse 62  Temp 97.7 F (36.5 C) (Oral)  Resp 18  SpO2 99%  Physical Exam  Nursing note and vitals reviewed. Constitutional: He appears well-developed and well-nourished.  HENT:  Head: Normocephalic and atraumatic.  Eyes: Conjunctivae are normal. Pupils are equal, round, and reactive to light.  Neck: Neck supple. No tracheal deviation present. No thyromegaly present.  Cardiovascular: Normal rate and regular rhythm.   No murmur heard. Pulmonary/Chest: Effort normal and breath sounds normal.  Abdominal: Soft. Bowel sounds are normal. He exhibits no distension. There is no tenderness.  Genitourinary: Rectum normal and prostate normal. Guaiac negative stool.  Musculoskeletal: Normal range of motion. He exhibits no edema and no tenderness.  Neurological: He is alert. Coordination normal.  Skin: Skin is warm and dry. No rash noted.  Psychiatric: He has a normal mood and affect.    ED Course  Procedures (including critical care time)  Date: 08/30/2011  Rate: 65  Rhythm: normal sinus rhythm  QRS Axis: left  Intervals: normal  ST/T Wave abnormalities: normal  Conduction Disutrbances:none  Narrative Interpretation:   Old EKG Reviewed: Unchanged from 07/06/2011   Labs Reviewed  CBC   No results found.   No diagnosis found.    MDM  To rule out acute coronary syndrome, serial cardiac markers. Also will test pacemaker Diagnosis weakness        Doug Sou, MD 08/30/11 1724

## 2011-08-30 NOTE — ED Provider Notes (Signed)
11:47 AM Care assumed of pt in the CDU from Dr. Ethelda Chick. Pt with hx CAD, pacemaker presents with an episode of diaphoresis this morning per family. He has severe dementia, so it is unclear if he had associated CP/SOB with this. ECG c/w previous. Reported dark stools - stools guaiac negative.  Primary Physician: Rogelia Boga, MD, MD  Primary Cardiologist: Dr. McAlhany/Dr. Graciela Husbands [facility])  Plan: -interrogate pacemaker -2 sets of cardiac markers  1:32 PM Pt without complaints at this time other than stating that he's hungry. Exam reassuring. Initial troponin negative. Second troponin due at 3 pm.  4:08 PM Second troponin 0.00. Pt has remained without complaint since arrival in CDU. I spoke with [employer] rep about device interrogation - he states this showed rare atrial pacing and a few runs of 5-6 beats of atrial tachycardia, which is not significant. No arrhythmias. The rep did discuss the findings with Dr. Graciela Husbands, pt's EP cardiologist. Based on this, feel the pt is stable to be discharged home. Reasons to return discussed. Findings d/w pt and wife who are agreeable.  Grant Fontana, PA-C 08/30/11 1610

## 2011-08-31 ENCOUNTER — Telehealth: Payer: Self-pay | Admitting: Cardiovascular Disease

## 2011-08-31 ENCOUNTER — Telehealth: Payer: Self-pay | Admitting: Internal Medicine

## 2011-08-31 NOTE — Telephone Encounter (Signed)
Please advise/call

## 2011-08-31 NOTE — Telephone Encounter (Signed)
Pts spouse called and said that pt went to ER yesterday re: excessive persiration. Test were done, but everything came back normal. Wondering if this could be side effect to pts meds? Pls call.

## 2011-08-31 NOTE — Telephone Encounter (Signed)
I spoke with pt's wife who reports she was told she should check with pt's doctor about possibility of meds causing excessive perspiration. Pt was seen in ED for this yesterday.  ED note reviewed.  Wife not sure which meds in question but thinks it may be exelon patch. No recent change in heart medications.    I asked wife to contact primary MD to review all meds and see if he needs follow up post ED visit.  Wife agreeable with this plan. Wife states perspiration has improved and pt is feeling well today.

## 2011-08-31 NOTE — Telephone Encounter (Signed)
New msg Pt's wife called and said he has excessive perspiration. She said that they went to er yesterday and they suggested she call his cardiologist. She thinks it may be one of his meds  Please call

## 2011-08-31 NOTE — Telephone Encounter (Signed)
appt made per dr. Amador Cunas

## 2011-08-31 NOTE — Telephone Encounter (Signed)
Call been discussed. Patient will be seen at 3:15 Thursday afternoon

## 2011-09-02 ENCOUNTER — Ambulatory Visit (INDEPENDENT_AMBULATORY_CARE_PROVIDER_SITE_OTHER): Payer: Medicare Other | Admitting: Internal Medicine

## 2011-09-02 ENCOUNTER — Encounter: Payer: Self-pay | Admitting: Internal Medicine

## 2011-09-02 VITALS — BP 120/78 | Wt 194.0 lb

## 2011-09-02 DIAGNOSIS — R413 Other amnesia: Secondary | ICD-10-CM

## 2011-09-02 DIAGNOSIS — R569 Unspecified convulsions: Secondary | ICD-10-CM

## 2011-09-02 DIAGNOSIS — R5381 Other malaise: Secondary | ICD-10-CM

## 2011-09-02 DIAGNOSIS — IMO0001 Reserved for inherently not codable concepts without codable children: Secondary | ICD-10-CM

## 2011-09-02 DIAGNOSIS — I10 Essential (primary) hypertension: Secondary | ICD-10-CM

## 2011-09-02 DIAGNOSIS — R5383 Other fatigue: Secondary | ICD-10-CM

## 2011-09-02 MED ORDER — RIVASTIGMINE 4.6 MG/24HR TD PT24: 1.0000 | MEDICATED_PATCH | Freq: Every day | TRANSDERMAL | Status: DC

## 2011-09-02 NOTE — Progress Notes (Signed)
  Subjective:    Patient ID: [name] [name], male    DOB: 1925/09/08, 78 y.o.   MRN: [id number]  HPI   78 year old patient who is seen today following an ER visit.  At that time he was evaluated following an episode of profuse diaphoresis.  ED evaluation was noncontributory the patient has a history of dementia and most of the history is obtained from his wife. He is a number of episodes since November of last year including an episode 3 days ago and again yesterday.  Episodes are described as profuse diaphoresis associated with palor anxiety weakness and at times shortness of breath. Yesterday his wife took his blood pressure number of times with quite elevated blood pressure readings but normal pulse rate.  The patient is really able to provide no further history. No history of glucose intolerance    Review of Systems  Constitutional: Positive for diaphoresis and fatigue. Negative for fever, chills and appetite change.  HENT: Negative for hearing loss, ear pain, congestion, sore throat, trouble swallowing, neck stiffness, dental problem, voice change and tinnitus.   Eyes: Negative for pain, discharge and visual disturbance.  Respiratory: Positive for shortness of breath. Negative for cough, chest tightness, wheezing and stridor.   Cardiovascular: Negative for chest pain, palpitations and leg swelling.  Gastrointestinal: Negative for nausea, vomiting, abdominal pain, diarrhea, constipation, blood in stool and abdominal distention.  Genitourinary: Negative for urgency, hematuria, flank pain, discharge, difficulty urinating and genital sores.  Musculoskeletal: Negative for myalgias, back pain, joint swelling, arthralgias and gait problem.  Skin: Negative for rash.  Neurological: Positive for weakness. Negative for dizziness, syncope, speech difficulty, numbness and headaches.  Hematological: Negative for adenopathy. Does not bruise/bleed easily.  Psychiatric/Behavioral: Negative for behavioral  problems and dysphoric mood. The patient is not nervous/anxious.        Objective:   Physical Exam  Constitutional: He appears well-developed and well-nourished. No distress.  Cardiovascular: Normal rate, regular rhythm and normal heart sounds.   Pulmonary/Chest: Effort normal and breath sounds normal. No respiratory distress.          Assessment & Plan:   Recurrent episodes of diaphoresis associated with palor , hypertension weakness and shortness of breath ; patient has had 2 episodes this week including yesterday- might be a good time to screen for an unlikely pheochromocytoma. We'll check a plasma metanephrine We'll continue present regimen that includes twice daily Normodyne Cardiology followup as scheduled  Coronary artery disease stable Dementia unchanged continue Exelon

## 2011-09-02 NOTE — Patient Instructions (Signed)
Limit your sodium (Salt) intake  Followup cardiology  Continue present medications

## 2011-09-05 LAB — METANEPHRINES, PLASMA
Metanephrine, Free: 33 pg/mL (ref <=57)
Normetanephrine, Free: 91 pg/mL (ref <=148)

## 2011-09-09 ENCOUNTER — Telehealth: Payer: Self-pay | Admitting: Internal Medicine

## 2011-09-09 NOTE — Telephone Encounter (Signed)
Pts spouse called req to get lab results.

## 2011-09-10 NOTE — Telephone Encounter (Signed)
Patient is aware 

## 2011-09-10 NOTE — Telephone Encounter (Signed)
Please call/notify patient that lab/test/procedure is normal 

## 2011-09-20 ENCOUNTER — Encounter: Payer: Self-pay | Admitting: Internal Medicine

## 2011-09-20 ENCOUNTER — Ambulatory Visit (INDEPENDENT_AMBULATORY_CARE_PROVIDER_SITE_OTHER): Payer: Medicare Other | Admitting: Internal Medicine

## 2011-09-20 VITALS — BP 128/72 | HR 69 | Temp 97.8°F | Ht 72.0 in | Wt 191.4 lb

## 2011-09-20 DIAGNOSIS — J449 Chronic obstructive pulmonary disease, unspecified: Secondary | ICD-10-CM

## 2011-09-20 DIAGNOSIS — F17200 Nicotine dependence, unspecified, uncomplicated: Secondary | ICD-10-CM

## 2011-09-20 DIAGNOSIS — J4489 Other specified chronic obstructive pulmonary disease: Secondary | ICD-10-CM

## 2011-09-20 NOTE — Assessment & Plan Note (Signed)
Not sure he ever quit 20 years ago. He had a cig pack in his pocket. He is not interested in quitting and wont give me details

## 2011-09-20 NOTE — Progress Notes (Signed)
Subjective:    Patient ID: [name] [name], male    DOB: 1926-03-02, 78 y.o.   MRN: [id number]  HPI #[place]  - 71 pack smoker. REportedly quit 1992 but seen with cigarette pack in pocket 09/20/2011  #COPD  - CXR Nov 2012 - hyperinflated c/w copd  - Walking desaturation test 06/01/2011 185 feeet x 3 laps: did not desaturate  - Spirometry today fev1 2L/60%, Ratio 63 c/w Gold stage 2 copd   #Dyspnea  - Worse since insertion of pacemaker in summer 2012 for syncope (Dr Graciela Husbands, Sick sinus presumably).  - more of a complaint for wife than for him; class 2 ([employer], car to office) - dementia  - Pacer ok per Dr Graciela Husbands - May 2013 - refuses rehab - CAT score 01 October 2011 with spiriva and advair     OV 09/20/2011 Last visit 07/13/11. Followup coipd and multifactorial dyspnea. Here to report progress. Dr Graciela Husbands tells me his pacer is ok. WE are struggling to convince him about bnefit of rehab. Today he states is better. HE also states he never had a problem with dyspnea. He is not sure why he has to see me. Wife says that after spiriva and advair; dyspnea is likely better because he is out of the home more this summer but he says this is because it stopped snowing. He again sees no benefit from rehab. There are not new complaints. COPD CAT score 19 and reflects moderate disease burden.   CAT COPD Symptom and Quality of Life Score ([employer])  0 (no burden) to 5 (highest burden)  Never Cough -> Cough all the time 2  No phlegm in chest -> Chest is full of phlegm 3  No chest tightness -> Chest feels very tight 2  No dyspnea for 1 flight stairs/hill -> Very dyspneic for 1 flight of stairs 2  No limitations for ADL at home -> Very limited with ADL at home 4  Confident leaving home -> Not at all confident leaving home 1  Sleep soundly -> Do not sleep soundly because of lung condition 1  Lots of Energy -> No energy at all 4  TOTAL Score (max 40)  19    Past, Family, Social reviewed: no change  since last visit    Review of Systems  Constitutional: Negative for fever and unexpected weight change.  HENT: Negative for ear pain, nosebleeds, congestion, sore throat, rhinorrhea, sneezing, trouble swallowing, dental problem, postnasal drip and sinus pressure.   Eyes: Negative for redness and itching.  Respiratory: Negative for cough, chest tightness, shortness of breath and wheezing.   Cardiovascular: Negative for palpitations and leg swelling.  Gastrointestinal: Negative for nausea and vomiting.  Genitourinary: Negative for dysuria.  Musculoskeletal: Negative for joint swelling.  Skin: Negative for rash.  Neurological: Negative for headaches.  Hematological: Does not bruise/bleed easily.  Psychiatric/Behavioral: Negative for dysphoric mood. The patient is not nervous/anxious.        Objective:   Physical Exam Nursing note and vitals reviewed. Constitutional: He is oriented to person, place, and time. He appears well-developed and well-nourished. No distress.       Looks stated age  HENT:  Head: Normocephalic and atraumatic.  Right Ear: External ear normal.  Left Ear: External ear normal.  Mouth/Throat: Oropharynx is clear and moist. No oropharyngeal exudate.  Eyes: Conjunctivae and EOM are normal. Pupils are equal, round, and reactive to light. Right eye exhibits no discharge. Left eye exhibits no discharge. No scleral icterus.  Neck: Normal range of motion. Neck supple. No JVD present. No tracheal deviation present. No thyromegaly present.  Cardiovascular: Normal rate, regular rhythm and intact distal pulses.  Exam reveals no gallop and no friction rub.   No murmur heard. Pulmonary/Chest: Effort normal and breath sounds normal. No respiratory distress. He has no wheezes. He has no rales. He exhibits no tenderness.       Mild kyphosis +  Abdominal: Soft. Bowel sounds are normal. He exhibits no distension and no mass. There is no tenderness. There is no rebound and no  guarding.  Musculoskeletal: Normal range of motion. He exhibits no edema and no tenderness.  Lymphadenopathy:    He has no cervical adenopathy.  Neurological: He is alert and oriented to person, place, and time. He has normal reflexes. No cranial nerve deficit. Coordination normal.       ? Mild dementia.  Skin: Skin is warm and dry. No rash noted. He is not diaphoretic. No erythema. No pallor.  Psychiatric: He has a normal mood and affect. His behavior is normal. Judgment and thought content normal.         Assessment & Plan:

## 2011-09-20 NOTE — Patient Instructions (Addendum)
Glad you are doing well Continue spiriva and advair REturn in 1 year for followup Ensure flu shot in fall Call or come sooner if needed

## 2011-09-20 NOTE — Assessment & Plan Note (Signed)
Wife somewhat happier with his dyspnea but for him is not an issue. He is upset he is here seeing pulmonary. I counselled wife that he is on mximal med Rx and not hypoxemic and so further benefit will only be from rehab which he is not willing for. So, we have no choice but to accept current quality of life. She is ok with that. Advised to continue spiriva and advair for now.. COuld taper to spiriva alone in future. ROV 1 year    > 50% of this 15 min visit spent in face to face counseling

## 2011-09-21 ENCOUNTER — Encounter: Payer: Self-pay | Admitting: Internal Medicine

## 2011-09-21 ENCOUNTER — Ambulatory Visit (INDEPENDENT_AMBULATORY_CARE_PROVIDER_SITE_OTHER): Payer: Medicare Other | Admitting: Internal Medicine

## 2011-09-21 VITALS — BP 118/68 | HR 69 | Resp 18 | Ht 60.0 in | Wt 138.1 lb

## 2011-09-21 DIAGNOSIS — Z950 Presence of cardiac pacemaker: Secondary | ICD-10-CM

## 2011-09-21 DIAGNOSIS — I2510 Atherosclerotic heart disease of native coronary artery without angina pectoris: Secondary | ICD-10-CM

## 2011-09-21 DIAGNOSIS — I442 Atrioventricular block, complete: Secondary | ICD-10-CM | POA: Insufficient documentation

## 2011-09-21 DIAGNOSIS — I10 Essential (primary) hypertension: Secondary | ICD-10-CM

## 2011-09-21 DIAGNOSIS — R55 Syncope and collapse: Secondary | ICD-10-CM

## 2011-09-21 DIAGNOSIS — F17200 Nicotine dependence, unspecified, uncomplicated: Secondary | ICD-10-CM

## 2011-09-21 DIAGNOSIS — I472 Ventricular tachycardia: Secondary | ICD-10-CM

## 2011-09-21 LAB — PACEMAKER DEVICE OBSERVATION
AL AMPLITUDE: 2.7 mv
AL IMPEDENCE PM: 450 Ohm
ATRIAL PACING PM: 2.7
DEVICE MODEL PM: 7241194
RV LEAD IMPEDENCE PM: 562.5 Ohm
RV LEAD THRESHOLD: 0.75 V
VENTRICULAR PACING PM: 1

## 2011-09-21 NOTE — Assessment & Plan Note (Signed)
No recurrent syncope 

## 2011-09-21 NOTE — Assessment & Plan Note (Signed)
Not interested in quitting 

## 2011-09-21 NOTE — Assessment & Plan Note (Signed)
Without chest pain, stable continue current meds

## 2011-09-21 NOTE — Assessment & Plan Note (Signed)
The patient's device was interrogated.  The information was reviewed. No changes were made in the programming.    

## 2011-09-21 NOTE — Patient Instructions (Signed)

## 2011-09-21 NOTE — Assessment & Plan Note (Signed)
Well controlled 

## 2011-09-21 NOTE — Assessment & Plan Note (Signed)
Stable post pacing 

## 2011-09-21 NOTE — Progress Notes (Signed)
HPI  [name] [name] [name] is a 78 y.o. male Seen in followup for intermittent complete heart block for which she underwent pacing spring 2012.  [name] [name] [name] also has a history of coronary artery disease with prior circumflex stenting. Echo March 2013 demonstrated normal left ventricular function without significant valvular issues. Catheterization November 2012 demonstrated [name] circumflex otherwise normal vessels Myoview October 2011 demonstrated no ischemia [name] [name] [name] has a history of syncope apparently associated with ventricular tachycardia.  [name] [name] [name] denies chest pain, shortness of breath, nocturnal dyspnea, orthopnea or peripheral edema.  There have been no palpitations, lightheadedness or syncope.    Past Medical History  Diagnosis Date  . Pacemaker 08/09/2006    family denies this history  . Complete heart block 11/21/2008    intermittent  . BENIGN PROSTATIC HYPERTROPHY 08/09/2006  . CEREBROVASCULAR ACCIDENT, HX OF 08/09/2006  . COLONIC POLYPS, HX OF 08/09/2006  . COPD 01/13/2009  . CORONARY ARTERY DISEASE 01/12/2008    Drug eluting stent Circumflex 2009;  LHC 02/08/11:  CFX stent patent, LVF normal, left ventricle "spade-shaped" consistent with hypertensive heart disease  . GERD 08/09/2006  . HYPERLIPIDEMIA 08/09/2006  . HYPERTENSION, BENIGN 08/13/2008  . MELENA, HX OF 02/01/2008  . PHLEBITIS, LOWER EXTREMITY 06/08/2007  . Polymyalgia rheumatica 08/09/2006  . SYNCOPE 12/25/2008    Secondary heart block  . VENTRICULAR TACHYCARDIA 01/12/2008    Nonsustained  . WEAKNESS 11/21/2008  . Complication of anesthesia 02/08/11    "? 1966; I think [name] [name] [name] just quit breathing after Demerol"  . Stroke   . Pneumonia 02/08/11    "walking pneumonia; several years ago"  . Whooping cough     "as a child"  . Cancer   . Arthritis   . DEMENTIA     Past Surgical History  Procedure Date  . Lumbar laminectomy   . Transurethral resection of prostate   . Coronary angioplasty with stent placement   . Cataract extraction w/  intraocular lens  implant, bilateral ~ 2000  . Back surgery   . Insert / replace / remove pacemaker 09/07/10    initial placement  . Carotid endarterectomy     left    Current Outpatient Prescriptions  Medication Sig Dispense Refill  . aspirin EC 81 MG tablet Take 81 mg by mouth daily.        . clonazePAM (KLONOPIN) 0.5 MG tablet Take 0.25 mg by mouth every morning.       . clopidogrel (PLAVIX) 75 MG tablet Take 75 mg by mouth daily.        . divalproex (DEPAKOTE) 250 MG DR tablet Take 250 mg by mouth daily.       . Fluticasone-Salmeterol (ADVAIR DISKUS) 250-50 MCG/DOSE AEPB Inhale 1 puff into [name] lungs 2 (two) times daily.  60 each  6  . labetalol (NORMODYNE) 100 MG tablet Take 100-150 mg by mouth 2 (two) times daily. Takes 150 mg in [name] morning (1 and 1/2 tablets) and takes 100 mg (1 tablet) in [name] evening      . pravastatin (PRAVACHOL) 40 MG tablet Take 40 mg by mouth daily.       . ranitidine (ZANTAC) 150 MG tablet Take 150 mg by mouth at bedtime.        . rivastigmine (EXELON) 4.6 mg/24hr Place 1 patch (4.6 mg total) onto [name] skin daily.  30 patch  6  . tiotropium (SPIRIVA HANDIHALER) 18 MCG inhalation capsule Place 1 capsule (18 mcg total) into inhaler and inhale daily.  30 capsule  6  Allergies  Allergen Reactions  . Atorvastatin Other (See Comments)    Unknown.  . Demerol (Meperidine)   . Meperidine Hcl Other (See Comments)    Unknown [name] [name] wife states it "knocked him out" years ago    Review of Systems negative except from HPI and PMH  Physical Exam BP 118/68  Pulse 69  Resp 18  Ht 5' (1.524 m)  Wt 138 lb 1.9 oz (62.651 kg)  BMI 26.97 kg/m2  SpO2 98%  Well developed and well nourished in no acute distress HENT normal E scleral and icterus clear Neck Supple JVP flat; carotids brisk and full Clear to ausculation Regular rate and rhythm, no murmurs gallops or rub Soft with active bowel sounds No clubbing cyanosis none Edema Alert and oriented, grossly  normal motor and sensory function Skin Warm and Dry    Assessment and  Plan

## 2011-09-23 NOTE — Assessment & Plan Note (Signed)
No intercurrent Ventricular tachycardia  

## 2011-10-04 ENCOUNTER — Ambulatory Visit (INDEPENDENT_AMBULATORY_CARE_PROVIDER_SITE_OTHER): Payer: Medicare Other | Admitting: Internal Medicine

## 2011-10-04 ENCOUNTER — Encounter: Payer: Self-pay | Admitting: Internal Medicine

## 2011-10-04 VITALS — BP 90/68 | HR 72 | Wt 189.0 lb

## 2011-10-04 DIAGNOSIS — I10 Essential (primary) hypertension: Secondary | ICD-10-CM

## 2011-10-04 DIAGNOSIS — R413 Other amnesia: Secondary | ICD-10-CM

## 2011-10-04 DIAGNOSIS — E785 Hyperlipidemia, unspecified: Secondary | ICD-10-CM

## 2011-10-04 DIAGNOSIS — J449 Chronic obstructive pulmonary disease, unspecified: Secondary | ICD-10-CM

## 2011-10-04 DIAGNOSIS — I2510 Atherosclerotic heart disease of native coronary artery without angina pectoris: Secondary | ICD-10-CM

## 2011-10-04 NOTE — Progress Notes (Signed)
  Subjective:    Patient ID: [name] [name], male    DOB: 1925/07/20, 78 y.o.   MRN: [id number]  HPI  78 year old patient who is seen today in followup. He has multiple medical problems including cerebrovascular disease and coronary artery disease. He is treated hypertension and dyslipidemia. He has a history of ventricular tachycardia and syncope. He has cognitive impairment. For the past few months he is quite well. Still has some episodes of weakness and sweating. Blood pressure continues to be a bit labile.    Review of Systems  Constitutional: Negative for fever, chills, appetite change and fatigue.  HENT: Negative for hearing loss, ear pain, congestion, sore throat, trouble swallowing, neck stiffness, dental problem, voice change and tinnitus.   Eyes: Negative for pain, discharge and visual disturbance.  Respiratory: Negative for cough, chest tightness, wheezing and stridor.   Cardiovascular: Negative for chest pain, palpitations and leg swelling.  Gastrointestinal: Negative for nausea, vomiting, abdominal pain, diarrhea, constipation, blood in stool and abdominal distention.  Genitourinary: Negative for urgency, hematuria, flank pain, discharge, difficulty urinating and genital sores.  Musculoskeletal: Negative for myalgias, back pain, joint swelling, arthralgias and gait problem.  Skin: Negative for rash.  Neurological: Positive for weakness and light-headedness. Negative for dizziness, syncope, speech difficulty, numbness and headaches.  Hematological: Negative for adenopathy. Does not bruise/bleed easily.  Psychiatric/Behavioral: Negative for behavioral problems and dysphoric mood. The patient is not nervous/anxious.        Objective:   Physical Exam  Constitutional: He is oriented to person, place, and time. He appears well-developed.       Blood pressure 110/70  HENT:  Head: Normocephalic.  Right Ear: External ear normal.  Left Ear: External ear normal.  Eyes: Conjunctivae and  EOM are normal.  Neck: Normal range of motion.  Cardiovascular: Normal rate and normal heart sounds.   Pulmonary/Chest: Breath sounds normal.  Abdominal: Bowel sounds are normal.  Musculoskeletal: Normal range of motion. He exhibits no edema and no tenderness.  Neurological: He is alert and oriented to person, place, and time.  Psychiatric: He has a normal mood and affect. His behavior is normal.          Assessment & Plan:   Moderate dementia stable. Continue Exelon patch Hypertension controlled Coronary artery disease stable Continue aspirin beta blocker therapy and statin therapy. COPD. Continue inhalational medications  Recheck 4 months

## 2011-10-20 ENCOUNTER — Other Ambulatory Visit: Payer: Self-pay | Admitting: Internal Medicine

## 2011-10-21 ENCOUNTER — Other Ambulatory Visit: Payer: Self-pay | Admitting: Internal Medicine

## 2011-10-26 ENCOUNTER — Other Ambulatory Visit: Payer: Self-pay | Admitting: Internal Medicine

## 2011-11-11 ENCOUNTER — Other Ambulatory Visit: Payer: Self-pay

## 2011-11-11 ENCOUNTER — Other Ambulatory Visit: Payer: Self-pay | Admitting: Internal Medicine

## 2011-11-11 MED ORDER — CLOPIDOGREL BISULFATE 75 MG PO TABS: 75.0000 mg | ORAL_TABLET | Freq: Every day | ORAL | Status: DC

## 2011-11-16 ENCOUNTER — Encounter: Payer: Self-pay | Admitting: Internal Medicine

## 2011-11-25 ENCOUNTER — Ambulatory Visit (INDEPENDENT_AMBULATORY_CARE_PROVIDER_SITE_OTHER): Payer: Medicare Other | Admitting: Cardiovascular Disease

## 2011-11-25 ENCOUNTER — Encounter: Payer: Self-pay | Admitting: Cardiovascular Disease

## 2011-11-25 VITALS — BP 101/60 | HR 64 | Ht 72.0 in | Wt 183.0 lb

## 2011-11-25 DIAGNOSIS — I25810 Atherosclerosis of coronary artery bypass graft(s) without angina pectoris: Secondary | ICD-10-CM

## 2011-11-25 DIAGNOSIS — I442 Atrioventricular block, complete: Secondary | ICD-10-CM

## 2011-11-25 NOTE — Patient Instructions (Addendum)
Your physician wants you to follow-up in:  12 months.  You will receive a reminder letter in the mail two months in advance. If you don't receive a letter, please call our office to schedule the follow-up appointment.   

## 2011-11-25 NOTE — Progress Notes (Signed)
History of Present Illness: [name] [name] is 78 yo WM with history of CAD, complete heart block s/p PPM, CVA, HLD, COPD, dementia, HTN, former tobacco abuse here today for cardiac follow up. He has been followed in the past by Dr. Juanda Chance. In 2009 he had a syncopal episode with documented ventricular tachycardia and underwent PCI of the circumflex artery with a drug-eluting stent. His pacemaker is followed by Dr. Graciela Husbands. Stress myoview October 2011 with no evidence of ischemia. He is not on a beta blocker secondary to intolerance in the past (syncope,weakness). He was admitted to [facility] 11/26-11/27/12. He presented with complaints of weakness and diaphoresis. This was similar to his previous anginal equivalent and left heart catheterization was recommended. LHC 02/08/11: CFX stent patent, LVF normal, left ventricle "spade-shaped" consistent with hypertensive heart disease. It was felt that his hypertension was the cause of his symptoms and his blood pressure medications were adjusted.   He is here today for follow up. He denies any chest pain, SOB or  palpitations. He has a long history of tobacco abuse and is smoking 2-3 cigarettes per day.  No dizziness, near syncope or syncope. He is retired from [employer]. He is taking all meds without problems.   Primary Care Physician: Eleonore Chiquito  Last Lipid Profile:Lipid Panel     Component Value Date/Time   CHOL 150 04/12/2011 0847   TRIG 61.0 04/12/2011 0847   HDL 53.90 04/12/2011 0847   CHOLHDL 3 04/12/2011 0847   VLDL 12.2 04/12/2011 0847   LDLCALC 84 04/12/2011 0847     Past Medical History  Diagnosis Date  . Pacemaker 08/09/2006    family denies this history  . Complete heart block 11/21/2008    intermittent  . BENIGN PROSTATIC HYPERTROPHY 08/09/2006  . CEREBROVASCULAR ACCIDENT, HX OF 08/09/2006  . COLONIC POLYPS, HX OF 08/09/2006  . COPD 01/13/2009  . CORONARY ARTERY DISEASE 01/12/2008    Drug eluting stent Circumflex 2009;  LHC 02/08/11:   CFX stent patent, LVF normal, left ventricle "spade-shaped" consistent with hypertensive heart disease  . GERD 08/09/2006  . HYPERLIPIDEMIA 08/09/2006  . HYPERTENSION, BENIGN 08/13/2008  . MELENA, HX OF 02/01/2008  . PHLEBITIS, LOWER EXTREMITY 06/08/2007  . Polymyalgia rheumatica 08/09/2006  . SYNCOPE 12/25/2008    Secondary heart block  . VENTRICULAR TACHYCARDIA 01/12/2008    Nonsustained  . WEAKNESS 11/21/2008  . Complication of anesthesia 02/08/11    "? 1966; I think he just quit breathing after Demerol"  . Pacemaker     DOI 2012  . Pneumonia 02/08/11    "walking pneumonia; several years ago"  . Whooping cough     "as a child"  . Cancer   . Arthritis   . DEMENTIA   . Atrioventricular block, complete     Past Surgical History  Procedure Date  . Lumbar laminectomy   . Transurethral resection of prostate   . Coronary angioplasty with stent placement   . Cataract extraction w/ intraocular lens  implant, bilateral ~ 2000  . Back surgery   . Insert / replace / remove pacemaker 09/07/10    initial placement  . Carotid endarterectomy     left    Current Outpatient Prescriptions  Medication Sig Dispense Refill  . aspirin EC 81 MG tablet Take 81 mg by mouth daily.        . clonazePAM (KLONOPIN) 0.5 MG tablet TAKE ONE-HALF TABLET BY MOUTH EVERY DAY  90 tablet  1  . clopidogrel (PLAVIX) 75 MG  tablet Take 1 tablet (75 mg total) by mouth daily.  90 tablet  3  . divalproex (DEPAKOTE) 250 MG DR tablet TAKE ONE TABLET BY MOUTH EVERY DAY  90 tablet  1  . Fluticasone-Salmeterol (ADVAIR DISKUS) 250-50 MCG/DOSE AEPB Inhale 1 puff into the lungs 2 (two) times daily.  60 each  6  . labetalol (NORMODYNE) 100 MG tablet Take 100-150 mg by mouth 2 (two) times daily. Takes 150 mg in the morning (1 and 1/2 tablets) and takes 100 mg (1 tablet) in the evening      . pravastatin (PRAVACHOL) 40 MG tablet Take 40 mg by mouth daily.       . ranitidine (ZANTAC) 150 MG tablet Take 150 mg by mouth at bedtime.         . rivastigmine (EXELON) 4.6 mg/24hr Place 1 patch (4.6 mg total) onto the skin daily.  30 patch  6  . tiotropium (SPIRIVA HANDIHALER) 18 MCG inhalation capsule Place 1 capsule (18 mcg total) into inhaler and inhale daily.  30 capsule  6    Allergies  Allergen Reactions  . Atorvastatin Other (See Comments)    Unknown.  . Demerol (Meperidine)   . Meperidine Hcl Other (See Comments)    Unknown Patient wife states it "knocked him out" years ago    History   Social History  . Marital Status: Married    Spouse Name: N/A    Number of Children: N/A  . Years of Education: N/A   Occupational History  . retired    Social History Main Topics  . Smoking status: Current Every Day Smoker -- 1.0 packs/day for 71 years    Types: Cigarettes, Cigars    Last Attempt to Quit: 03/16/1991  . Smokeless tobacco: Never Used   Comment: Pt is smoking 2 cigars a day.  . Alcohol Use: Yes     "used to have glass of wine or beer occasionally; hasn't had one since ~ 2002"  . Drug Use: No  . Sexually Active: No   Other Topics Concern  . Not on file   Social History Narrative  . No narrative on file    Family History  Problem Relation Age of Onset  . Heart disease Father     Review of Systems:  As stated in the HPI and otherwise negative.   BP 101/60  Pulse 64  Ht 6' (1.829 m)  Wt 183 lb (83.008 kg)  BMI 24.82 kg/m2  Physical Examination: General: Well developed, well nourished, NAD HEENT: OP clear, mucus membranes moist SKIN: warm, dry. No rashes. Neuro: No focal deficits Musculoskeletal: Muscle strength 5/5 all ext Psychiatric: Mood and affect normal Neck: No JVD, no carotid bruits, no thyromegaly, no lymphadenopathy. Lungs:Clear bilaterally, no wheezes, rhonci, crackles Cardiovascular: Regular rate and rhythm. No murmurs, gallops or rubs. Abdomen:Soft. Bowel sounds present. Non-tender.  Extremities: No lower extremity edema. Pulses are difficult to palpate in the bilateral  DP/PT.   Assessment and Plan:   1. CAD:  Stable. He has progressive dementia. No c/o chest pain or SOB. Will continue medical management with ASA/Plavix/statin. My hope would be that we can manage him conservatively for the remainder of his life given his advancing dementia.   2. Atrioventricular block, complete-intermittent: Followed by Dr. Sherryl Manges in EP clinic. Stable. PPM in place.   3. Pacemaker-[employer] implanted 2012: Interrogated July 2013 by Dr. Graciela Husbands.   4. HYPERTENSION: controlled.   5. Tobacco abuse: He does not wish to stop smoking

## 2011-12-01 ENCOUNTER — Encounter: Payer: Self-pay | Admitting: Internal Medicine

## 2011-12-08 ENCOUNTER — Ambulatory Visit (INDEPENDENT_AMBULATORY_CARE_PROVIDER_SITE_OTHER): Payer: Medicare Other

## 2011-12-08 DIAGNOSIS — Z23 Encounter for immunization: Secondary | ICD-10-CM

## 2011-12-13 ENCOUNTER — Ambulatory Visit (INDEPENDENT_AMBULATORY_CARE_PROVIDER_SITE_OTHER): Payer: Medicare Other | Admitting: Internal Medicine

## 2011-12-13 DIAGNOSIS — Z0000 Encounter for general adult medical examination without abnormal findings: Secondary | ICD-10-CM

## 2011-12-13 DIAGNOSIS — Z111 Encounter for screening for respiratory tuberculosis: Secondary | ICD-10-CM

## 2011-12-15 LAB — TB SKIN TEST
Induration: 0 mm
TB Skin Test: NEGATIVE

## 2011-12-27 ENCOUNTER — Encounter: Payer: Self-pay | Admitting: Internal Medicine

## 2011-12-27 ENCOUNTER — Ambulatory Visit (INDEPENDENT_AMBULATORY_CARE_PROVIDER_SITE_OTHER): Payer: Medicare Other | Admitting: *Deleted

## 2011-12-27 DIAGNOSIS — I442 Atrioventricular block, complete: Secondary | ICD-10-CM

## 2011-12-27 LAB — REMOTE PACEMAKER DEVICE
AL AMPLITUDE: 2.7 mv
AL THRESHOLD: 0.625 V
BAMS-0001: 150 {beats}/min
BAMS-0003: 70 {beats}/min
DEVICE MODEL PM: 7241194
RV LEAD AMPLITUDE: 12 mv
RV LEAD THRESHOLD: 0.875 V
VENTRICULAR PACING PM: 1

## 2012-01-05 ENCOUNTER — Encounter: Payer: Self-pay | Admitting: Internal Medicine

## 2012-01-05 ENCOUNTER — Ambulatory Visit (INDEPENDENT_AMBULATORY_CARE_PROVIDER_SITE_OTHER): Payer: Medicare Other | Admitting: Internal Medicine

## 2012-01-05 VITALS — BP 97/58 | HR 69 | Ht 72.0 in | Wt 184.2 lb

## 2012-01-05 DIAGNOSIS — I442 Atrioventricular block, complete: Secondary | ICD-10-CM

## 2012-01-05 DIAGNOSIS — Z950 Presence of cardiac pacemaker: Secondary | ICD-10-CM

## 2012-01-05 LAB — PACEMAKER DEVICE OBSERVATION
AL AMPLITUDE: 1.9 mv
AL IMPEDENCE PM: 430 Ohm
BAMS-0001: 150 {beats}/min
BATTERY VOLTAGE: 2.98 V
RV LEAD AMPLITUDE: 12 mv
RV LEAD IMPEDENCE PM: 590 Ohm
VENTRICULAR PACING PM: 1

## 2012-01-05 MED ORDER — LABETALOL HCL 100 MG PO TABS: 100.0000 mg | ORAL_TABLET | Freq: Two times a day (BID) | ORAL | Status: DC

## 2012-01-05 NOTE — Patient Instructions (Addendum)
Remote monitoring is used to monitor your Pacemaker of ICD from home. This monitoring reduces the number of office visits required to check your device to one time per year. It allows [place] to keep an eye on the functioning of your device to ensure it is working properly. You are scheduled for a device check from home on April 10, 2012. You may send your transmission at any time that day. If you have a wireless device, the transmission will be sent automatically. After your physician reviews your transmission, you will receive a postcard with your next transmission date.  Your physician wants you to follow-up in: 1 year with Dr Graciela Husbands (October 2014).  You will receive a reminder letter in the mail two months in advance. If you don't receive a letter, please call our office to schedule the follow-up appointment.   Change Normodyne to 100mg  twice daily.

## 2012-01-05 NOTE — Assessment & Plan Note (Signed)
Stable post pacing 

## 2012-01-05 NOTE — Assessment & Plan Note (Signed)
The patient's device was interrogated.  The information was reviewed. No changes were made in the programming.    

## 2012-01-05 NOTE — Progress Notes (Signed)
Patient Care Team: [name], MD as PCP - General Kathleene Hazel, MD as Consulting Physician (Cardiology)   HPI  [name] [name] is a 78 y.o. male Seen in followup for intermittent complete heart block for which she underwent pacing spring 2012.  He also has a history of coronary artery disease with prior circumflex stenting. Echo March 2013 demonstrated normal left ventricular function without significant valvular issues.  Catheterization November 2012 demonstrated the circumflex otherwise normal vessels Myoview October 2011 demonstrated no ischemia  He has a history of syncope apparently associated with ventricular tachycardia.  The patient denies chest pain, shortness of breath, nocturnal dyspnea, orthopnea or peripheral edema. T  His memory continues to be an issue. His irritability remains an issue also.    Past Medical History  Diagnosis Date  . Pacemaker 08/09/2006    family denies this history  . Complete heart block 11/21/2008    intermittent  . BENIGN PROSTATIC HYPERTROPHY 08/09/2006  . CEREBROVASCULAR ACCIDENT, HX OF 08/09/2006  . COLONIC POLYPS, HX OF 08/09/2006  . COPD 01/13/2009  . CORONARY ARTERY DISEASE 01/12/2008    Drug eluting stent Circumflex 2009;  LHC 02/08/11:  CFX stent patent, LVF normal, left ventricle "spade-shaped" consistent with hypertensive heart disease  . GERD 08/09/2006  . HYPERLIPIDEMIA 08/09/2006  . HYPERTENSION, BENIGN 08/13/2008  . MELENA, HX OF 02/01/2008  . PHLEBITIS, LOWER EXTREMITY 06/08/2007  . Polymyalgia rheumatica 08/09/2006  . SYNCOPE 12/25/2008    Secondary heart block  . VENTRICULAR TACHYCARDIA 01/12/2008    Nonsustained  . WEAKNESS 11/21/2008  . Complication of anesthesia 02/08/11    "? 1966; I think he just quit breathing after Demerol"  . Pacemaker     DOI 2012  . Pneumonia 02/08/11    "walking pneumonia; several years ago"  . Whooping cough     "as a child"  . Cancer   . Arthritis   . DEMENTIA   .  Atrioventricular block, complete     Past Surgical History  Procedure Date  . Lumbar laminectomy   . Transurethral resection of prostate   . Coronary angioplasty with stent placement   . Cataract extraction w/ intraocular lens  implant, bilateral ~ 2000  . Back surgery   . Insert / replace / remove pacemaker 09/07/10    initial placement  . Carotid endarterectomy     left    Current Outpatient Prescriptions  Medication Sig Dispense Refill  . aspirin EC 81 MG tablet Take 81 mg by mouth daily.        . clonazePAM (KLONOPIN) 0.5 MG tablet TAKE ONE-HALF TABLET BY MOUTH EVERY DAY  90 tablet  1  . clopidogrel (PLAVIX) 75 MG tablet Take 1 tablet (75 mg total) by mouth daily.  90 tablet  3  . divalproex (DEPAKOTE) 250 MG DR tablet TAKE ONE TABLET BY MOUTH EVERY DAY  90 tablet  1  . Fluticasone-Salmeterol (ADVAIR DISKUS) 250-50 MCG/DOSE AEPB Inhale 1 puff into the lungs 2 (two) times daily.  60 each  6  . labetalol (NORMODYNE) 100 MG tablet Take 100-150 mg by mouth 2 (two) times daily. Takes 150 mg in the morning (1 and 1/2 tablets) and takes 100 mg (1 tablet) in the evening      . pravastatin (PRAVACHOL) 40 MG tablet Take 40 mg by mouth daily.       . ranitidine (ZANTAC) 150 MG tablet Take 150 mg by mouth at bedtime.        . rivastigmine (  EXELON) 4.6 mg/24hr Place 1 patch (4.6 mg total) onto the skin daily.  30 patch  6  . tiotropium (SPIRIVA HANDIHALER) 18 MCG inhalation capsule Place 1 capsule (18 mcg total) into inhaler and inhale daily.  30 capsule  6    Allergies  Allergen Reactions  . Atorvastatin Other (See Comments)    Unknown.  . Demerol (Meperidine)   . Meperidine Hcl Other (See Comments)    Unknown Patient wife states it "knocked him out" years ago    Review of Systems negative except from HPI and PMH  Physical Exam BP 97/58  Pulse 69  Ht 6' (1.829 m)  Wt 184 lb 3.2 oz (83.553 kg)  BMI 24.98 kg/m2 Well developed and nourished in no acute distress HENT normal Neck  supple with JVP-flat Clear Regular rate and rhythm, no murmurs or gallops Abd-soft with active BS No Clubbing cyanosis edema Skin-warm and dry A & Oriented  Grossly normal sensory and motor function     Assessment and  Plan

## 2012-01-11 ENCOUNTER — Telehealth: Payer: Self-pay | Admitting: Internal Medicine

## 2012-01-11 NOTE — Telephone Encounter (Signed)
Returned call to Okey Regal - daughter - wanting to know if Dr. Amador Cunas would admit to hospital then have him admitted to nursing home because he is going to go willingly. I explained that I understood but we cant admit him without cause , if he ends up in ER - and then gets admitted - it would need to be discussed at that time

## 2012-01-11 NOTE — Telephone Encounter (Signed)
Pt daughter(carol( would like kim to return her call. Daughter decline to talk with triage nurse

## 2012-01-14 ENCOUNTER — Encounter: Payer: Self-pay | Admitting: *Deleted

## 2012-01-19 ENCOUNTER — Telehealth: Payer: Self-pay | Admitting: Internal Medicine

## 2012-01-19 NOTE — Telephone Encounter (Signed)
Loreen at [employer].  Patient is to be admitted to the dementia unit Friday, 11/8 .  Patient needs a PASRR number.  States that same is generated by MD office.    States that she has the FL2 form.

## 2012-01-20 NOTE — Telephone Encounter (Signed)
Spoke with Lorreen - she states there is a web site for the state that the doctors will have to go on, put in their password and then answer questions r/t pt - then it will generate a letter that will have this PASRR number. State the discharge planners at the hospital are "pros at this" . Please assist - I am unaware of the site or the [facility] #

## 2012-01-21 ENCOUNTER — Encounter: Payer: Self-pay | Admitting: Internal Medicine

## 2012-01-21 ENCOUNTER — Ambulatory Visit (INDEPENDENT_AMBULATORY_CARE_PROVIDER_SITE_OTHER): Payer: Medicare Other | Admitting: Internal Medicine

## 2012-01-21 VITALS — BP 102/70 | Temp 97.6°F | Wt 185.0 lb

## 2012-01-21 DIAGNOSIS — I6990 Unspecified sequelae of unspecified cerebrovascular disease: Secondary | ICD-10-CM

## 2012-01-21 DIAGNOSIS — J449 Chronic obstructive pulmonary disease, unspecified: Secondary | ICD-10-CM

## 2012-01-21 DIAGNOSIS — I442 Atrioventricular block, complete: Secondary | ICD-10-CM

## 2012-01-21 DIAGNOSIS — I10 Essential (primary) hypertension: Secondary | ICD-10-CM

## 2012-01-21 NOTE — Progress Notes (Signed)
Subjective:    Patient ID: [name] [name], male    DOB: [birth year], 78 y.o.   MRN: [id number]  HPI  78 year old patient who is seen today for followup. He has a history of moderate COPD as well as coronary artery disease. He is status post pacemaker insertion for complete heart block he has treated hypertension and dyslipidemia. He has a history of progressive dementia and will be transferred to a extended care facility later today. Nursing home forms have been completed.  He is accompanied by his wife and daughter today. He will be residing at [facility]. His cardiopulmonary status has been stable.  Past Medical History  Diagnosis Date  . Pacemaker 08/09/2006    family denies this history  . Complete heart block 11/21/2008    intermittent  . BENIGN PROSTATIC HYPERTROPHY 08/09/2006  . CEREBROVASCULAR ACCIDENT, HX OF 08/09/2006  . COLONIC POLYPS, HX OF 08/09/2006  . COPD 01/13/2009  . CORONARY ARTERY DISEASE 01/12/2008    Drug eluting stent Circumflex 2009;  LHC 02/08/11:  CFX stent patent, LVF normal, left ventricle "spade-shaped" consistent with hypertensive heart disease  . GERD 08/09/2006  . HYPERLIPIDEMIA 08/09/2006  . HYPERTENSION, BENIGN 08/13/2008  . MELENA, HX OF 02/01/2008  . PHLEBITIS, LOWER EXTREMITY 06/08/2007  . Polymyalgia rheumatica 08/09/2006  . SYNCOPE 12/25/2008    Secondary heart block  . VENTRICULAR TACHYCARDIA 01/12/2008    Nonsustained  . WEAKNESS 11/21/2008  . Complication of anesthesia 02/08/11    "? 1966; I think he just quit breathing after Demerol"  . Pacemaker     DOI 2012  . Pneumonia 02/08/11    "walking pneumonia; several years ago"  . Whooping cough     "as a child"  . Cancer   . Arthritis   . DEMENTIA   . Atrioventricular block, complete     History   Social History  . Marital Status: Married    Spouse Name: N/A    Number of Children: N/A  . Years of Education: N/A   Occupational History  . retired    Social History Main Topics  . Smoking  status: Current Every Day Smoker -- 1.0 packs/day for 71 years    Types: Cigarettes, Cigars    Last Attempt to Quit: 03/16/1991  . Smokeless tobacco: Never Used     Comment: Pt is smoking 2 cigars a day.  . Alcohol Use: Yes     Comment: "used to have glass of wine or beer occasionally; hasn't had one since ~ 2002"  . Drug Use: No  . Sexually Active: No   Other Topics Concern  . Not on file   Social History Narrative  . No narrative on file    Past Surgical History  Procedure Date  . Lumbar laminectomy   . Transurethral resection of prostate   . Coronary angioplasty with stent placement   . Cataract extraction w/ intraocular lens  implant, bilateral ~ 2000  . Back surgery   . Insert / replace / remove pacemaker 09/07/10    initial placement  . Carotid endarterectomy     left    Family History  Problem Relation Age of Onset  . Heart disease Father     Allergies  Allergen Reactions  . Atorvastatin Other (See Comments)    Unknown.  . Demerol (Meperidine)   . Meperidine Hcl Other (See Comments)    Unknown Patient wife states it "knocked him out" years ago    Current Outpatient Prescriptions on File Prior to  Visit  Medication Sig Dispense Refill  . aspirin EC 81 MG tablet Take 81 mg by mouth daily.        . clonazePAM (KLONOPIN) 0.5 MG tablet TAKE ONE-HALF TABLET BY MOUTH EVERY DAY  90 tablet  1  . clopidogrel (PLAVIX) 75 MG tablet Take 1 tablet (75 mg total) by mouth daily.  90 tablet  3  . divalproex (DEPAKOTE) 250 MG DR tablet TAKE ONE TABLET BY MOUTH EVERY DAY  90 tablet  1  . Fluticasone-Salmeterol (ADVAIR DISKUS) 250-50 MCG/DOSE AEPB Inhale 1 puff into the lungs 2 (two) times daily.  60 each  6  . labetalol (NORMODYNE) 100 MG tablet Take 1 tablet (100 mg total) by mouth 2 (two) times daily.      . pravastatin (PRAVACHOL) 40 MG tablet Take 40 mg by mouth daily.       . ranitidine (ZANTAC) 150 MG tablet Take 150 mg by mouth at bedtime.        . rivastigmine  (EXELON) 4.6 mg/24hr Place 1 patch (4.6 mg total) onto the skin daily.  30 patch  6  . tiotropium (SPIRIVA HANDIHALER) 18 MCG inhalation capsule Place 1 capsule (18 mcg total) into inhaler and inhale daily.  30 capsule  6    BP 102/70  Temp 97.6 F (36.4 C) (Oral)  Wt 185 lb (83.915 kg)       Review of Systems  Constitutional: Negative for fever, chills, appetite change and fatigue.  HENT: Negative for hearing loss, ear pain, congestion, sore throat, trouble swallowing, neck stiffness, dental problem, voice change and tinnitus.   Eyes: Negative for pain, discharge and visual disturbance.  Respiratory: Negative for cough, chest tightness, wheezing and stridor.   Cardiovascular: Negative for chest pain, palpitations and leg swelling.  Gastrointestinal: Negative for nausea, vomiting, abdominal pain, diarrhea, constipation, blood in stool and abdominal distention.  Genitourinary: Negative for urgency, hematuria, flank pain, discharge, difficulty urinating and genital sores.  Musculoskeletal: Negative for myalgias, back pain, joint swelling, arthralgias and gait problem.  Skin: Negative for rash.  Neurological: Negative for dizziness, syncope, speech difficulty, weakness, numbness and headaches.  Hematological: Negative for adenopathy. Does not bruise/bleed easily.  Psychiatric/Behavioral: Positive for confusion and decreased concentration. Negative for behavioral problems and dysphoric mood. The patient is not nervous/anxious.        Objective:   Physical Exam  Constitutional: He is oriented to person, place, and time. He appears well-developed.  HENT:  Head: Normocephalic.  Right Ear: External ear normal.  Left Ear: External ear normal.  Eyes: Conjunctivae normal and EOM are normal.  Neck: Normal range of motion.  Cardiovascular: Normal rate and normal heart sounds.   Pulmonary/Chest: Breath sounds normal.       O2 saturation 96  Abdominal: Bowel sounds are normal.    Musculoskeletal: Normal range of motion. He exhibits no edema and no tenderness.  Neurological: He is alert and oriented to person, place, and time.  Psychiatric: He has a normal mood and affect. His behavior is normal.          Assessment & Plan:   Progressive dementia. Transfer to extended care facility today Hypertension stable. No change in therapy Coronary artery disease asymptomatic Status post pacemaker for complete heart block. COPD stable

## 2012-01-21 NOTE — Patient Instructions (Signed)
Limit your sodium (Salt) intake  Return in 6 months for follow-up  

## 2012-02-04 ENCOUNTER — Ambulatory Visit: Payer: Medicare Other | Admitting: Internal Medicine

## 2012-02-11 ENCOUNTER — Encounter: Payer: Medicare Other | Admitting: Internal Medicine

## 2012-03-30 ENCOUNTER — Telehealth: Payer: Self-pay | Admitting: Internal Medicine

## 2012-03-30 NOTE — Telephone Encounter (Signed)
Will forward to the device clinic 

## 2012-03-30 NOTE — Telephone Encounter (Signed)
Pt now resides at [facility] full time and wife brought transmitter to do remotes, they just need a fax order for the, to be able to do his transmissions, fax [phone]

## 2012-03-31 ENCOUNTER — Telehealth: Payer: Self-pay | Admitting: *Deleted

## 2012-03-31 ENCOUNTER — Encounter: Payer: Self-pay | Admitting: *Deleted

## 2012-03-31 NOTE — Telephone Encounter (Signed)
Order sent to [facility] for pt send remote transmissions every 90 days. Confirmation fax was received.

## 2012-04-10 ENCOUNTER — Encounter: Payer: Medicare Other | Admitting: *Deleted

## 2012-04-11 ENCOUNTER — Encounter: Payer: Self-pay | Admitting: *Deleted

## 2012-04-13 ENCOUNTER — Telehealth: Payer: Self-pay | Admitting: Internal Medicine

## 2012-04-13 NOTE — Telephone Encounter (Signed)
New Problem:    Patient's wife called in wanting to speak with someone about her husbands transmission that was supposed to be sent on 04/03/12.  Please call back.

## 2012-04-13 NOTE — Telephone Encounter (Signed)
Left message for patient to send missed transmission from 04/10/12.

## 2012-04-14 ENCOUNTER — Telehealth: Payer: Self-pay | Admitting: Internal Medicine

## 2012-04-14 ENCOUNTER — Ambulatory Visit (INDEPENDENT_AMBULATORY_CARE_PROVIDER_SITE_OTHER): Payer: Medicare Other | Admitting: *Deleted

## 2012-04-14 DIAGNOSIS — I442 Atrioventricular block, complete: Secondary | ICD-10-CM

## 2012-04-14 NOTE — Telephone Encounter (Signed)
Spoke w/[facility] and device will be checked there. Unable to get [facility] transmissions to go thru.

## 2012-04-14 NOTE — Telephone Encounter (Signed)
Pt's nurse calling re transmission Monday the 27th, she did it herself and did it twice and said all the buttons went off as normal and not sure why we didn't get it, can you call to make sure we didn't get it? Having trouble with phone lines so if she doesn't answer pls leave a message

## 2012-04-14 NOTE — Telephone Encounter (Signed)
Will forward to device clinic  

## 2012-05-11 DIAGNOSIS — R0989 Other specified symptoms and signs involving the circulatory and respiratory systems: Secondary | ICD-10-CM

## 2012-05-11 LAB — PACEMAKER DEVICE OBSERVATION
AL THRESHOLD: 0.75 V
BAMS-0001: 150 {beats}/min
BAMS-0003: 70 {beats}/min
DEVICE MODEL PM: 7241194
RV LEAD AMPLITUDE: 12 mv
VENTRICULAR PACING PM: 1

## 2012-05-29 ENCOUNTER — Encounter: Payer: Self-pay | Admitting: Internal Medicine

## 2012-06-05 ENCOUNTER — Encounter: Payer: Self-pay | Admitting: Cardiovascular Disease

## 2012-06-05 ENCOUNTER — Ambulatory Visit (INDEPENDENT_AMBULATORY_CARE_PROVIDER_SITE_OTHER): Payer: Medicare Other | Admitting: Cardiovascular Disease

## 2012-06-05 VITALS — BP 90/52 | HR 78 | Ht 72.0 in | Wt 180.0 lb

## 2012-06-05 DIAGNOSIS — I2510 Atherosclerotic heart disease of native coronary artery without angina pectoris: Secondary | ICD-10-CM

## 2012-06-05 DIAGNOSIS — I442 Atrioventricular block, complete: Secondary | ICD-10-CM

## 2012-06-05 DIAGNOSIS — I10 Essential (primary) hypertension: Secondary | ICD-10-CM

## 2012-06-05 NOTE — Progress Notes (Signed)
History of Present Illness: [name] [name] is 79 yo WM with history of CAD, complete heart block s/p PPM, CVA, HLD, COPD, dementia, HTN, former tobacco abuse here today for cardiac follow up. He has been followed in the past by Dr. Juanda Chance. In 2009 he had a syncopal episode with documented ventricular tachycardia and underwent PCI of the circumflex artery with a drug-eluting stent. His pacemaker is followed by Dr. Graciela Husbands. Stress myoview October 2011 with no evidence of ischemia. He is not on a beta blocker secondary to intolerance in the past (syncope,weakness). He was admitted to [facility] 11/26-11/27/12 with complaints of weakness and diaphoresis. This was similar to his previous anginal equivalent and left heart catheterization was recommended. LHC 02/08/11: CFX stent patent, LVF normal, left ventricle "spade-shaped" consistent with hypertensive heart disease. It was felt that his hypertension was the cause of his symptoms and his blood pressure medications were adjusted. Last seen by Dr. Graciela Husbands October 2013.  He is here today for follow up. He denies any chest pain, SOB or palpitations. He has a long history of tobacco abuse and has stopped smoking. No dizziness, near syncope or syncope. He is retired from [employer]. He is taking all meds without problems.   Primary Care Physician: Eleonore Chiquito  Last Lipid Profile:Lipid Panel     Component Value Date/Time   CHOL 150 04/12/2011 0847   TRIG 61.0 04/12/2011 0847   HDL 53.90 04/12/2011 0847   CHOLHDL 3 04/12/2011 0847   VLDL 12.2 04/12/2011 0847   LDLCALC 84 04/12/2011 0847     Past Medical History  Diagnosis Date  . Pacemaker 08/09/2006    family denies this history  . Complete heart block 11/21/2008    intermittent  . BENIGN PROSTATIC HYPERTROPHY 08/09/2006  . CEREBROVASCULAR ACCIDENT, HX OF 08/09/2006  . COLONIC POLYPS, HX OF 08/09/2006  . COPD 01/13/2009  . CORONARY ARTERY DISEASE 01/12/2008    Drug eluting stent Circumflex 2009;  LHC  02/08/11:  CFX stent patent, LVF normal, left ventricle "spade-shaped" consistent with hypertensive heart disease  . GERD 08/09/2006  . HYPERLIPIDEMIA 08/09/2006  . HYPERTENSION, BENIGN 08/13/2008  . MELENA, HX OF 02/01/2008  . PHLEBITIS, LOWER EXTREMITY 06/08/2007  . Polymyalgia rheumatica 08/09/2006  . SYNCOPE 12/25/2008    Secondary heart block  . VENTRICULAR TACHYCARDIA 01/12/2008    Nonsustained  . WEAKNESS 11/21/2008  . Complication of anesthesia 02/08/11    "? 1966; I think he just quit breathing after Demerol"  . Pacemaker     DOI 2012  . Pneumonia 02/08/11    "walking pneumonia; several years ago"  . Whooping cough     "as a child"  . Cancer   . Arthritis   . DEMENTIA   . Atrioventricular block, complete     Past Surgical History  Procedure Laterality Date  . Lumbar laminectomy    . Transurethral resection of prostate    . Coronary angioplasty with stent placement    . Cataract extraction w/ intraocular lens  implant, bilateral  ~ 2000  . Back surgery    . Insert / replace / remove pacemaker  09/07/10    initial placement  . Carotid endarterectomy      left    Current Outpatient Prescriptions  Medication Sig Dispense Refill  . aspirin EC 81 MG tablet Take 81 mg by mouth daily.        . clonazePAM (KLONOPIN) 0.5 MG tablet TAKE ONE-HALF TABLET BY MOUTH EVERY DAY  90 tablet  1  . clopidogrel (PLAVIX) 75 MG tablet Take 1 tablet (75 mg total) by mouth daily.  90 tablet  3  . divalproex (DEPAKOTE) 250 MG DR tablet TAKE ONE TABLET BY MOUTH EVERY DAY  90 tablet  1  . Fluticasone-Salmeterol (ADVAIR DISKUS) 250-50 MCG/DOSE AEPB Inhale 1 puff into the lungs 2 (two) times daily.  60 each  6  . labetalol (NORMODYNE) 100 MG tablet Take 1 tablet (100 mg total) by mouth 2 (two) times daily.      . pravastatin (PRAVACHOL) 40 MG tablet Take 40 mg by mouth daily.       . ranitidine (ZANTAC) 150 MG tablet Take 150 mg by mouth at bedtime.        . rivastigmine (EXELON) 4.6 mg/24hr Place 1  patch (4.6 mg total) onto the skin daily.  30 patch  6  . tiotropium (SPIRIVA HANDIHALER) 18 MCG inhalation capsule Place 1 capsule (18 mcg total) into inhaler and inhale daily.  30 capsule  6   No current facility-administered medications for this visit.    Allergies  Allergen Reactions  . Atorvastatin Other (See Comments)    Unknown.  . Demerol (Meperidine)   . Meperidine Hcl Other (See Comments)    Unknown Patient wife states it "knocked him out" years ago    History   Social History  . Marital Status: Married    Spouse Name: N/A    Number of Children: N/A  . Years of Education: N/A   Occupational History  . retired    Social History Main Topics  . Smoking status: Current Every Day Smoker -- 1.00 packs/day for 71 years    Types: Cigarettes, Cigars    Last Attempt to Quit: 03/16/1991  . Smokeless tobacco: Never Used     Comment: Pt is smoking 2 cigars a day.  . Alcohol Use: Yes     Comment: "used to have glass of wine or beer occasionally; hasn't had one since ~ 2002"  . Drug Use: No  . Sexually Active: No   Other Topics Concern  . Not on file   Social History Narrative  . No narrative on file    Family History  Problem Relation Age of Onset  . Heart disease Father     Review of Systems:  As stated in the HPI and otherwise negative.   BP 90/52  Pulse 78  Ht 6' (1.829 m)  Wt 180 lb (81.647 kg)  BMI 24.41 kg/m2  SpO2 97%  Physical Examination: General: Well developed, well nourished, NAD. Very pleasant HEENT: OP clear, mucus membranes moist SKIN: warm, dry. No rashes. Neuro: No focal deficits Musculoskeletal: Muscle strength 5/5 all ext Psychiatric: Mood and affect normal Neck: No JVD, no carotid bruits, no thyromegaly, no lymphadenopathy. Lungs:Clear bilaterally, no wheezes, rhonci, crackles Cardiovascular: Regular rate and rhythm. No murmurs, gallops or rubs. Abdomen:Soft. Bowel sounds present. Non-tender.  Extremities: No lower extremity edema.  Pulses are trace  in the bilateral DP/PT.  Assessment and Plan:   1. CAD: Stable. He has progressive dementia. No c/o chest pain or SOB. Will continue medical management with ASA/Plavix/statin. My hope would be that we can manage him conservatively for the remainder of his life given his advancing dementia.   2. Atrioventricular block, complete-intermittent with pacemaker in place: Followed by Dr. Graciela Husbands in EP clinic. Stable. PPM in place.   3. HYPERTENSION: Controlled. No changes.  4. Tobacco abuse: He has stopped smoking.

## 2012-06-05 NOTE — Patient Instructions (Addendum)
Your physician wants you to follow-up in:  6 months. You will receive a reminder letter in the mail two months in advance. If you don't receive a letter, please call our office to schedule the follow-up appointment.   

## 2012-07-19 ENCOUNTER — Ambulatory Visit (INDEPENDENT_AMBULATORY_CARE_PROVIDER_SITE_OTHER): Payer: Medicare Other | Admitting: Internal Medicine

## 2012-07-19 ENCOUNTER — Encounter: Payer: Self-pay | Admitting: Internal Medicine

## 2012-07-19 VITALS — BP 90/60 | HR 91 | Temp 98.2°F | Resp 20 | Wt 181.0 lb

## 2012-07-19 DIAGNOSIS — I10 Essential (primary) hypertension: Secondary | ICD-10-CM

## 2012-07-19 DIAGNOSIS — D649 Anemia, unspecified: Secondary | ICD-10-CM

## 2012-07-19 DIAGNOSIS — I6990 Unspecified sequelae of unspecified cerebrovascular disease: Secondary | ICD-10-CM

## 2012-07-19 DIAGNOSIS — J449 Chronic obstructive pulmonary disease, unspecified: Secondary | ICD-10-CM

## 2012-07-19 DIAGNOSIS — R413 Other amnesia: Secondary | ICD-10-CM

## 2012-07-19 LAB — CBC WITH DIFFERENTIAL/PLATELET
Basophils Relative: 0.5 % (ref 0.0–3.0)
Eosinophils Relative: 1.4 % (ref 0.0–5.0)
Hemoglobin: 12.2 g/dL — ABNORMAL LOW (ref 13.0–17.0)
Lymphocytes Relative: 22.1 % (ref 12.0–46.0)
Neutro Abs: 4 10*3/uL (ref 1.4–7.7)
Neutrophils Relative %: 65.6 % (ref 43.0–77.0)
RBC: 3.92 Mil/uL — ABNORMAL LOW (ref 4.22–5.81)
WBC: 6 10*3/uL (ref 4.5–10.5)

## 2012-07-19 LAB — BASIC METABOLIC PANEL
Calcium: 9 mg/dL (ref 8.4–10.5)
Chloride: 105 mEq/L (ref 96–112)
Creatinine, Ser: 1.3 mg/dL (ref 0.4–1.5)
Sodium: 139 mEq/L (ref 135–145)

## 2012-07-19 MED ORDER — METOPROLOL TARTRATE 25 MG PO TABS: 25.0000 mg | ORAL_TABLET | Freq: Two times a day (BID) | ORAL | Status: DC

## 2012-07-19 NOTE — Progress Notes (Signed)
Subjective:    Patient ID: [name] [name], male    DOB: Apr 14, 1925, 79 y.o.   MRN: [id number]  HPI  79 year old patient who is seen today for his six-month followup. He has a history of coronary artery disease and complete AV block status post pacemaker insertion. He has hypertension. He has a history of dementia COPD and also polymyalgia rheumatica. There's a history of prior anemia. He has done well except for his dementia he has become more confused and at times agitated area his wife describes the increasing anxiety.  Medical regimen reviewed and includes labetalol 100 mg twice daily.  Past Medical History  Diagnosis Date  . Pacemaker 08/09/2006    family denies this history  . Complete heart block 11/21/2008    intermittent  . BENIGN PROSTATIC HYPERTROPHY 08/09/2006  . CEREBROVASCULAR ACCIDENT, HX OF 08/09/2006  . COLONIC POLYPS, HX OF 08/09/2006  . COPD 01/13/2009  . CORONARY ARTERY DISEASE 01/12/2008    Drug eluting stent Circumflex 2009;  LHC 02/08/11:  CFX stent patent, LVF normal, left ventricle "spade-shaped" consistent with hypertensive heart disease  . GERD 08/09/2006  . HYPERLIPIDEMIA 08/09/2006  . HYPERTENSION, BENIGN 08/13/2008  . MELENA, HX OF 02/01/2008  . PHLEBITIS, LOWER EXTREMITY 06/08/2007  . Polymyalgia rheumatica 08/09/2006  . SYNCOPE 12/25/2008    Secondary heart block  . VENTRICULAR TACHYCARDIA 01/12/2008    Nonsustained  . WEAKNESS 11/21/2008  . Complication of anesthesia 02/08/11    "? 1966; I think he just quit breathing after Demerol"  . Pacemaker     DOI 2012  . Pneumonia 02/08/11    "walking pneumonia; several years ago"  . Whooping cough     "as a child"  . Cancer   . Arthritis   . DEMENTIA   . Atrioventricular block, complete     History   Social History  . Marital Status: Married    Spouse Name: N/A    Number of Children: N/A  . Years of Education: N/A   Occupational History  . retired    Social History Main Topics  . Smoking status: Current  Every Day Smoker -- 1.00 packs/day for 71 years    Types: Cigarettes, Cigars    Last Attempt to Quit: 03/16/1991  . Smokeless tobacco: Never Used     Comment: Pt is smoking 2 cigars a day.  . Alcohol Use: Yes     Comment: "used to have glass of wine or beer occasionally; hasn't had one since ~ 2002"  . Drug Use: No  . Sexually Active: No   Other Topics Concern  . Not on file   Social History Narrative  . No narrative on file    Past Surgical History  Procedure Laterality Date  . Lumbar laminectomy    . Transurethral resection of prostate    . Coronary angioplasty with stent placement    . Cataract extraction w/ intraocular lens  implant, bilateral  ~ 2000  . Back surgery    . Insert / replace / remove pacemaker  09/07/10    initial placement  . Carotid endarterectomy      left    Family History  Problem Relation Age of Onset  . Heart disease Father     Allergies  Allergen Reactions  . Atorvastatin Other (See Comments)    Unknown.  . Demerol (Meperidine)   . Meperidine Hcl Other (See Comments)    Unknown Patient wife states it "knocked him out" years ago    Current Outpatient  Prescriptions on File Prior to Visit  Medication Sig Dispense Refill  . aspirin EC 81 MG tablet Take 81 mg by mouth daily.        . clonazePAM (KLONOPIN) 0.5 MG tablet TAKE ONE-HALF TABLET BY MOUTH EVERY DAY  90 tablet  1  . clopidogrel (PLAVIX) 75 MG tablet Take 1 tablet (75 mg total) by mouth daily.  90 tablet  3  . divalproex (DEPAKOTE) 250 MG DR tablet TAKE ONE TABLET BY MOUTH EVERY DAY  90 tablet  1  . labetalol (NORMODYNE) 100 MG tablet Take 1 tablet (100 mg total) by mouth 2 (two) times daily.      . pravastatin (PRAVACHOL) 40 MG tablet Take 40 mg by mouth daily.       . ranitidine (ZANTAC) 150 MG tablet Take 150 mg by mouth at bedtime.        . rivastigmine (EXELON) 4.6 mg/24hr Place 1 patch (4.6 mg total) onto the skin daily.  30 patch  6  . Fluticasone-Salmeterol (ADVAIR DISKUS)  250-50 MCG/DOSE AEPB Inhale 1 puff into the lungs 2 (two) times daily.  60 each  6  . tiotropium (SPIRIVA HANDIHALER) 18 MCG inhalation capsule Place 1 capsule (18 mcg total) into inhaler and inhale daily.  30 capsule  6   No current facility-administered medications on file prior to visit.    BP 90/60  Pulse 91  Temp(Src) 98.2 F (36.8 C) (Oral)  Resp 20  Wt 181 lb (82.101 kg)  BMI 24.54 kg/m2  SpO2 97%       Review of Systems  Constitutional: Negative for fever, chills, appetite change and fatigue.  HENT: Negative for hearing loss, ear pain, congestion, sore throat, trouble swallowing, neck stiffness, dental problem, voice change and tinnitus.   Eyes: Negative for pain, discharge and visual disturbance.  Respiratory: Negative for cough, chest tightness, wheezing and stridor.   Cardiovascular: Negative for chest pain, palpitations and leg swelling.  Gastrointestinal: Negative for nausea, vomiting, abdominal pain, diarrhea, constipation, blood in stool and abdominal distention.  Genitourinary: Negative for urgency, hematuria, flank pain, discharge, difficulty urinating and genital sores.  Musculoskeletal: Negative for myalgias, back pain, joint swelling, arthralgias and gait problem.  Skin: Negative for rash.  Neurological: Negative for dizziness, syncope, speech difficulty, weakness, numbness and headaches.  Hematological: Negative for adenopathy. Does not bruise/bleed easily.  Psychiatric/Behavioral: Positive for behavioral problems, confusion, decreased concentration and agitation. Negative for dysphoric mood. The patient is nervous/anxious.        Objective:   Physical Exam  Constitutional: He is oriented to person, place, and time. He appears well-developed.  Blood pressure is 70/40  HENT:  Head: Normocephalic.  Right Ear: External ear normal.  Left Ear: External ear normal.  Eyes: Conjunctivae and EOM are normal.  Neck: Normal range of motion.  Cardiovascular:  Normal rate, regular rhythm and normal heart sounds.   Pulmonary/Chest: Breath sounds normal.  Subcutaneous pacemaker left upper anterior chest wall  Abdominal: Bowel sounds are normal.  Musculoskeletal: Normal range of motion. He exhibits no edema and no tenderness.  Trace ankle edema right greater than left  Neurological: He is alert and oriented to person, place, and time.  Psychiatric: He has a normal mood and affect. His behavior is normal.          Assessment & Plan:   Hypotension- we'll discontinue labetalol and substitute metoprolol 25 mg twice a day Coronary artery disease Dementia. We'll continue present regimen including anxiolytics COPD stable

## 2012-07-19 NOTE — Patient Instructions (Signed)
Limit your sodium (Salt) intake  Return in 6 months for follow-up  

## 2012-07-20 ENCOUNTER — Ambulatory Visit: Payer: Medicare Other | Admitting: Internal Medicine

## 2012-07-20 IMAGING — CT CT HEAD W/O CM
2 series · 16 of 30 positions shown, 20 images · non-contrast
Comparison: 12/26/2007.

CLINICAL DATA: Syncope.  Hypertension, ventricular tachycardia.

CT HEAD WITHOUT CONTRAST
TECHNIQUE: Contiguous axial images were obtained from the base of
the skull through the vertex without contrast.

[Series 2: head w/o · axial · non-contrast · 0.49mm/px · z∈[+81,+226]mm · 13 of 35 slices shown, 17 images]
[im 3/35  brain]
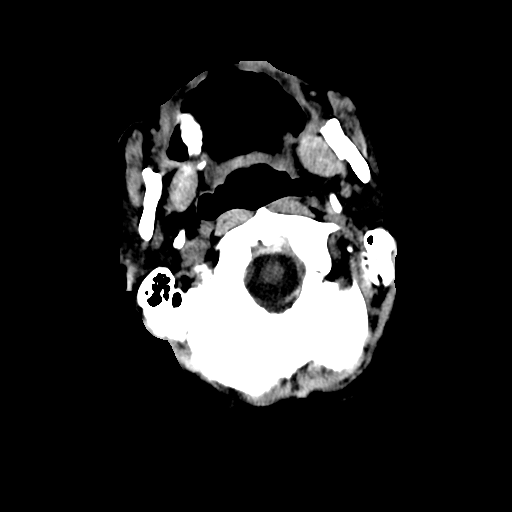
[im 3/35  bone]
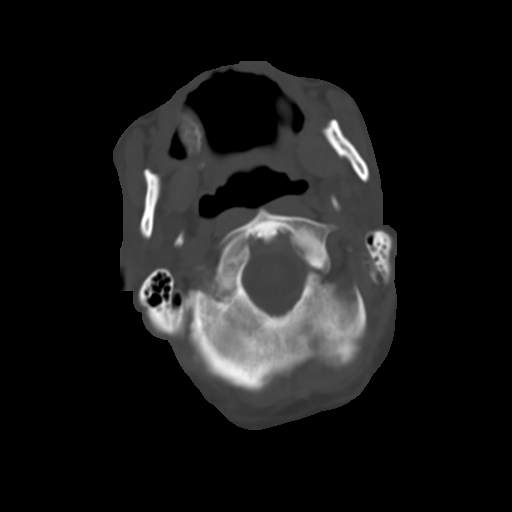
[im 5/35  brain]
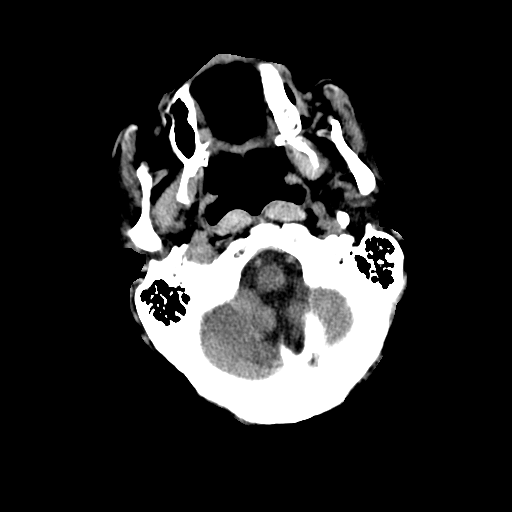
[im 8/35  brain]
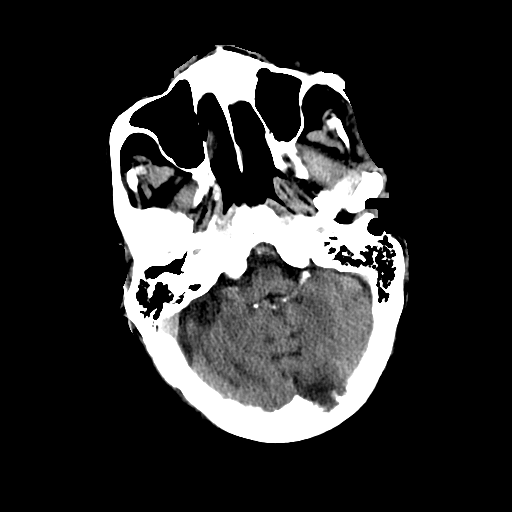
[im 10/35  brain]
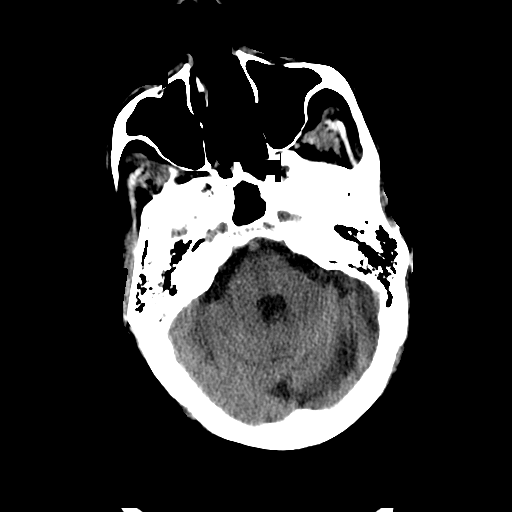
[im 13/35  brain]
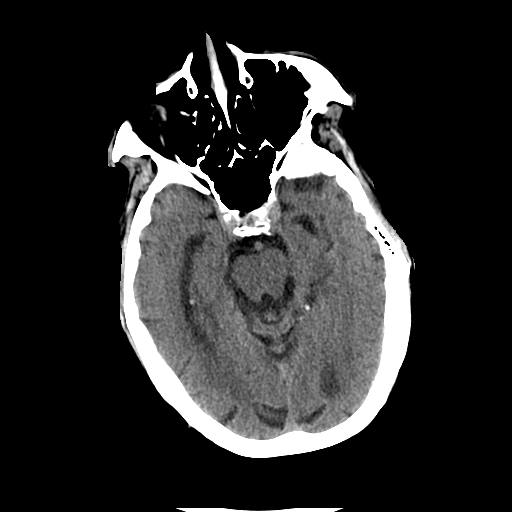
[im 13/35  bone]
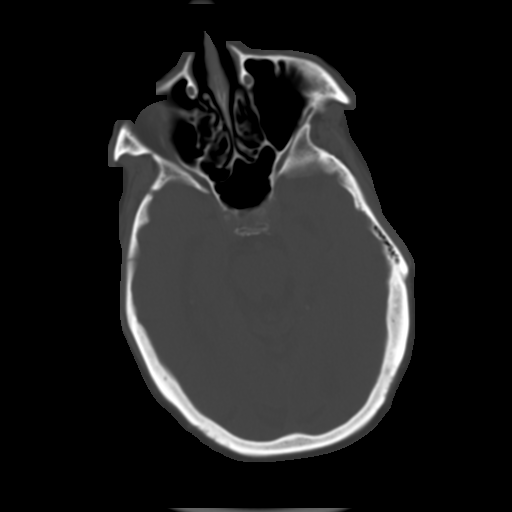
[im 15/35  brain]
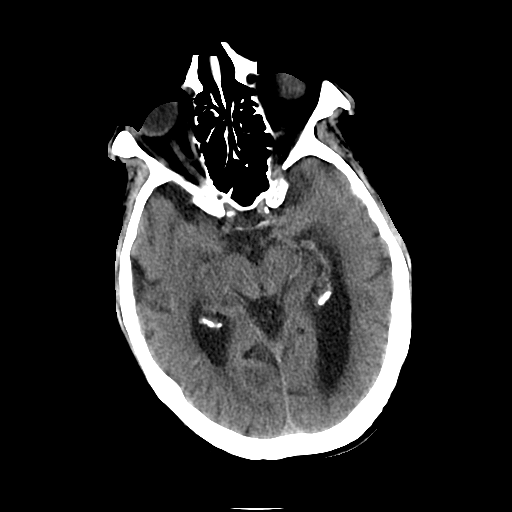
[im 18/35  brain]
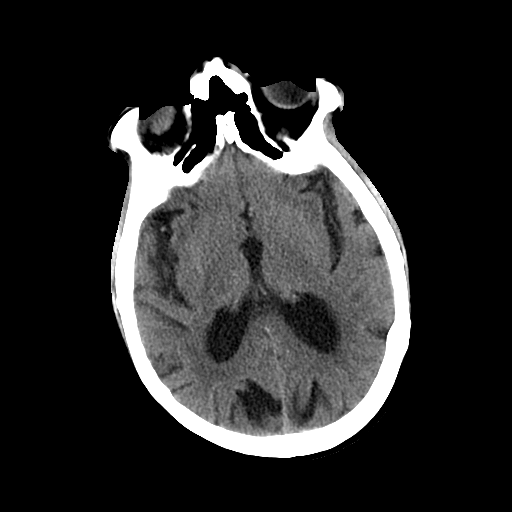
[im 20/35  brain]
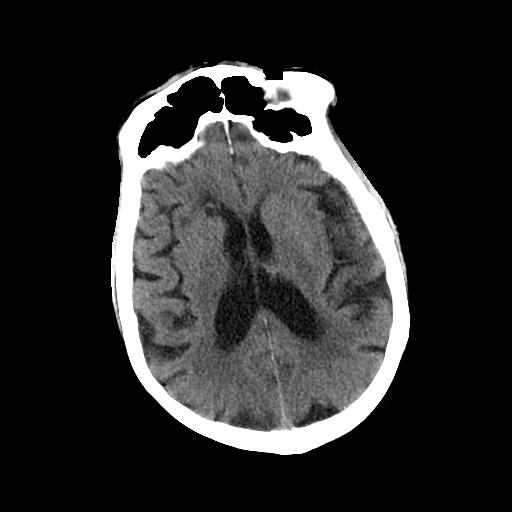
[im 22/35  brain]
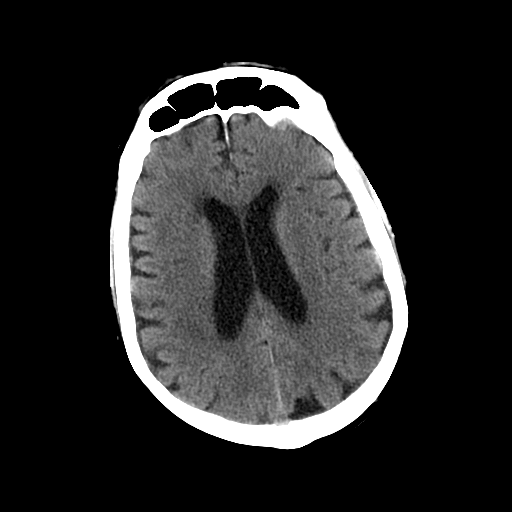
[im 22/35  bone]
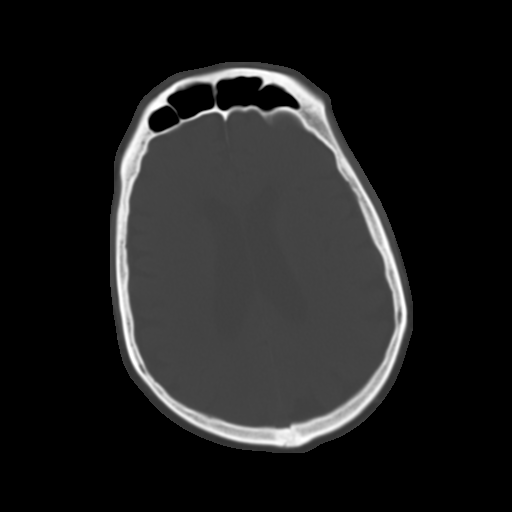
[im 25/35  brain]
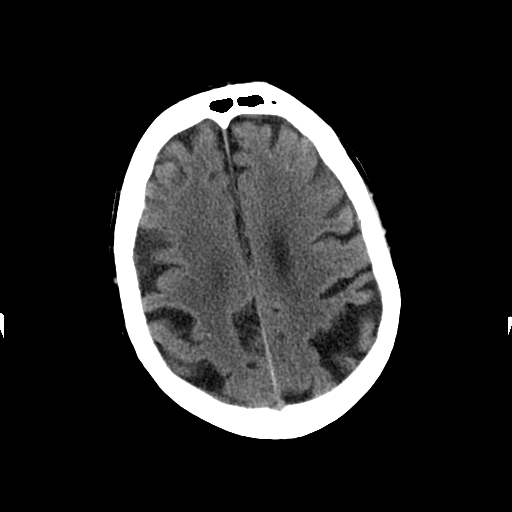
[im 27/35  brain]
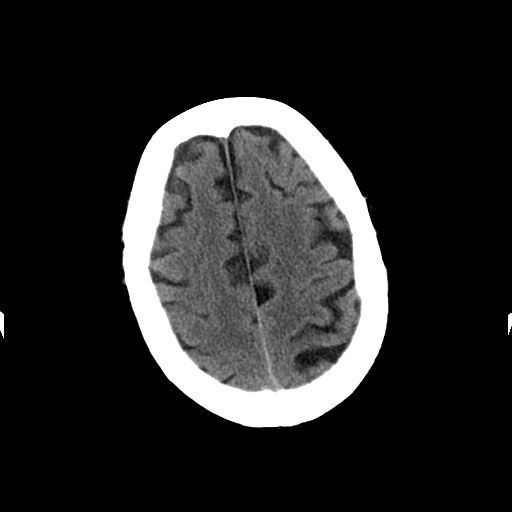
[im 30/35  brain]
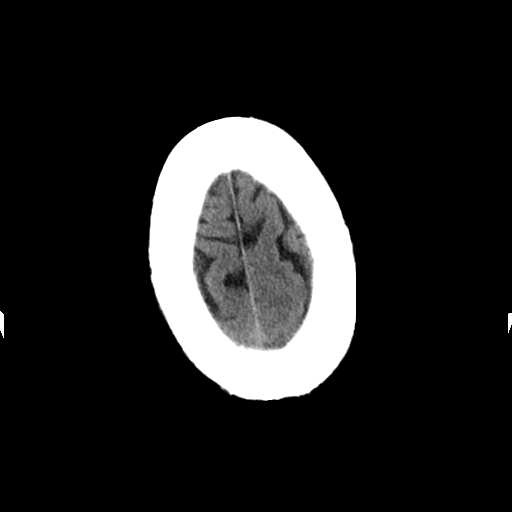
[im 32/35  brain]
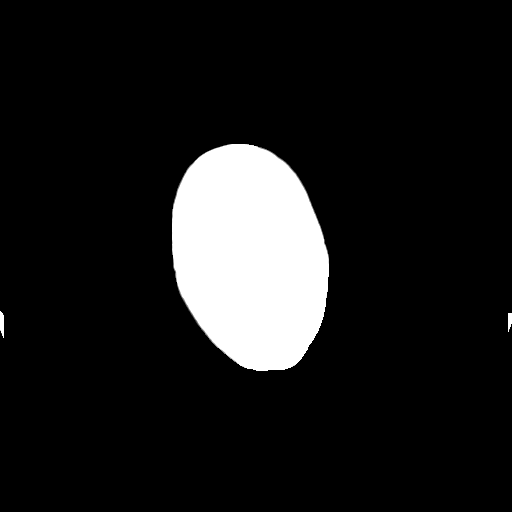
[im 32/35  bone]
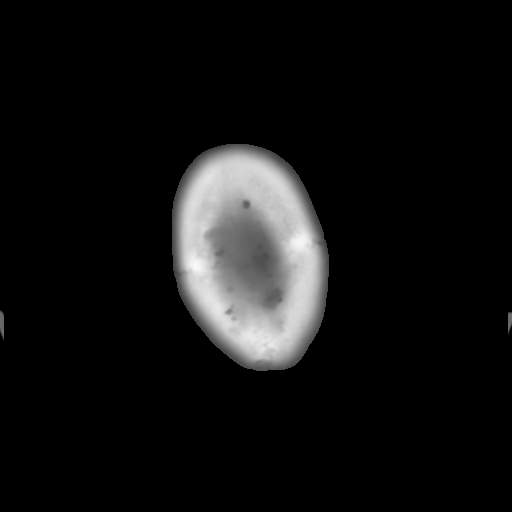

[Series 3: head w/o bone · axial · non-contrast · 0.49mm/px · z∈[+81,+131]mm · 3 of 35 slices shown]
[im 3/35  bone]
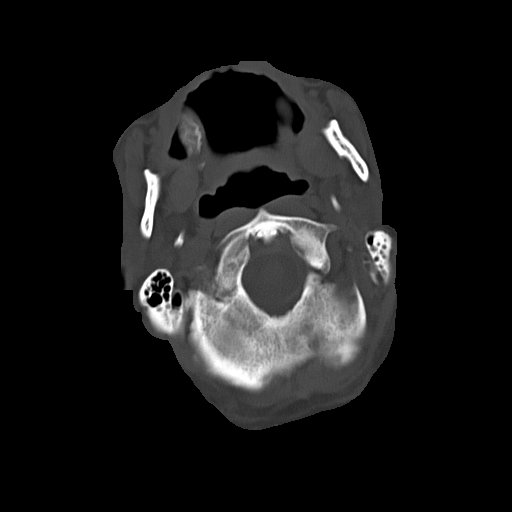
[im 8/35  bone]
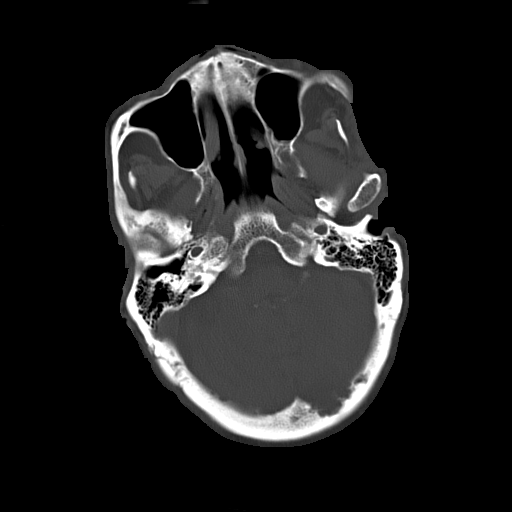
[im 13/35  bone]
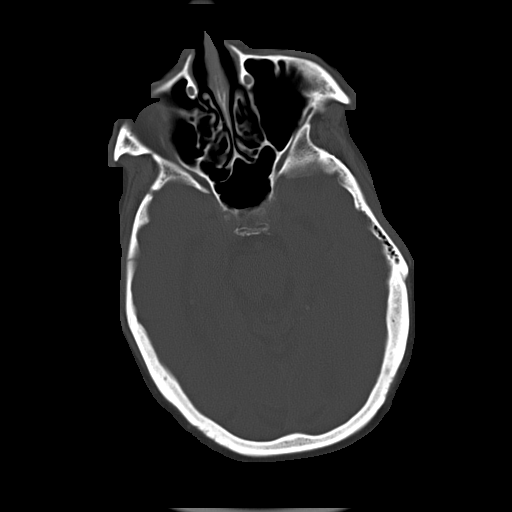

[16 of 30 positions shown; findings below may reference images not displayed]

FINDINGS: There is no evidence for acute infarction, intracranial
hemorrhage, mass lesion, hydrocephalus, or extra-axial fluid.
Moderate to severe atrophy is present.  Advanced chronic
microvascular ischemic change is present in the periventricular and
subcortical white matter.  Vascular calcification.  Calvarium
intact.  Clear sinus and mastoids.  Pacchionian granulations indent
the calvarium posteriorly, a normal variant.
IMPRESSION: Atrophy and small vessel disease.  No acute intracranial findings.

## 2012-07-25 ENCOUNTER — Telehealth: Payer: Self-pay | Admitting: Internal Medicine

## 2012-07-25 NOTE — Telephone Encounter (Signed)
Pt's wife would like results of blood work done 5/7. Pt would like to know if there is a generic for  (EXELON) 4.6 mg/24hr, ADVAIR DISKUS and SPIRIVA. Pls call on cell phone.

## 2012-07-25 NOTE — Telephone Encounter (Signed)
Spoke to pt's wife Irving Burton told her labs are okay, hemoglobin did come up. Also no generic for Inhalers but I can check for samples, and Exelon was ordered generic, pt put on hold and came back. [facility] do have sample of Advair but not Spiriva only coupon that you only pay $10.00. Irving Burton verbalized understanding and stated will come by tomorrow to pick up. Told her okay will be at the front desk for her.

## 2012-08-08 ENCOUNTER — Telehealth: Payer: Self-pay | Admitting: Internal Medicine

## 2012-08-08 NOTE — Telephone Encounter (Signed)
Pt is taking clonazePAM (KLONOPIN) 0.5 MG tablet, BID. Also getting divalproex (DEPAKOTE) 250 MG DR tablet, both at 8am.  Pt is calm during the day, but early evening (after dinner) pt is outspoken, not sleeping, disturbing others. Pt is very agitated, and causing issues w/ others example: (taking food out of their hand). Pt going room to room at night telling residence this is his house! Pt is causing quite a fuss after dark. If you could fax any med changes directly to the pharmacy, they would appreciate! Pls fax  [phone]

## 2012-08-08 NOTE — Telephone Encounter (Signed)
Changes Klonopin to 0.5 mg in the morning and increase to 1 mg late afternoon

## 2012-08-09 MED ORDER — CLONAZEPAM 0.5 MG PO TABS: ORAL_TABLET | ORAL | Status: DC

## 2012-08-09 NOTE — Telephone Encounter (Signed)
Spoke to [name] [name] told her I faxed new order for Klonopin to pharmacy as requested by Lupita Leash. Nicole Kindred verbalized understanding and she said she will contact the pharmacy to make sure they got it. Told her okay.

## 2012-08-22 ENCOUNTER — Telehealth: Payer: Self-pay | Admitting: Internal Medicine

## 2012-08-22 NOTE — Telephone Encounter (Signed)
ATC pt, line rang 10x with no answer and and no option to LM  Last ov w/ MR 7.8.14: Patient Instructions    Glad you are doing well  Continue spiriva and advair  REturn in 1 year for followup  Ensure flu shot in fall  Call or come sooner if needed

## 2012-08-23 NOTE — Telephone Encounter (Signed)
[email or web address]  

## 2012-08-24 NOTE — Telephone Encounter (Signed)
LMTCB

## 2012-08-24 NOTE — Telephone Encounter (Signed)
They can stop both spiriva and advair and instead in the nursing home they can give him albuterol/atrovent neb q6h (or 4 times daily) + albuterol neb as needed q2h prn. Will need neb machine. Typically through medicare B this is much chepaer. Please order this for them and see if pricing works better for them  Thanks  Dr. Kalman Shan, M.D., [facility].C.P Pulmonary and Critical Care Medicine Staff Physician [facility] [place] [facility] Pager: [phone], If no answer or between  15:00h - 7:00h: call [phone]  08/24/2012 9:55 AM

## 2012-08-24 NOTE — Telephone Encounter (Signed)
Spoke with Irving Burton, pt's spouse  She states that the pt is in NH now His ins went up and all of his meds cost over 500 $ per month Will the NH cost and meds cost they are struggling a lot with this She would like to know if he can stop his advair and spiriva, or maybe just one of these meds to help  His breathing has been doing well, and nurse who takes care of him never notices having trouble breathing  Please advise thanks!

## 2012-08-25 ENCOUNTER — Telehealth: Payer: Self-pay | Admitting: Internal Medicine

## 2012-08-25 NOTE — Telephone Encounter (Signed)
Spoke with patients spouse Irving Burton-- Informed her medication changes, spouse verbalized understanding of these.  Spoke with Kara Mead [facility]) at [facility] [place] [phone] Informed her of medication changes per MR Orders faxed to [facility] @ [phone]  Script placed in MR folder to be signed for scanning.  Nothing further needed at this time.

## 2012-08-25 NOTE — Telephone Encounter (Signed)
Called and spoke with pts spouse and she is aware that we will get these forms refaxed to facility.  MR  Do you have these forms?  thanks

## 2012-08-30 MED ORDER — IPRATROPIUM-ALBUTEROL 0.5-2.5 (3) MG/3ML IN SOLN: 3.0000 mL | Freq: Four times a day (QID) | RESPIRATORY_TRACT | Status: DC | PRN

## 2012-08-30 MED ORDER — ALBUTEROL SULFATE 0.63 MG/3ML IN NEBU: 1.0000 | INHALATION_SOLUTION | RESPIRATORY_TRACT | Status: DC | PRN

## 2012-08-30 NOTE — Telephone Encounter (Signed)
I spoke with [name] [name]. They never received orders on pt. She needs this re faxed to her at [phone]. Also they will need RX for pt nebs sent to the pharm at [phone].   I found fax orders and re faxed this over. I advised her MR is not back until Tuesday to sign rx's and then will fax this over to pharmacy. She voiced her understanding. RX printed out and placed in MR look at.

## 2012-08-30 NOTE — Telephone Encounter (Signed)
Nurse Kara Mead @ facility says pt's spouse is there now and nurse requests that someone call back "asap" while spouse is there now. [phone]. [name] [name]

## 2012-09-01 ENCOUNTER — Telehealth: Payer: Self-pay | Admitting: Internal Medicine

## 2012-09-01 NOTE — Telephone Encounter (Signed)
°  Called patient and left messages x3. Sent letter 09/01/12 °

## 2012-09-05 NOTE — Telephone Encounter (Signed)
Rx has been faxed to [phone] and original placed in MRs scan folder

## 2012-09-21 ENCOUNTER — Telehealth: Payer: Self-pay | Admitting: Internal Medicine

## 2012-09-21 NOTE — Telephone Encounter (Signed)
Called Josh at [employer] and left message to call me.

## 2012-09-21 NOTE — Telephone Encounter (Signed)
[name] [name]/[facility] needs Diagnosis Code for July 3 lab appt. Pt: CBC w/ diff, CMP. Lipid panel, valproic acid

## 2012-09-22 NOTE — Telephone Encounter (Signed)
Called Josh at [employer] and left message. Dx is Anemia, Hyperlipidemia, CAD.

## 2012-09-25 NOTE — Telephone Encounter (Signed)
Left detailed message for Josh on personal voicemail use Dx codes Anemia, Hyperlipidemia and CAD.

## 2012-09-27 NOTE — Telephone Encounter (Signed)
Left detailed message checking to make sure got my message the other day regarding pt.

## 2012-10-02 ENCOUNTER — Encounter: Payer: Self-pay | Admitting: Internal Medicine

## 2012-10-02 ENCOUNTER — Ambulatory Visit (INDEPENDENT_AMBULATORY_CARE_PROVIDER_SITE_OTHER): Payer: Medicare Other | Admitting: Internal Medicine

## 2012-10-02 VITALS — BP 120/80 | HR 65 | Temp 98.1°F | Ht 72.0 in | Wt 187.4 lb

## 2012-10-02 DIAGNOSIS — J449 Chronic obstructive pulmonary disease, unspecified: Secondary | ICD-10-CM

## 2012-10-02 NOTE — Patient Instructions (Addendum)
Copd stable Continue your nebulizers Your doctors should be able to take care of you for this lung problem Return as needed 

## 2012-10-02 NOTE — Progress Notes (Deleted)
Pt unable to complete properly due to dementia

## 2012-10-02 NOTE — Progress Notes (Signed)
Subjective:    Patient ID: [name] [name], male    DOB: 1926-02-03, 79 y.o.   MRN: [id number]  HPI #SMokker  - 71 pack smoker. REportedly quit 1992 but seen with cigarette pack in pocket 09/20/2011  #COPD  - CXR Nov 2012 - hyperinflated c/w copd  - Walking desaturation test 06/01/2011 185 feeet x 3 laps: did not desaturate  - Spirometry today fev1 2L/60%, Ratio 63 c/w Gold stage 2 copd   #Dyspnea  - Worse since insertion of pacemaker in summer 2012 for syncope (Dr Graciela Husbands, Sick sinus presumably).  - more of a complaint for wife than for him; class 2 ([employer], car to office) - dementia  - Pacer ok per Dr Graciela Husbands - May 2013 - refuses rehab - CAT score 01 October 2011 with spiriva and advair     OV 09/20/2011 Last visit 07/13/11. Followup coipd and multifactorial dyspnea. Here to report progress. Dr Graciela Husbands tells me his pacer is ok. WE are struggling to convince him about bnefit of rehab. Today he states is better. HE also states he never had a problem with dyspnea. He is not sure why he has to see me. Wife says that after spiriva and advair; dyspnea is likely better because he is out of the home more this summer but he says this is because it stopped snowing. He again sees no benefit from rehab. There are not new complaints. COPD CAT score 19 and reflects moderate disease burden.   REC lad you are doing well  Continue spiriva and advair  REturn in 1 year for followup  Ensure flu shot in fall  Call or come sooner if needed   OV 10/02/2012 Annual fu for copd. Here with daiughter. Dementia has advanced; now moderate. Is in [facility]. He has normla gait and normal bowel and can talk ful sentences but he has lost recall. Lacks executive function; could not do CAT score. Lives in [place] geen memory care but he thinks he lives at home with wife. Does not remember beeing in this office a year ago. Not interested in followup. COPD stable and getting nebs from daughter. Because of dementia unable to do  COPD cat score She wants copd care at [facility]   CAT COPD Symptom and Quality of Life Score ([employer])  0 (no burden) to 5 (highest burden) 09/20/11  Never Cough -> Cough all the time 2  No phlegm in chest -> Chest is full of phlegm 3  No chest tightness -> Chest feels very tight 2  No dyspnea for 1 flight stairs/hill -> Very dyspneic for 1 flight of stairs 2  No limitations for ADL at home -> Very limited with ADL at home 4  Confident leaving home -> Not at all confident leaving home 1  Sleep soundly -> Do not sleep soundly because of lung condition 1  Lots of Energy -> No energy at all 4  TOTAL Score (max 40)  19    Current outpatient prescriptions:albuterol (ACCUNEB) 0.63 MG/3ML nebulizer solution, Take 3 mLs (0.63 mg total) by nebulization every 2 (two) hours as needed for wheezing., Disp: 720 mL, Rfl: 12;  aspirin EC 81 MG tablet, Take 81 mg by mouth daily.  , Disp: , Rfl: ;  clonazePAM (KLONOPIN) 0.5 MG tablet, TAKE ONE-HALF TABLET BY MOUTH EVERY MORNING AND ONE TABLET BY MOUTH LATE AFTERNOON, Disp: 90 tablet, Rfl: 1 clopidogrel (PLAVIX) 75 MG tablet, Take 1 tablet (75 mg total) by mouth daily., Disp:  90 tablet, Rfl: 3;  divalproex (DEPAKOTE) 250 MG DR tablet, TAKE ONE TABLET BY MOUTH EVERY DAY, Disp: 90 tablet, Rfl: 1;  ipratropium-albuterol (DUONEB) 0.5-2.5 (3) MG/3ML SOLN, Take 3 mLs by nebulization every 6 (six) hours as needed., Disp: 360 mL, Rfl: 12 metoprolol tartrate (LOPRESSOR) 25 MG tablet, Take 1 tablet (25 mg total) by mouth 2 (two) times daily., Disp: 180 tablet, Rfl: 3;  pravastatin (PRAVACHOL) 40 MG tablet, Take 40 mg by mouth daily. , Disp: , Rfl: ;  QUEtiapine (SEROQUEL) 25 MG tablet, Take 25 mg by mouth 2 (two) times daily., Disp: , Rfl: ;  ranitidine (ZANTAC) 150 MG tablet, Take 150 mg by mouth at bedtime.  , Disp: , Rfl:     Review of Systems  Constitutional: Negative for fever and unexpected weight change.  HENT: Negative for ear pain,  nosebleeds, congestion, sore throat, rhinorrhea, sneezing, trouble swallowing, dental problem, postnasal drip and sinus pressure.   Eyes: Negative for redness and itching.  Respiratory: Negative for cough, chest tightness, shortness of breath and wheezing.   Cardiovascular: Negative for palpitations and leg swelling.  Gastrointestinal: Negative for nausea and vomiting.  Genitourinary: Negative for dysuria.  Musculoskeletal: Negative for joint swelling.  Skin: Negative for rash.  Neurological: Negative for headaches.  Hematological: Does not bruise/bleed easily.  Psychiatric/Behavioral: Negative for dysphoric mood. The patient is not nervous/anxious.        Objective:   Physical Exam Nursing note and vitals reviewed. Constitutional: He is oriented to person, place, and time. He appears well-developed and well-nourished. No distress.       Looks stated age  HENT:  Head: Normocephalic and atraumatic.  Right Ear: External ear normal.  Left Ear: External ear normal.  Mouth/Throat: Oropharynx is clear and moist. No oropharyngeal exudate.  Eyes: Conjunctivae and EOM are normal. Pupils are equal, round, and reactive to light. Right eye exhibits no discharge. Left eye exhibits no discharge. No scleral icterus.  Neck: Normal range of motion. Neck supple. No JVD present. No tracheal deviation present. No thyromegaly present.  Cardiovascular: Normal rate, regular rhythm and intact distal pulses.  Exam reveals no gallop and no friction rub.   No murmur heard. Pulmonary/Chest: Effort normal and breath sounds normal. No respiratory distress. He has no wheezes. He has no rales. He exhibits no tenderness.       Mild kyphosis +  Abdominal: Soft. Bowel sounds are normal. He exhibits no distension and no mass. There is no tenderness. There is no rebound and no guarding.  Musculoskeletal: Normal range of motion. He exhibits no edema and no tenderness.  Lymphadenopathy:    He has no cervical adenopathy.   Neurological: He is alert and oriented to person, place, and time. He has normal reflexes. No cranial nerve deficit. Coordination normal.    Moderate dementia.  Skin: Skin is warm and dry. No rash noted. He is not diaphoretic. No erythema. No pallor.  Psychiatric: He has a normal mood and affect. His behavior is normal. Judgment and thought content normal.           Assessment & Plan:

## 2012-10-03 ENCOUNTER — Encounter: Payer: Self-pay | Admitting: Internal Medicine

## 2012-10-12 NOTE — Assessment & Plan Note (Signed)
Copd stable Continue your nebulizers Your doctors should be able to take care of you for this lung problem Return as needed

## 2012-11-20 ENCOUNTER — Ambulatory Visit (INDEPENDENT_AMBULATORY_CARE_PROVIDER_SITE_OTHER): Payer: Medicare Other | Admitting: Internal Medicine

## 2012-11-20 ENCOUNTER — Encounter: Payer: Self-pay | Admitting: Internal Medicine

## 2012-11-20 VITALS — BP 120/80 | HR 67 | Temp 98.2°F | Resp 20 | Wt 189.0 lb

## 2012-11-20 DIAGNOSIS — I10 Essential (primary) hypertension: Secondary | ICD-10-CM

## 2012-11-20 DIAGNOSIS — R413 Other amnesia: Secondary | ICD-10-CM

## 2012-11-20 DIAGNOSIS — E785 Hyperlipidemia, unspecified: Secondary | ICD-10-CM

## 2012-11-20 DIAGNOSIS — Z950 Presence of cardiac pacemaker: Secondary | ICD-10-CM

## 2012-11-20 DIAGNOSIS — Z23 Encounter for immunization: Secondary | ICD-10-CM

## 2012-11-20 DIAGNOSIS — J4489 Other specified chronic obstructive pulmonary disease: Secondary | ICD-10-CM

## 2012-11-20 DIAGNOSIS — I2510 Atherosclerotic heart disease of native coronary artery without angina pectoris: Secondary | ICD-10-CM

## 2012-11-20 DIAGNOSIS — J449 Chronic obstructive pulmonary disease, unspecified: Secondary | ICD-10-CM

## 2012-11-20 NOTE — Progress Notes (Signed)
Subjective:    Patient ID: [name] [name], male    DOB: Aug 11, 1925, 79 y.o.   MRN: [id number]  HPI  79 year old patient who is seen today for general followup. He has a history of coronary artery disease status post permanent pacemaker insertion. He remains asymptomatic. No chest pain. He also has a history of moderate COPD which has been stable. He resides at a nursing home facility and he refuses nebulizer treatments. His pulmonary status has been stable. He has a history of moderate dementia dyslipidemia.  Nursing  home forms completed  Past Medical History  Diagnosis Date  . Pacemaker 08/09/2006    family denies this history  . Complete heart block 11/21/2008    intermittent  . BENIGN PROSTATIC HYPERTROPHY 08/09/2006  . CEREBROVASCULAR ACCIDENT, HX OF 08/09/2006  . COLONIC POLYPS, HX OF 08/09/2006  . COPD 01/13/2009  . CORONARY ARTERY DISEASE 01/12/2008    Drug eluting stent Circumflex 2009;  LHC 02/08/11:  CFX stent patent, LVF normal, left ventricle "spade-shaped" consistent with hypertensive heart disease  . GERD 08/09/2006  . HYPERLIPIDEMIA 08/09/2006  . HYPERTENSION, BENIGN 08/13/2008  . MELENA, HX OF 02/01/2008  . PHLEBITIS, LOWER EXTREMITY 06/08/2007  . Polymyalgia rheumatica 08/09/2006  . SYNCOPE 12/25/2008    Secondary heart block  . VENTRICULAR TACHYCARDIA 01/12/2008    Nonsustained  . WEAKNESS 11/21/2008  . Complication of anesthesia 02/08/11    "? 1966; I think he just quit breathing after Demerol"  . Pacemaker     DOI 2012  . Pneumonia 02/08/11    "walking pneumonia; several years ago"  . Whooping cough     "as a child"  . Cancer   . Arthritis   . DEMENTIA   . Atrioventricular block, complete     History   Social History  . Marital Status: Married    Spouse Name: N/A    Number of Children: N/A  . Years of Education: N/A   Occupational History  . retired    Social History Main Topics  . Smoking status: Former Smoker -- 1.00 packs/day for 71 years    Types:  Cigarettes, Cigars    Quit date: 03/16/1991  . Smokeless tobacco: Never Used     Comment: Pt is smoking 2 cigars a day.  . Alcohol Use: Yes     Comment: "used to have glass of wine or beer occasionally; hasn't had one since ~ 2002"  . Drug Use: No  . Sexual Activity: No   Other Topics Concern  . Not on file   Social History Narrative  . No narrative on file    Past Surgical History  Procedure Laterality Date  . Lumbar laminectomy    . Transurethral resection of prostate    . Coronary angioplasty with stent placement    . Cataract extraction w/ intraocular lens  implant, bilateral  ~ 2000  . Back surgery    . Insert / replace / remove pacemaker  09/07/10    initial placement  . Carotid endarterectomy      left    Family History  Problem Relation Age of Onset  . Heart disease Father     Allergies  Allergen Reactions  . Atorvastatin Other (See Comments)    Unknown.  . Demerol [Meperidine]   . Meperidine Hcl Other (See Comments)    Unknown Patient wife states it "knocked him out" years ago    Current Outpatient Prescriptions on File Prior to Visit  Medication Sig Dispense Refill  .  albuterol (ACCUNEB) 0.63 MG/3ML nebulizer solution Take 3 mLs (0.63 mg total) by nebulization every 2 (two) hours as needed for wheezing.  720 mL  12  . aspirin EC 81 MG tablet Take 81 mg by mouth daily.        . clonazePAM (KLONOPIN) 0.5 MG tablet TAKE ONE-HALF TABLET BY MOUTH EVERY MORNING AND ONE TABLET BY MOUTH LATE AFTERNOON  90 tablet  1  . clopidogrel (PLAVIX) 75 MG tablet Take 1 tablet (75 mg total) by mouth daily.  90 tablet  3  . divalproex (DEPAKOTE) 250 MG DR tablet TAKE ONE TABLET BY MOUTH EVERY DAY  90 tablet  1  . ipratropium-albuterol (DUONEB) 0.5-2.5 (3) MG/3ML SOLN Take 3 mLs by nebulization every 6 (six) hours as needed.  360 mL  12  . metoprolol tartrate (LOPRESSOR) 25 MG tablet Take 1 tablet (25 mg total) by mouth 2 (two) times daily.  180 tablet  3  . pravastatin  (PRAVACHOL) 40 MG tablet Take 40 mg by mouth daily.       . QUEtiapine (SEROQUEL) 25 MG tablet Take 25 mg by mouth 2 (two) times daily.      . ranitidine (ZANTAC) 150 MG tablet Take 150 mg by mouth at bedtime.         No current facility-administered medications on file prior to visit.    BP 120/80  Pulse 67  Temp(Src) 98.2 F (36.8 C) (Oral)  Resp 20  Wt 189 lb (85.73 kg)  BMI 25.63 kg/m2  SpO2 97%     Review of Systems  Constitutional: Negative for fever, chills, appetite change and fatigue.  HENT: Negative for hearing loss, ear pain, congestion, sore throat, trouble swallowing, neck stiffness, dental problem, voice change and tinnitus.   Eyes: Negative for pain, discharge and visual disturbance.  Respiratory: Negative for cough, chest tightness, wheezing and stridor.   Cardiovascular: Negative for chest pain, palpitations and leg swelling.  Gastrointestinal: Negative for nausea, vomiting, abdominal pain, diarrhea, constipation, blood in stool and abdominal distention.  Genitourinary: Negative for urgency, hematuria, flank pain, discharge, difficulty urinating and genital sores.  Musculoskeletal: Negative for myalgias, back pain, joint swelling, arthralgias and gait problem.  Skin: Negative for rash.  Neurological: Negative for dizziness, syncope, speech difficulty, weakness, numbness and headaches.  Hematological: Negative for adenopathy. Does not bruise/bleed easily.  Psychiatric/Behavioral: Positive for behavioral problems, confusion, decreased concentration and agitation. Negative for dysphoric mood. The patient is not nervous/anxious.        Objective:   Physical Exam  Constitutional: He is oriented to person, place, and time. He appears well-developed.  HENT:  Head: Normocephalic.  Right Ear: External ear normal.  Left Ear: External ear normal.  Eyes: Conjunctivae and EOM are normal.  Neck: Normal range of motion.  Cardiovascular: Normal rate and normal heart  sounds.   Pulmonary/Chest: Effort normal.  O2 saturation 97  Decreased breath sounds but clear  Abdominal: Bowel sounds are normal.  Musculoskeletal: Normal range of motion. He exhibits no edema and no tenderness.  Neurological: He is alert and oriented to person, place, and time.  Psychiatric: He has a normal mood and affect. His behavior is normal.          Assessment & Plan:    Coronary artery disease. Status post pacemaker insertion Dementia COPD. Nebulizer treatments every 6 hours will be discontinued but will be maintained when necessary shortness of breath Dyslipidemia. Continue pravastatin

## 2012-11-20 NOTE — Patient Instructions (Signed)
Return in 6 months for follow up

## 2012-11-22 ENCOUNTER — Encounter: Payer: Self-pay | Admitting: Cardiovascular Disease

## 2012-11-22 ENCOUNTER — Ambulatory Visit (INDEPENDENT_AMBULATORY_CARE_PROVIDER_SITE_OTHER): Payer: Medicare Other | Admitting: Cardiovascular Disease

## 2012-11-22 VITALS — BP 130/70 | HR 60 | Ht 72.0 in | Wt 187.0 lb

## 2012-11-22 DIAGNOSIS — I2510 Atherosclerotic heart disease of native coronary artery without angina pectoris: Secondary | ICD-10-CM

## 2012-11-22 DIAGNOSIS — I10 Essential (primary) hypertension: Secondary | ICD-10-CM

## 2012-11-22 DIAGNOSIS — I442 Atrioventricular block, complete: Secondary | ICD-10-CM

## 2012-11-22 NOTE — Patient Instructions (Addendum)
Your physician wants you to follow-up in:  12 months.  You will receive a reminder letter in the mail two months in advance. If you don't receive a letter, please call our office to schedule the follow-up appointment.   

## 2012-11-22 NOTE — Progress Notes (Signed)
History of Present Illness: [name] [name] is 79 yo WM with history of CAD, complete heart block s/p PPM, CVA, HLD, COPD, dementia, HTN, former tobacco abuse here today for cardiac follow up. He has been followed in the past by Dr. Juanda Chance. In 2009 he had a syncopal episode with documented ventricular tachycardia and underwent PCI of the circumflex artery with a drug-eluting stent. His pacemaker is followed by Dr. Graciela Husbands. Stress myoview October 2011 with no evidence of ischemia. He is not on a beta blocker secondary to intolerance in the past (syncope,weakness). He was admitted to [facility] 11/26-11/27/12 with complaints of weakness and diaphoresis. This was similar to his previous anginal equivalent and left heart catheterization was recommended. LHC 02/08/11: CFX stent patent, LVF normal, left ventricle "spade-shaped" consistent with hypertensive heart disease. It was felt that his hypertension was the cause of his symptoms and his blood pressure medications were adjusted.   He is here today for follow up. He denies any chest pain, SOB or palpitations. No dizziness, near syncope or syncope. He is retired from [employer]. He is taking all meds without problems.   Primary Care Physician: Eleonore Chiquito  Last Lipid Profile:Lipid Panel     Component Value Date/Time   CHOL 150 04/12/2011 0847   TRIG 61.0 04/12/2011 0847   HDL 53.90 04/12/2011 0847   CHOLHDL 3 04/12/2011 0847   VLDL 12.2 04/12/2011 0847   LDLCALC 84 04/12/2011 0847     Past Medical History  Diagnosis Date  . Pacemaker 08/09/2006    family denies this history  . Complete heart block 11/21/2008    intermittent  . BENIGN PROSTATIC HYPERTROPHY 08/09/2006  . CEREBROVASCULAR ACCIDENT, HX OF 08/09/2006  . COLONIC POLYPS, HX OF 08/09/2006  . COPD 01/13/2009  . CORONARY ARTERY DISEASE 01/12/2008    Drug eluting stent Circumflex 2009;  LHC 02/08/11:  CFX stent patent, LVF normal, left ventricle "spade-shaped" consistent with hypertensive  heart disease  . GERD 08/09/2006  . HYPERLIPIDEMIA 08/09/2006  . HYPERTENSION, BENIGN 08/13/2008  . MELENA, HX OF 02/01/2008  . PHLEBITIS, LOWER EXTREMITY 06/08/2007  . Polymyalgia rheumatica 08/09/2006  . SYNCOPE 12/25/2008    Secondary heart block  . VENTRICULAR TACHYCARDIA 01/12/2008    Nonsustained  . WEAKNESS 11/21/2008  . Complication of anesthesia 02/08/11    "? 1966; I think he just quit breathing after Demerol"  . Pacemaker     DOI 2012  . Pneumonia 02/08/11    "walking pneumonia; several years ago"  . Whooping cough     "as a child"  . Cancer   . Arthritis   . DEMENTIA   . Atrioventricular block, complete     Past Surgical History  Procedure Laterality Date  . Lumbar laminectomy    . Transurethral resection of prostate    . Coronary angioplasty with stent placement    . Cataract extraction w/ intraocular lens  implant, bilateral  ~ 2000  . Back surgery    . Insert / replace / remove pacemaker  09/07/10    initial placement  . Carotid endarterectomy      left    Current Outpatient Prescriptions  Medication Sig Dispense Refill  . albuterol (ACCUNEB) 0.63 MG/3ML nebulizer solution Take 3 mLs (0.63 mg total) by nebulization every 2 (two) hours as needed for wheezing.  720 mL  12  . aspirin EC 81 MG tablet Take 81 mg by mouth daily.        . clonazePAM (KLONOPIN) 0.5 MG tablet  TAKE ONE-HALF TABLET BY MOUTH EVERY MORNING AND ONE TABLET BY MOUTH LATE AFTERNOON  90 tablet  1  . clopidogrel (PLAVIX) 75 MG tablet Take 1 tablet (75 mg total) by mouth daily.  90 tablet  3  . divalproex (DEPAKOTE) 250 MG DR tablet TAKE ONE TABLET BY MOUTH EVERY DAY  90 tablet  1  . ipratropium-albuterol (DUONEB) 0.5-2.5 (3) MG/3ML SOLN Take 3 mLs by nebulization every 6 (six) hours as needed.  360 mL  12  . metoprolol tartrate (LOPRESSOR) 25 MG tablet Take 1 tablet (25 mg total) by mouth 2 (two) times daily.  180 tablet  3  . pravastatin (PRAVACHOL) 40 MG tablet Take 40 mg by mouth daily.       .  QUEtiapine (SEROQUEL) 25 MG tablet Take 25 mg by mouth 2 (two) times daily.      . ranitidine (ZANTAC) 150 MG tablet Take 150 mg by mouth at bedtime.         No current facility-administered medications for this visit.    Allergies  Allergen Reactions  . Atorvastatin Other (See Comments)    Unknown.  . Demerol [Meperidine]   . Meperidine Hcl Other (See Comments)    Unknown Patient wife states it "knocked him out" years ago    History   Social History  . Marital Status: Married    Spouse Name: N/A    Number of Children: N/A  . Years of Education: N/A   Occupational History  . retired    Social History Main Topics  . Smoking status: Former Smoker -- 1.00 packs/day for 71 years    Types: Cigarettes, Cigars    Quit date: 03/16/1991  . Smokeless tobacco: Never Used     Comment: Pt is smoking 2 cigars a day.  . Alcohol Use: Yes     Comment: "used to have glass of wine or beer occasionally; hasn't had one since ~ 2002"  . Drug Use: No  . Sexual Activity: No   Other Topics Concern  . Not on file   Social History Narrative  . No narrative on file    Family History  Problem Relation Age of Onset  . Heart disease Father     Review of Systems:  As stated in the HPI and otherwise negative.   BP 130/70  Pulse 60  Ht 6' (1.829 m)  Wt 187 lb (84.823 kg)  BMI 25.36 kg/m2  Physical Examination: General: Well developed, well nourished, NAD. Very pleasant HEENT: OP clear, mucus membranes moist SKIN: warm, dry. No rashes. Neuro: No focal deficits Musculoskeletal: Muscle strength 5/5 all ext Psychiatric: Mood and affect normal Neck: No JVD, no carotid bruits, no thyromegaly, no lymphadenopathy. Lungs:Clear bilaterally, no wheezes, rhonci, crackles Cardiovascular: Regular rate and rhythm. No murmurs, gallops or rubs. Abdomen:Soft. Bowel sounds present. Non-tender.  Extremities: No lower extremity edema. Pulses are trace  in the bilateral DP/PT.  Assessment and Plan:    1. CAD: Stable. He has progressive dementia. No c/o chest pain or SOB. Will continue medical management with ASA/Plavix/statin. My hope would be that we can manage him conservatively for the remainder of his life given his advancing dementia.   2. Atrioventricular block, complete-intermittent with pacemaker in place: Followed by Dr. Graciela Husbands in EP clinic. Stable. PPM in place.   3. HYPERTENSION: Controlled. No changes.

## 2012-12-12 IMAGING — CR DG CHEST 2V
2 series · 2 of 2 positions shown · non-contrast
Comparison: 09/09/2010 and  11/21/2008

CLINICAL DATA: Chest pain.  Near syncope.

CHEST - 2 VIEW

[w chest pa]
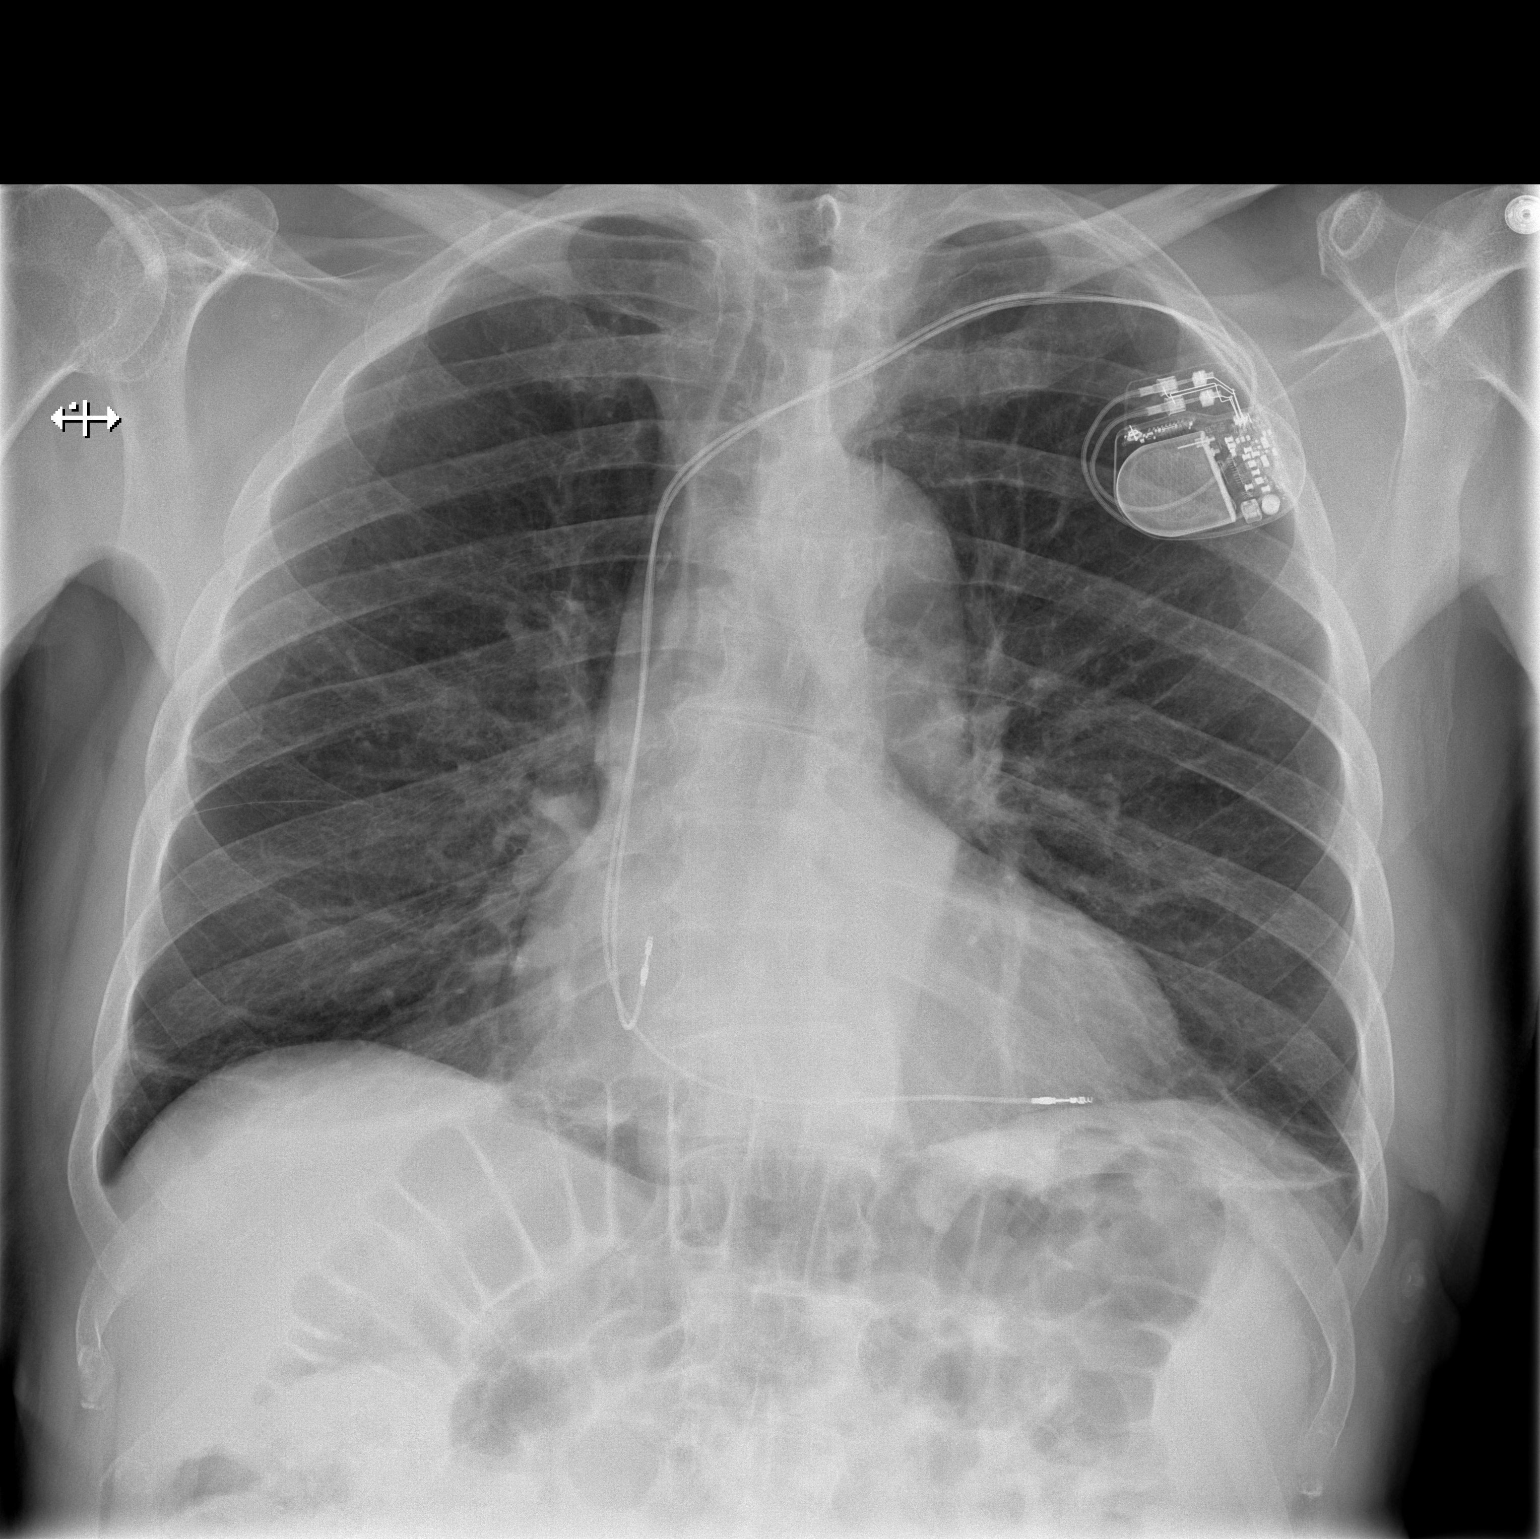

[w chest lat]
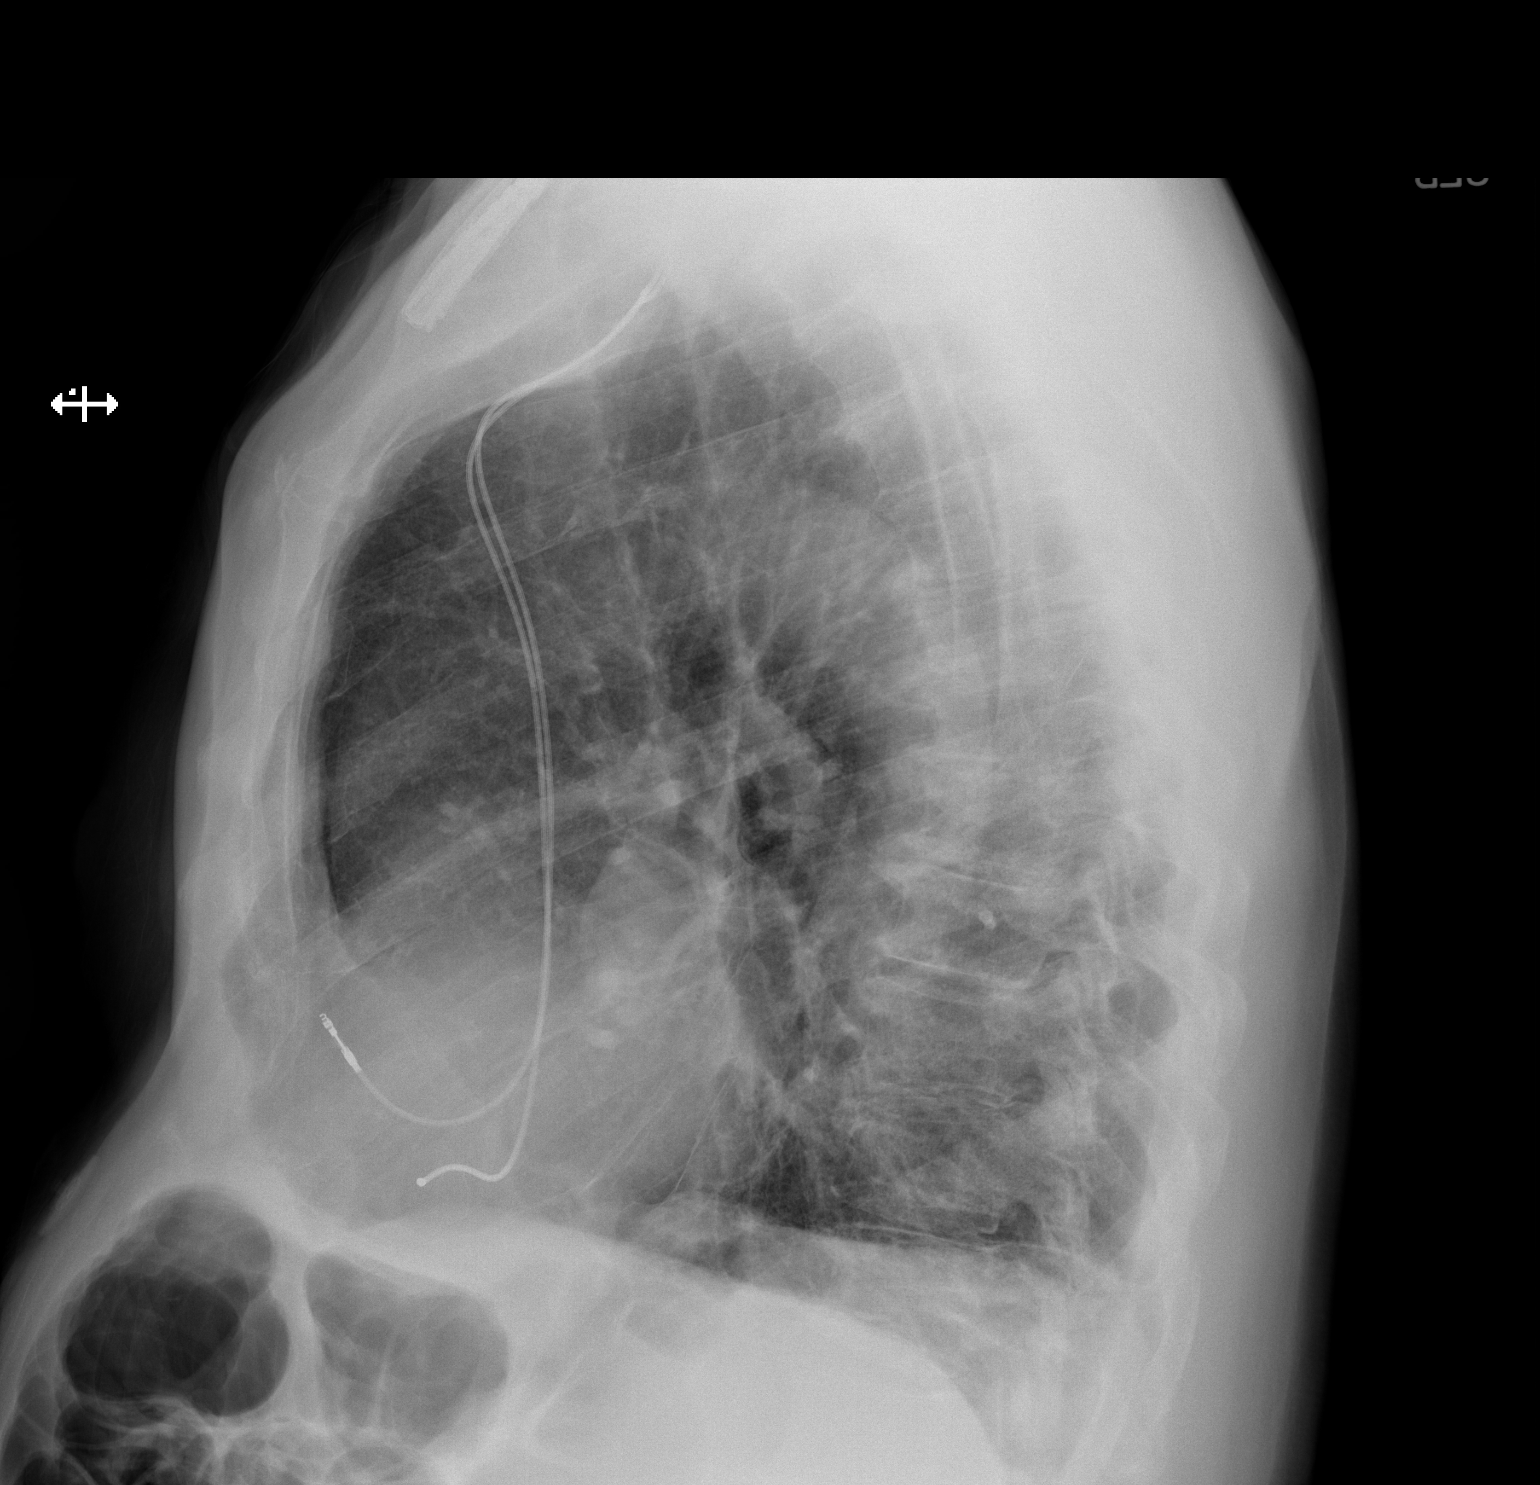

[2 of 2 positions shown; findings below may reference images not displayed]

FINDINGS: Heart size and vascularity are normal.  Dual lead pacer
in place.  Slight linear scarring at both lung bases.  The lungs
are hyperinflated consistent with emphysema.

No acute infiltrates or effusions.  No acute osseous abnormality.
IMPRESSION: No acute abnormalities.  Probable emphysema.

## 2012-12-27 ENCOUNTER — Ambulatory Visit (INDEPENDENT_AMBULATORY_CARE_PROVIDER_SITE_OTHER): Payer: Medicare Other | Admitting: Internal Medicine

## 2012-12-27 ENCOUNTER — Encounter: Payer: Self-pay | Admitting: Internal Medicine

## 2012-12-27 VITALS — BP 154/88 | HR 60 | Ht 72.0 in | Wt 188.6 lb

## 2012-12-27 DIAGNOSIS — I442 Atrioventricular block, complete: Secondary | ICD-10-CM

## 2012-12-27 DIAGNOSIS — I472 Ventricular tachycardia, unspecified: Secondary | ICD-10-CM

## 2012-12-27 DIAGNOSIS — Z950 Presence of cardiac pacemaker: Secondary | ICD-10-CM

## 2012-12-27 LAB — PACEMAKER DEVICE OBSERVATION
BAMS-0001: 150 {beats}/min
BAMS-0003: 70 {beats}/min
BATTERY VOLTAGE: 2.9629 V
DEVICE MODEL PM: 7241194

## 2012-12-27 NOTE — Patient Instructions (Signed)
Remote monitoring is used to monitor your Pacemaker of ICD from home. This monitoring reduces the number of office visits required to check your device to one time per year. It allows [place] to keep an eye on the functioning of your device to ensure it is working properly. You are scheduled for a device check from home on 03/29/13. You may send your transmission at any time that day. If you have a wireless device, the transmission will be sent automatically. After your physician reviews your transmission, you will receive a postcard with your next transmission date.  Your physician wants you to follow-up in: one year with Dr. Graciela Husbands.  You will receive a reminder letter in the mail two months in advance. If you don't receive a letter, please call our office to schedule the follow-up appointment.

## 2012-12-27 NOTE — Assessment & Plan Note (Signed)
The patient's device was interrogated.  The information was reviewed. No changes were made in the programming.    

## 2012-12-27 NOTE — Progress Notes (Signed)
Patient Care Team: [name], MD as PCP - General Kathleene Hazel, MD as Consulting Physician (Cardiology)   HPI  [name] [name] is a 79 y.o. male Seen in followup for intermittent complete heart block for which  he underwent pacing spring 2012.  He has a history of coronary artery disease with prior circumflex stenting. Echo March 2013 demonstrated normal left ventricular function without significant valvular issues.   Myoview October 2011 demonstrated no ischemia     The patient denies chest pain, shortness of breath, nocturnal dyspnea, orthopnea or peripheral edema. T  His memory continues to be an issue. His irritability remains an issue also. He has been moved [facility]  Past Medical History  Diagnosis Date  . Pacemaker 08/09/2006    family denies this history  . Complete heart block 11/21/2008    intermittent  . BENIGN PROSTATIC HYPERTROPHY 08/09/2006  . CEREBROVASCULAR ACCIDENT, HX OF 08/09/2006  . COLONIC POLYPS, HX OF 08/09/2006  . COPD 01/13/2009  . CORONARY ARTERY DISEASE 01/12/2008    Drug eluting stent Circumflex 2009;  LHC 02/08/11:  CFX stent patent, LVF normal, left ventricle "spade-shaped" consistent with hypertensive heart disease  . GERD 08/09/2006  . HYPERLIPIDEMIA 08/09/2006  . HYPERTENSION, BENIGN 08/13/2008  . MELENA, HX OF 02/01/2008  . PHLEBITIS, LOWER EXTREMITY 06/08/2007  . Polymyalgia rheumatica 08/09/2006  . SYNCOPE 12/25/2008    Secondary heart block  . VENTRICULAR TACHYCARDIA 01/12/2008    Nonsustained  . WEAKNESS 11/21/2008  . Complication of anesthesia 02/08/11    "? 1966; I think he just quit breathing after Demerol"  . Pacemaker     DOI 2012  . Pneumonia 02/08/11    "walking pneumonia; several years ago"  . Whooping cough     "as a child"  . Cancer   . Arthritis   . DEMENTIA   . Atrioventricular block, complete     Past Surgical History  Procedure Laterality Date  . Lumbar laminectomy    . Transurethral  resection of prostate    . Coronary angioplasty with stent placement    . Cataract extraction w/ intraocular lens  implant, bilateral  ~ 2000  . Back surgery    . Insert / replace / remove pacemaker  09/07/10    initial placement  . Carotid endarterectomy      left    Current Outpatient Prescriptions  Medication Sig Dispense Refill  . albuterol (ACCUNEB) 0.63 MG/3ML nebulizer solution Take 3 mLs (0.63 mg total) by nebulization every 2 (two) hours as needed for wheezing.  720 mL  12  . aspirin EC 81 MG tablet Take 81 mg by mouth daily.        . clonazePAM (KLONOPIN) 0.5 MG tablet TAKE ONE-HALF TABLET BY MOUTH EVERY MORNING AND ONE TABLET BY MOUTH LATE AFTERNOON  90 tablet  1  . clopidogrel (PLAVIX) 75 MG tablet Take 1 tablet (75 mg total) by mouth daily.  90 tablet  3  . divalproex (DEPAKOTE) 250 MG DR tablet TAKE ONE TABLET BY MOUTH EVERY DAY  90 tablet  1  . ipratropium-albuterol (DUONEB) 0.5-2.5 (3) MG/3ML SOLN Take 3 mLs by nebulization every 6 (six) hours as needed.  360 mL  12  . metoprolol tartrate (LOPRESSOR) 25 MG tablet Take 1 tablet (25 mg total) by mouth 2 (two) times daily.  180 tablet  3  . pravastatin (PRAVACHOL) 40 MG tablet Take 40 mg by mouth daily.       Marland Kitchen  QUEtiapine (SEROQUEL) 25 MG tablet Take 25 mg by mouth 2 (two) times daily.      . ranitidine (ZANTAC) 150 MG tablet Take 150 mg by mouth at bedtime.         No current facility-administered medications for this visit.    Allergies  Allergen Reactions  . Atorvastatin Other (See Comments)    Unknown.  . Demerol [Meperidine]   . Meperidine Hcl Other (See Comments)    Unknown Patient wife states it "knocked him out" years ago    Review of Systems negative except from HPI and PMH  Physical Exam BP 154/88  Pulse 60  Ht 6' (1.829 m)  Wt 188 lb 9.6 oz (85.548 kg)  BMI 25.57 kg/m2  Well developed and nourished in no acute distress HENT normal Neck supple with JVP-flat Clear Regular rate and rhythm, no  murmurs or gallops Abd-soft with active BS No Clubbing cyanosis edema Skin-warm and dry A & Oriented  Grossly normal sensory and motor function  ECG A apcing with narrow QRs  Assessment and  Plan

## 2012-12-27 NOTE — Assessment & Plan Note (Signed)
No intercurrent Ventricular tachycardia  

## 2012-12-27 NOTE — Assessment & Plan Note (Signed)
Stable post pacing 

## 2013-02-16 ENCOUNTER — Ambulatory Visit (INDEPENDENT_AMBULATORY_CARE_PROVIDER_SITE_OTHER): Payer: Medicare Other | Admitting: Internal Medicine

## 2013-02-16 ENCOUNTER — Encounter: Payer: Self-pay | Admitting: Internal Medicine

## 2013-02-16 ENCOUNTER — Telehealth: Payer: Self-pay | Admitting: Internal Medicine

## 2013-02-16 VITALS — BP 120/80 | HR 79 | Temp 98.0°F | Resp 20 | Wt 192.0 lb

## 2013-02-16 DIAGNOSIS — D649 Anemia, unspecified: Secondary | ICD-10-CM

## 2013-02-16 DIAGNOSIS — I6990 Unspecified sequelae of unspecified cerebrovascular disease: Secondary | ICD-10-CM

## 2013-02-16 DIAGNOSIS — I10 Essential (primary) hypertension: Secondary | ICD-10-CM

## 2013-02-16 DIAGNOSIS — I2510 Atherosclerotic heart disease of native coronary artery without angina pectoris: Secondary | ICD-10-CM

## 2013-02-16 LAB — CBC WITH DIFFERENTIAL/PLATELET
Eosinophils Relative: 2 % (ref 0–5)
HCT: 36.6 % — ABNORMAL LOW (ref 39.0–52.0)
Hemoglobin: 12.4 g/dL — ABNORMAL LOW (ref 13.0–17.0)
Lymphocytes Relative: 24 % (ref 12–46)
Lymphs Abs: 1.7 10*3/uL (ref 0.7–4.0)
MCH: 29.9 pg (ref 26.0–34.0)
MCHC: 33.9 g/dL (ref 30.0–36.0)
MCV: 88.2 fL (ref 78.0–100.0)
Monocytes Absolute: 1 10*3/uL (ref 0.1–1.0)
Monocytes Relative: 13 % — ABNORMAL HIGH (ref 3–12)
Neutrophils Relative %: 60 % (ref 43–77)
Platelets: 234 10*3/uL (ref 150–400)
RBC: 4.15 MIL/uL — ABNORMAL LOW (ref 4.22–5.81)
WBC: 7.2 10*3/uL (ref 4.0–10.5)

## 2013-02-16 LAB — COMPREHENSIVE METABOLIC PANEL
ALT: 8 U/L (ref 0–53)
Albumin: 3.5 g/dL (ref 3.5–5.2)
Alkaline Phosphatase: 44 U/L (ref 39–117)
CO2: 27 mEq/L (ref 19–32)
Calcium: 9 mg/dL (ref 8.4–10.5)
Chloride: 105 mEq/L (ref 96–112)
Creat: 0.94 mg/dL (ref 0.50–1.35)
Glucose, Bld: 93 mg/dL (ref 70–99)
Total Bilirubin: 0.5 mg/dL (ref 0.3–1.2)

## 2013-02-16 NOTE — Telephone Encounter (Signed)
Pt has appt sch for 315pm

## 2013-02-16 NOTE — Telephone Encounter (Signed)
Please advise 

## 2013-02-16 NOTE — Patient Instructions (Signed)
Use a walker at all times  Limit your sodium (Salt) intake  Return in 6 months for follow-up

## 2013-02-16 NOTE — Telephone Encounter (Signed)
Pt wife's calling to request a work in appt to be seen today due to pt having difficulty walking.  Wife states she spoke with triage nurse and was informed to call office this morning for an appt, however no appts are available with pcp.

## 2013-02-16 NOTE — Progress Notes (Signed)
Subjective:    Patient ID: [name] [name], male    DOB: 07/23/1925, 79 y.o.   MRN: [id number]  HPI Pre-visit discussion using our clinic review tool. No additional management support is needed unless otherwise documented below in the visit note.  79 year old patient who is seen today as a work in the 2 unsteady gait and frequent falls. He is a resident of [facility] and is accompanied by his wife today. He has used a cane for some time and does have access to a walker he has fallen several times over the past 2 months. He does have a history of coronary artery disease and prior stroke. He has hypertension and fairly advanced dementia. Symptoms do not sound orthostatic.  Past Medical History  Diagnosis Date  . Pacemaker 08/09/2006    family denies this history  . Complete heart block 11/21/2008    intermittent  . BENIGN PROSTATIC HYPERTROPHY 08/09/2006  . CEREBROVASCULAR ACCIDENT, HX OF 08/09/2006  . COLONIC POLYPS, HX OF 08/09/2006  . COPD 01/13/2009  . CORONARY ARTERY DISEASE 01/12/2008    Drug eluting stent Circumflex 2009;  LHC 02/08/11:  CFX stent patent, LVF normal, left ventricle "spade-shaped" consistent with hypertensive heart disease  . GERD 08/09/2006  . HYPERLIPIDEMIA 08/09/2006  . HYPERTENSION, BENIGN 08/13/2008  . MELENA, HX OF 02/01/2008  . PHLEBITIS, LOWER EXTREMITY 06/08/2007  . Polymyalgia rheumatica 08/09/2006  . SYNCOPE 12/25/2008    Secondary heart block  . VENTRICULAR TACHYCARDIA 01/12/2008    Nonsustained  . WEAKNESS 11/21/2008  . Complication of anesthesia 02/08/11    "? 1966; I think he just quit breathing after Demerol"  . Pacemaker     DOI 2012  . Pneumonia 02/08/11    "walking pneumonia; several years ago"  . Whooping cough     "as a child"  . Cancer   . Arthritis   . DEMENTIA   . Atrioventricular block, complete     History   Social History  . Marital Status: Married    Spouse Name: N/A    Number of Children: N/A  . Years of Education:  N/A   Occupational History  . retired    Social History Main Topics  . Smoking status: Former Smoker -- 1.00 packs/day for 71 years    Types: Cigarettes, Cigars    Quit date: 03/16/1991  . Smokeless tobacco: Never Used     Comment: Pt is smoking 2 cigars a day.  . Alcohol Use: Yes     Comment: "used to have glass of wine or beer occasionally; hasn't had one since ~ 2002"  . Drug Use: No  . Sexual Activity: No   Other Topics Concern  . Not on file   Social History Narrative  . No narrative on file    Past Surgical History  Procedure Laterality Date  . Lumbar laminectomy    . Transurethral resection of prostate    . Coronary angioplasty with stent placement    . Cataract extraction w/ intraocular lens  implant, bilateral  ~ 2000  . Back surgery    . Insert / replace / remove pacemaker  09/07/10    initial placement  . Carotid endarterectomy      left    Family History  Problem Relation Age of Onset  . Heart disease Father     Allergies  Allergen Reactions  . Atorvastatin Other (See Comments)    Unknown.  . Demerol [Meperidine]   . Meperidine Hcl Other (See  Comments)    Unknown Patient wife states it "knocked him out" years ago    Current Outpatient Prescriptions on File Prior to Visit  Medication Sig Dispense Refill  . aspirin EC 81 MG tablet Take 81 mg by mouth daily.        . clonazePAM (KLONOPIN) 0.5 MG tablet TAKE ONE-HALF TABLET BY MOUTH EVERY MORNING AND ONE TABLET BY MOUTH LATE AFTERNOON  90 tablet  1  . clopidogrel (PLAVIX) 75 MG tablet Take 1 tablet (75 mg total) by mouth daily.  90 tablet  3  . divalproex (DEPAKOTE) 250 MG DR tablet TAKE ONE TABLET BY MOUTH EVERY DAY  90 tablet  1  . metoprolol tartrate (LOPRESSOR) 25 MG tablet Take 1 tablet (25 mg total) by mouth 2 (two) times daily.  180 tablet  3  . pravastatin (PRAVACHOL) 40 MG tablet Take 40 mg by mouth daily.       . QUEtiapine (SEROQUEL) 25 MG tablet Take 25 mg by mouth 2 (two) times daily.       . ranitidine (ZANTAC) 150 MG tablet Take 150 mg by mouth at bedtime.        Marland Kitchen albuterol (ACCUNEB) 0.63 MG/3ML nebulizer solution Take 3 mLs (0.63 mg total) by nebulization every 2 (two) hours as needed for wheezing.  720 mL  12  . ipratropium-albuterol (DUONEB) 0.5-2.5 (3) MG/3ML SOLN Take 3 mLs by nebulization every 6 (six) hours as needed.  360 mL  12   No current facility-administered medications on file prior to visit.    BP 120/80  Pulse 79  Temp(Src) 98 F (36.7 C) (Oral)  Resp 20  Wt 192 lb (87.091 kg)  SpO2 95%       Review of Systems  Constitutional: Negative for fever, chills, appetite change and fatigue.  HENT: Negative for congestion, dental problem, ear pain, hearing loss, sore throat, tinnitus, trouble swallowing and voice change.   Eyes: Negative for pain, discharge and visual disturbance.  Respiratory: Negative for cough, chest tightness, wheezing and stridor.   Cardiovascular: Negative for chest pain, palpitations and leg swelling.  Gastrointestinal: Negative for nausea, vomiting, abdominal pain, diarrhea, constipation, blood in stool and abdominal distention.  Genitourinary: Negative for urgency, hematuria, flank pain, discharge, difficulty urinating and genital sores.  Musculoskeletal: Positive for arthralgias and gait problem. Negative for back pain, joint swelling, myalgias and neck stiffness.  Skin: Negative for rash.  Neurological: Negative for dizziness, syncope, speech difficulty, weakness, numbness and headaches.  Hematological: Negative for adenopathy. Does not bruise/bleed easily.  Psychiatric/Behavioral: Positive for behavioral problems, confusion and decreased concentration. Negative for dysphoric mood. The patient is not nervous/anxious.        Objective:   Physical Exam  Constitutional: He is oriented to person, place, and time. He appears well-developed.  Blood pressure 120/80  HENT:  Head: Normocephalic.  Right Ear: External ear normal.    Left Ear: External ear normal.  Eyes: Conjunctivae and EOM are normal.  Neck: Normal range of motion.  Cardiovascular: Normal rate and normal heart sounds.   Pulmonary/Chest: Breath sounds normal.  Abdominal: Bowel sounds are normal.  Musculoskeletal: Normal range of motion. He exhibits no edema and no tenderness.  Walks with a cane Very unsteady No drift of the outstretched arms No obvious leg weakness  Neurological: He is alert and oriented to person, place, and time.  Psychiatric: He has a normal mood and affect. His behavior is normal.  Moderate to severe dementia  Assessment & Plan:   Unsteady gait. Multi-factorial. We'll encourage the use of a walker. Patient does have a walker available Dementia Coronary artery disease COPD  Recheck 6 months or as needed

## 2013-02-16 NOTE — Progress Notes (Signed)
Pre-visit discussion using our clinic review tool. No additional management support is needed unless otherwise documented below in the visit note.  

## 2013-02-16 NOTE — Telephone Encounter (Signed)
Add on 4:30 appointment

## 2013-02-21 ENCOUNTER — Telehealth: Payer: Self-pay | Admitting: Internal Medicine

## 2013-02-21 NOTE — Telephone Encounter (Signed)
Please call/notify patient that lab/test/procedure is normal 

## 2013-02-21 NOTE — Telephone Encounter (Signed)
Requesting lab results from last week.  Please return call to pt's wife.

## 2013-02-21 NOTE — Telephone Encounter (Signed)
Spoke to pt's wife Irving Burton told her lab results were normal per Dr.Kwiatkowski. Irving Burton verbalized understanding.

## 2013-02-21 NOTE — Telephone Encounter (Signed)
Pt's wife calling for lab results from 12/5 please advise.

## 2013-03-22 ENCOUNTER — Telehealth: Payer: Self-pay | Admitting: Internal Medicine

## 2013-03-22 NOTE — Telephone Encounter (Signed)
New message     Pt is in a home for demenitia.  They will do his remote pacer check there, but patient does not have a transmission box.  Can you get them a box?

## 2013-03-22 NOTE — Telephone Encounter (Signed)
Spoke with wife.  [name] [name] is @ [facility] room D9.  We will continue to check in there since remote follow up has failed.

## 2013-03-29 ENCOUNTER — Encounter: Payer: Medicare Other | Admitting: *Deleted

## 2013-04-02 ENCOUNTER — Encounter: Payer: Self-pay | Admitting: Internal Medicine

## 2013-04-02 ENCOUNTER — Ambulatory Visit (INDEPENDENT_AMBULATORY_CARE_PROVIDER_SITE_OTHER): Payer: Medicare Other | Admitting: Internal Medicine

## 2013-04-02 VITALS — BP 130/80 | HR 82 | Temp 98.0°F | Resp 22 | Ht 72.0 in | Wt 180.0 lb

## 2013-04-02 DIAGNOSIS — J4489 Other specified chronic obstructive pulmonary disease: Secondary | ICD-10-CM

## 2013-04-02 DIAGNOSIS — B9789 Other viral agents as the cause of diseases classified elsewhere: Secondary | ICD-10-CM

## 2013-04-02 DIAGNOSIS — J449 Chronic obstructive pulmonary disease, unspecified: Secondary | ICD-10-CM

## 2013-04-02 DIAGNOSIS — I10 Essential (primary) hypertension: Secondary | ICD-10-CM

## 2013-04-02 DIAGNOSIS — J069 Acute upper respiratory infection, unspecified: Secondary | ICD-10-CM

## 2013-04-02 DIAGNOSIS — R413 Other amnesia: Secondary | ICD-10-CM

## 2013-04-02 NOTE — Patient Instructions (Signed)
Robitussin-DM 2 teaspoons every 6 hours as needed for cough or congestion  Call or return to clinic prn if these symptoms worsen or fail to improve as anticipated.  Acute bronchitis symptoms for less than 10 days are generally not helped by antibiotics.  Take over-the-counter expectorants and cough medications such as  Robitussin  DM.  Call if there is no improvement in 5 to 7 days or if he developed worsening cough, fever, or new symptoms, such as shortness of breath or chest pain.

## 2013-04-02 NOTE — Progress Notes (Signed)
Pre-visit discussion using our clinic review tool. No additional management support is needed unless otherwise documented below in the visit note.  

## 2013-04-02 NOTE — Progress Notes (Signed)
Subjective:    Patient ID: [name] [name], male    DOB: [birth year], 80 y.o.   MRN: [id number]  HPI  80 year old patient who has significant dementia coronary artery disease and as well as a history of COPD. He is a resident of [facility]. For the past 5 days she has had some cough and congestion. There's been no fever or decline in his appetite or intake of fluids. No wheezing or shortness of breath. His wife relates a history of more frequent fecal continence. She states that multiple members of the facility had developed a cough and congestion. Other complaints include worsening pedal edema  Past Medical History  Diagnosis Date  . Pacemaker 08/09/2006    family denies this history  . Complete heart block 11/21/2008    intermittent  . BENIGN PROSTATIC HYPERTROPHY 08/09/2006  . CEREBROVASCULAR ACCIDENT, HX OF 08/09/2006  . COLONIC POLYPS, HX OF 08/09/2006  . COPD 01/13/2009  . CORONARY ARTERY DISEASE 01/12/2008    Drug eluting stent Circumflex 2009;  LHC 02/08/11:  CFX stent patent, LVF normal, left ventricle "spade-shaped" consistent with hypertensive heart disease  . GERD 08/09/2006  . HYPERLIPIDEMIA 08/09/2006  . HYPERTENSION, BENIGN 08/13/2008  . MELENA, HX OF 02/01/2008  . PHLEBITIS, LOWER EXTREMITY 06/08/2007  . Polymyalgia rheumatica 08/09/2006  . SYNCOPE 12/25/2008    Secondary heart block  . VENTRICULAR TACHYCARDIA 01/12/2008    Nonsustained  . WEAKNESS 11/21/2008  . Complication of anesthesia 02/08/11    "? 1966; I think he just quit breathing after Demerol"  . Pacemaker     DOI 2012  . Pneumonia 02/08/11    "walking pneumonia; several years ago"  . Whooping cough     "as a child"  . Cancer   . Arthritis   . DEMENTIA   . Atrioventricular block, complete     History   Social History  . Marital Status: Married    Spouse Name: N/A    Number of Children: N/A  . Years of Education: N/A   Occupational History  . retired    Social History Main Topics  . Smoking  status: Former Smoker -- 1.00 packs/day for 71 years    Types: Cigarettes, Cigars    Quit date: 03/16/1991  . Smokeless tobacco: Never Used     Comment: Pt is smoking 2 cigars a day.  . Alcohol Use: Yes     Comment: "used to have glass of wine or beer occasionally; hasn't had one since ~ 2002"  . Drug Use: No  . Sexual Activity: No   Other Topics Concern  . Not on file   Social History Narrative  . No narrative on file    Past Surgical History  Procedure Laterality Date  . Lumbar laminectomy    . Transurethral resection of prostate    . Coronary angioplasty with stent placement    . Cataract extraction w/ intraocular lens  implant, bilateral  ~ 2000  . Back surgery    . Insert / replace / remove pacemaker  09/07/10    initial placement  . Carotid endarterectomy      left    Family History  Problem Relation Age of Onset  . Heart disease Father     Allergies  Allergen Reactions  . Atorvastatin Other (See Comments)    Unknown.  . Demerol [Meperidine]   . Meperidine Hcl Other (See Comments)    Unknown Patient wife states it "knocked him out" years ago    Current  Outpatient Prescriptions on File Prior to Visit  Medication Sig Dispense Refill  . albuterol (ACCUNEB) 0.63 MG/3ML nebulizer solution Take 3 mLs (0.63 mg total) by nebulization every 2 (two) hours as needed for wheezing.  720 mL  12  . aspirin EC 81 MG tablet Take 81 mg by mouth daily.        . clonazePAM (KLONOPIN) 0.5 MG tablet TAKE ONE-HALF TABLET BY MOUTH EVERY MORNING AND ONE TABLET BY MOUTH LATE AFTERNOON  90 tablet  1  . clopidogrel (PLAVIX) 75 MG tablet Take 1 tablet (75 mg total) by mouth daily.  90 tablet  3  . divalproex (DEPAKOTE) 250 MG DR tablet TAKE ONE TABLET BY MOUTH EVERY DAY  90 tablet  1  . ipratropium-albuterol (DUONEB) 0.5-2.5 (3) MG/3ML SOLN Take 3 mLs by nebulization every 6 (six) hours as needed.  360 mL  12  . metoprolol tartrate (LOPRESSOR) 25 MG tablet Take 1 tablet (25 mg total) by  mouth 2 (two) times daily.  180 tablet  3  . pravastatin (PRAVACHOL) 40 MG tablet Take 40 mg by mouth daily.       . QUEtiapine (SEROQUEL) 25 MG tablet Take 25 mg by mouth 2 (two) times daily.      . ranitidine (ZANTAC) 150 MG tablet Take 150 mg by mouth at bedtime.         No current facility-administered medications on file prior to visit.    BP 130/80  Pulse 82  Temp(Src) 98 F (36.7 C) (Oral)  Resp 22  Ht 6' (1.829 m)  Wt 180 lb (81.647 kg)  BMI 24.41 kg/m2  SpO2 94%       Review of Systems  Constitutional: Positive for fatigue. Negative for fever, chills and appetite change.  HENT: Positive for postnasal drip and voice change. Negative for congestion, dental problem, ear pain, hearing loss, sore throat, tinnitus and trouble swallowing.   Eyes: Negative for pain, discharge and visual disturbance.  Respiratory: Positive for cough. Negative for chest tightness, wheezing and stridor.   Cardiovascular: Negative for chest pain, palpitations and leg swelling.  Gastrointestinal: Negative for nausea, vomiting, abdominal pain, diarrhea, constipation, blood in stool and abdominal distention.  Genitourinary: Negative for urgency, hematuria, flank pain, discharge, difficulty urinating and genital sores.  Musculoskeletal: Negative for arthralgias, back pain, gait problem, joint swelling, myalgias and neck stiffness.  Skin: Negative for rash.  Neurological: Negative for dizziness, syncope, speech difficulty, weakness, numbness and headaches.  Hematological: Negative for adenopathy. Does not bruise/bleed easily.  Psychiatric/Behavioral: Negative for behavioral problems and dysphoric mood. The patient is not nervous/anxious.        Objective:   Physical Exam  Constitutional: He is oriented to person, place, and time. He appears well-developed and well-nourished. No distress.  HENT:  Head: Normocephalic.  Right Ear: External ear normal.  Left Ear: External ear normal.  Eyes:  Conjunctivae and EOM are normal.  Neck: Normal range of motion.  Cardiovascular: Normal rate and normal heart sounds.   Pulmonary/Chest: Effort normal and breath sounds normal. He has no wheezes.  Abdominal: Bowel sounds are normal.  Musculoskeletal: Normal range of motion. He exhibits edema. He exhibits no tenderness.  +1 pedal edema  Neurological: He is alert and oriented to person, place, and time.  Psychiatric: He has a normal mood and affect. His behavior is normal.          Assessment & Plan:   Dementia COPD with viral URI. Will treat symptomatically Hypertension stable  Medications  updated Recheck 6 months

## 2013-04-03 ENCOUNTER — Telehealth: Payer: Self-pay | Admitting: Internal Medicine

## 2013-04-03 NOTE — Telephone Encounter (Signed)
Relevant patient education mailed to patient.  

## 2013-04-07 ENCOUNTER — Encounter: Payer: Self-pay | Admitting: Internal Medicine

## 2013-04-09 ENCOUNTER — Ambulatory Visit (INDEPENDENT_AMBULATORY_CARE_PROVIDER_SITE_OTHER): Payer: Medicare Other | Admitting: *Deleted

## 2013-04-09 DIAGNOSIS — I442 Atrioventricular block, complete: Secondary | ICD-10-CM

## 2013-04-10 LAB — MDC_IDC_ENUM_SESS_TYPE_REMOTE
Battery Remaining Longevity: 110 mo
Battery Voltage: 2.96 V
Date Time Interrogation Session: 20150124190111
Implantable Pulse Generator Model: 2210
Implantable Pulse Generator Serial Number: 7241194
Lead Channel Impedance Value: 390 Ohm
Lead Channel Pacing Threshold Amplitude: 0.75 V
Lead Channel Pacing Threshold Pulse Width: 0.4 ms
Lead Channel Pacing Threshold Pulse Width: 0.4 ms
Lead Channel Sensing Intrinsic Amplitude: 1 mV
Lead Channel Sensing Intrinsic Amplitude: 12 mV
Lead Channel Setting Pacing Amplitude: 1.625
Lead Channel Setting Pacing Pulse Width: 0.4 ms
MDC IDC MSMT LEADCHNL RA PACING THRESHOLD AMPLITUDE: 0.625 V
MDC IDC MSMT LEADCHNL RV IMPEDANCE VALUE: 540 Ohm
MDC IDC SET LEADCHNL RV PACING AMPLITUDE: 1 V
MDC IDC SET LEADCHNL RV SENSING SENSITIVITY: 2 mV
MDC IDC STAT BRADY AP VP PERCENT: 1 %
MDC IDC STAT BRADY AP VS PERCENT: 16 %
MDC IDC STAT BRADY AS VP PERCENT: 1 %
MDC IDC STAT BRADY AS VS PERCENT: 83 %
MDC IDC STAT BRADY RA PERCENT PACED: 15 %
MDC IDC STAT BRADY RV PERCENT PACED: 1 %

## 2013-04-18 ENCOUNTER — Telehealth: Payer: Self-pay | Admitting: Internal Medicine

## 2013-04-18 NOTE — Telephone Encounter (Signed)
Relevant patient education mailed to patient.  

## 2013-04-24 ENCOUNTER — Encounter: Payer: Self-pay | Admitting: *Deleted

## 2013-04-28 ENCOUNTER — Emergency Department (HOSPITAL_COMMUNITY)
Admission: EM | Admit: 2013-04-28 | Discharge: 2013-04-28 | Disposition: A | Discharge status: Home / Self Care | Payer: Medicare Other | Attending: Emergency Medicine | Admitting: Emergency Medicine

## 2013-04-28 ENCOUNTER — Emergency Department (HOSPITAL_COMMUNITY): Payer: Medicare Other

## 2013-04-28 ENCOUNTER — Encounter (HOSPITAL_COMMUNITY): Payer: Self-pay | Admitting: Emergency Medicine

## 2013-04-28 DIAGNOSIS — IMO0002 Reserved for concepts with insufficient information to code with codable children: Secondary | ICD-10-CM | POA: Insufficient documentation

## 2013-04-28 DIAGNOSIS — Z8701 Personal history of pneumonia (recurrent): Secondary | ICD-10-CM | POA: Insufficient documentation

## 2013-04-28 DIAGNOSIS — Z79899 Other long term (current) drug therapy: Secondary | ICD-10-CM | POA: Insufficient documentation

## 2013-04-28 DIAGNOSIS — Z950 Presence of cardiac pacemaker: Secondary | ICD-10-CM | POA: Insufficient documentation

## 2013-04-28 DIAGNOSIS — T148XXA Other injury of unspecified body region, initial encounter: Secondary | ICD-10-CM

## 2013-04-28 DIAGNOSIS — F0390 Unspecified dementia without behavioral disturbance: Secondary | ICD-10-CM | POA: Insufficient documentation

## 2013-04-28 DIAGNOSIS — I10 Essential (primary) hypertension: Secondary | ICD-10-CM | POA: Insufficient documentation

## 2013-04-28 DIAGNOSIS — Z859 Personal history of malignant neoplasm, unspecified: Secondary | ICD-10-CM | POA: Insufficient documentation

## 2013-04-28 DIAGNOSIS — Z23 Encounter for immunization: Secondary | ICD-10-CM | POA: Insufficient documentation

## 2013-04-28 DIAGNOSIS — K219 Gastro-esophageal reflux disease without esophagitis: Secondary | ICD-10-CM | POA: Insufficient documentation

## 2013-04-28 DIAGNOSIS — Y929 Unspecified place or not applicable: Secondary | ICD-10-CM | POA: Insufficient documentation

## 2013-04-28 DIAGNOSIS — J4489 Other specified chronic obstructive pulmonary disease: Secondary | ICD-10-CM | POA: Insufficient documentation

## 2013-04-28 DIAGNOSIS — I2510 Atherosclerotic heart disease of native coronary artery without angina pectoris: Secondary | ICD-10-CM | POA: Insufficient documentation

## 2013-04-28 DIAGNOSIS — Z9861 Coronary angioplasty status: Secondary | ICD-10-CM | POA: Insufficient documentation

## 2013-04-28 DIAGNOSIS — E785 Hyperlipidemia, unspecified: Secondary | ICD-10-CM | POA: Insufficient documentation

## 2013-04-28 DIAGNOSIS — Z87448 Personal history of other diseases of urinary system: Secondary | ICD-10-CM | POA: Insufficient documentation

## 2013-04-28 DIAGNOSIS — Z7902 Long term (current) use of antithrombotics/antiplatelets: Secondary | ICD-10-CM | POA: Insufficient documentation

## 2013-04-28 DIAGNOSIS — J449 Chronic obstructive pulmonary disease, unspecified: Secondary | ICD-10-CM | POA: Insufficient documentation

## 2013-04-28 DIAGNOSIS — Z8619 Personal history of other infectious and parasitic diseases: Secondary | ICD-10-CM | POA: Insufficient documentation

## 2013-04-28 DIAGNOSIS — Z7982 Long term (current) use of aspirin: Secondary | ICD-10-CM | POA: Insufficient documentation

## 2013-04-28 DIAGNOSIS — Z860100 Personal history of colon polyps, unspecified: Secondary | ICD-10-CM | POA: Insufficient documentation

## 2013-04-28 DIAGNOSIS — Z8673 Personal history of transient ischemic attack (TIA), and cerebral infarction without residual deficits: Secondary | ICD-10-CM | POA: Insufficient documentation

## 2013-04-28 DIAGNOSIS — W06XXXA Fall from bed, initial encounter: Secondary | ICD-10-CM | POA: Insufficient documentation

## 2013-04-28 DIAGNOSIS — Z87891 Personal history of nicotine dependence: Secondary | ICD-10-CM | POA: Insufficient documentation

## 2013-04-28 DIAGNOSIS — W19XXXA Unspecified fall, initial encounter: Secondary | ICD-10-CM

## 2013-04-28 DIAGNOSIS — Y9389 Activity, other specified: Secondary | ICD-10-CM | POA: Insufficient documentation

## 2013-04-28 DIAGNOSIS — M129 Arthropathy, unspecified: Secondary | ICD-10-CM | POA: Insufficient documentation

## 2013-04-28 MED ORDER — TETANUS-DIPHTH-ACELL PERTUSSIS 5-2.5-18.5 LF-MCG/0.5 IM SUSP
0.5000 mL | Freq: Once | INTRAMUSCULAR | Status: AC
  Administered 2013-04-28: 0.5 mL via INTRAMUSCULAR
  Filled 2013-04-28: qty 0.5

## 2013-04-28 MED ORDER — BACITRACIN ZINC 500 UNIT/GM EX OINT
1.0000 "application " | TOPICAL_OINTMENT | Freq: Two times a day (BID) | CUTANEOUS | Status: DC
  Administered 2013-04-28: 1 via TOPICAL

## 2013-04-28 NOTE — Discharge Instructions (Signed)
Abrasion °An abrasion is a cut or scrape of the skin. Abrasions do not extend through all layers of the skin and most heal within 10 days. It is important to care for your abrasion properly to prevent infection. °CAUSES  °Most abrasions are caused by falling on, or gliding across, the ground or other surface. When your skin rubs on something, the outer and inner layer of skin rubs off, causing an abrasion. °DIAGNOSIS  °Your caregiver will be able to diagnose an abrasion during a physical exam.  °TREATMENT  °Your treatment depends on how large and deep the abrasion is. Generally, your abrasion will be cleaned with water and a mild soap to remove any dirt or debris. An antibiotic ointment may be put over the abrasion to prevent an infection. A bandage (dressing) may be wrapped around the abrasion to keep it from getting dirty.  °You may need a tetanus shot if: °· You cannot remember when you had your last tetanus shot. °· You have never had a tetanus shot. °· The injury broke your skin. °If you get a tetanus shot, your arm may swell, get red, and feel warm to the touch. This is common and not a problem. If you need a tetanus shot and you choose not to have one, there is a rare chance of getting tetanus. Sickness from tetanus can be serious.  °HOME CARE INSTRUCTIONS  °· If a dressing was applied, change it at least once a day or as directed by your caregiver. If the bandage sticks, soak it off with warm water.   °· Wash the area with water and a mild soap to remove all the ointment 2 times a day. Rinse off the soap and pat the area dry with a clean towel.   °· Reapply any ointment as directed by your caregiver. This will help prevent infection and keep the bandage from sticking. Use gauze over the wound and under the dressing to help keep the bandage from sticking.   °· Change your dressing right away if it becomes wet or dirty.   °· Only take over-the-counter or prescription medicines for pain, discomfort, or fever as  directed by your caregiver.   °· Follow up with your caregiver within 24 48 hours for a wound check, or as directed. If you were not given a wound-check appointment, look closely at your abrasion for redness, swelling, or pus. These are signs of infection. °SEEK IMMEDIATE MEDICAL CARE IF:  °· You have increasing pain in the wound.   °· You have redness, swelling, or tenderness around the wound.   °· You have pus coming from the wound.   °· You have a fever or persistent symptoms for more than 2 3 days. °· You have a fever and your symptoms suddenly get worse. °· You have a bad smell coming from the wound or dressing.   °MAKE SURE YOU:  °· Understand these instructions. °· Will watch your condition. °· Will get help right away if you are not doing well or get worse. °Document Released: 12/09/2004 Document Revised: 02/16/2012 Document Reviewed: 02/02/2011 °[employer]® Patient Information ©2014 [employer]. ° °

## 2013-04-28 NOTE — ED Notes (Signed)
EMS called to [facility] at [facility].  Found patient sitting against wall  Post witnessed fall. Patient complains of laceration to bridge of nose. Nose does  Have some drainage on arrival to ED.  Patient is on plavix.

## 2013-04-28 NOTE — ED Notes (Signed)
Patient is alert and oriented x3.  He was given DC instructions and follow up visit instructions.  Patient gave verbal understanding.  He was DC ambulatory under his own power to home.  V/S stable.  He was not showing any signs of distress on DC 

## 2013-04-28 NOTE — ED Provider Notes (Signed)
CSN: [id number]     Arrival date & time 04/28/13  0007 History   First MD Initiated Contact with Patient 04/28/13 0029     Chief Complaint  Patient presents with  . Fall  . Facial Laceration     (Consider location/radiation/quality/duration/timing/severity/associated sxs/prior Treatment) HPI History provided by EMS, nursing report, patient and family bedside - resides at extended care facility, was getting out of bed tonight had an unwitnessed fall. EMS was called. Patient uncertain if he passed out, denies any pain, presents with abrasion over the bridge of his nose. He is on Plavix. He denies any chest pain, shortness of breath, abdominal pain, nausea vomiting. Family bedside are unaware of any recent complaints or medication changes. Patient has a history of dementia.  Past Medical History  Diagnosis Date  . Pacemaker 08/09/2006    family denies this history  . Complete heart block 11/21/2008    intermittent  . BENIGN PROSTATIC HYPERTROPHY 08/09/2006  . CEREBROVASCULAR ACCIDENT, HX OF 08/09/2006  . COLONIC POLYPS, HX OF 08/09/2006  . COPD 01/13/2009  . CORONARY ARTERY DISEASE 01/12/2008    Drug eluting stent Circumflex 2009;  LHC 02/08/11:  CFX stent patent, LVF normal, left ventricle "spade-shaped" consistent with hypertensive heart disease  . GERD 08/09/2006  . HYPERLIPIDEMIA 08/09/2006  . HYPERTENSION, BENIGN 08/13/2008  . MELENA, HX OF 02/01/2008  . PHLEBITIS, LOWER EXTREMITY 06/08/2007  . Polymyalgia rheumatica 08/09/2006  . SYNCOPE 12/25/2008    Secondary heart block  . VENTRICULAR TACHYCARDIA 01/12/2008    Nonsustained  . WEAKNESS 11/21/2008  . Complication of anesthesia 02/08/11    "? 1966; I think he just quit breathing after Demerol"  . Pacemaker     DOI 2012  . Pneumonia 02/08/11    "walking pneumonia; several years ago"  . Whooping cough     "as a child"  . Cancer   . Arthritis   . DEMENTIA   . Atrioventricular block, complete    Past Surgical History  Procedure  Laterality Date  . Lumbar laminectomy    . Transurethral resection of prostate    . Coronary angioplasty with stent placement    . Cataract extraction w/ intraocular lens  implant, bilateral  ~ 2000  . Back surgery    . Insert / replace / remove pacemaker  09/07/10    initial placement  . Carotid endarterectomy      left   Family History  Problem Relation Age of Onset  . Heart disease Father    History  Substance Use Topics  . Smoking status: Former Smoker -- 1.00 packs/day for 71 years    Types: Cigarettes, Cigars    Quit date: 03/16/1991  . Smokeless tobacco: Never Used     Comment: Pt is smoking 2 cigars a day.  . Alcohol Use: Yes     Comment: "used to have glass of wine or beer occasionally; hasn't had one since ~ 2002"    Review of Systems  Constitutional: Negative for fever and chills.  Respiratory: Negative for shortness of breath.   Cardiovascular: Negative for chest pain.  Gastrointestinal: Negative for abdominal pain.  Genitourinary: Negative for hematuria.  Musculoskeletal: Negative for back pain and neck stiffness.  Skin: Positive for wound.  Neurological: Negative for seizures.  All other systems reviewed and are negative.      Allergies  Atorvastatin; Demerol; and Meperidine hcl  Home Medications   Current Outpatient Rx  Name  Route  Sig  Dispense  Refill  . albuterol (  ACCUNEB) 0.63 MG/3ML nebulizer solution   Nebulization   Take 3 mLs (0.63 mg total) by nebulization every 2 (two) hours as needed for wheezing.   720 mL   12   . aspirin EC 81 MG tablet   Oral   Take 81 mg by mouth daily.           . clonazePAM (KLONOPIN) 0.5 MG tablet      TAKE ONE-HALF TABLET BY MOUTH EVERY MORNING AND ONE TABLET BY MOUTH LATE AFTERNOON   90 tablet   1   . clopidogrel (PLAVIX) 75 MG tablet   Oral   Take 1 tablet (75 mg total) by mouth daily.   90 tablet   3   . divalproex (DEPAKOTE) 250 MG DR tablet      TAKE ONE TABLET BY MOUTH EVERY DAY   90  tablet   1   . donepezil (ARICEPT) 10 MG tablet   Oral   Take 10 mg by mouth daily.          Marland Kitchen ipratropium-albuterol (DUONEB) 0.5-2.5 (3) MG/3ML SOLN   Nebulization   Take 3 mLs by nebulization every 6 (six) hours as needed.   360 mL   12   . metoprolol tartrate (LOPRESSOR) 25 MG tablet   Oral   Take 1 tablet (25 mg total) by mouth 2 (two) times daily.   180 tablet   3   . pravastatin (PRAVACHOL) 40 MG tablet   Oral   Take 40 mg by mouth at bedtime.          Marland Kitchen QUEtiapine (SEROQUEL) 25 MG tablet   Oral   Take 25 mg by mouth 2 (two) times daily.         . ranitidine (ZANTAC) 150 MG tablet   Oral   Take 150 mg by mouth at bedtime.            BP 162/87  Pulse 62  Temp(Src) 98.4 F (36.9 C) (Oral)  Resp 18  SpO2 98% Physical Exam  Constitutional: He appears well-developed and well-nourished.  HENT:  Head: Normocephalic.  Superficial abrasion over the bridge of the nose without obvious bony deformity. No active bleeding. No deep laceration. No epistaxis. No midface instability. TMs clear.  Eyes: EOM are normal. Pupils are equal, round, and reactive to light.  Neck: Neck supple.  No cervical spine tenderness or deformity  Cardiovascular: Normal rate, regular rhythm and intact distal pulses.   Pulmonary/Chest: Effort normal and breath sounds normal. No respiratory distress. He exhibits no tenderness.  Abdominal: Soft. He exhibits no distension. There is no tenderness.  Musculoskeletal: Normal range of motion. He exhibits no edema and no tenderness.  moves all extremities x 4, no areas of tenderness or deformity  Neurological: No cranial nerve deficit.  Speech clear, awake alert and oriented, no unilateral deficits  Skin: Skin is warm and dry.    ED Course  Procedures (including critical care time) Labs Review Labs Reviewed - No data to display Imaging Review Dg Cervical Spine Complete  04/28/2013   CLINICAL DATA:  Fall with facial laceration.  EXAM:  CERVICAL SPINE  4+ VIEWS  COMPARISON:  CT ANGIO NECK W/CM &/OR WO/CM dated 08/05/2006  FINDINGS: C7 is obscured on the lateral images due to overlapping soft tissues, including on the swimmer's view.  Prevertebral soft tissues are unremarkable. Vertebral alignment is normal. Mild to moderate disc space narrowing and endplate spurring are present throughout the mid and lower cervical spine. There  is multilevel facet arthrosis. Apparent multilevel right neural foraminal narrowing may be exaggerated by obliquity.  IMPRESSION: No acute osseous abnormality identified.   Electronically Signed   By: Logan Bores   On: 04/28/2013 01:17   Ct Head Wo Contrast  04/28/2013   CLINICAL DATA:  Fall with laceration to nose.  EXAM: CT HEAD WITHOUT CONTRAST  TECHNIQUE: Contiguous axial images were obtained from the base of the skull through the vertex without intravenous contrast.  COMPARISON:  09/09/2010  FINDINGS: There is no evidence of acute cortical infarct or intracranial hemorrhage. Moderate cerebral atrophy is unchanged. Periventricular white-matter hypodensities are similar to the prior exam and compatible with mild chronic small vessel ischemic disease. Remote right caudate lacunar infarct is unchanged. There is no evidence of mass, midline shift, or extra-axial fluid collection.  Prior bilateral cataract surgery is noted. Mild soft tissue swelling is present involving the nose. No displaced nasal bone fracture is identified. Visualized mastoid air cells and paranasal sinuses are clear.  IMPRESSION: 1. No evidence of acute intracranial abnormality. 2. Unchanged cerebral atrophy and chronic small vessel ischemic disease. 3. Soft tissue swelling involving the nose. No displaced nasal bone fracture identified.   Electronically Signed   By: Logan Bores   On: 04/28/2013 01:12    Wound care provided. Bacitracin dressing. Tetanus updated.  Patient ambulates, tolerates by mouth fluids. Patient and family bedside comfortable  with plan discharge back to facility. Fall precautions provided. Plan followup primary care physician for recheck and review of medications  MDM   Diagnosis: Fall, anticoagulated on Plavix, abrasion to nose  Evaluated with CT scans obtained and reviewed as above, no intracranial hemorrhage or cervical spine fracture. Serial evaluations without any specific complaints or clinical findings to suggest indication for further workup at this time. Vital signs nurse's notes reviewed and considered.   Teressa Lower, MD 04/28/13 [phone]

## 2013-04-28 NOTE — ED Notes (Signed)
Bed: WA12 Expected date:  Expected time:  Means of arrival:  Comments: EMS 

## 2013-05-09 ENCOUNTER — Telehealth: Payer: Self-pay | Admitting: Internal Medicine

## 2013-05-09 MED ORDER — CLONAZEPAM 0.5 MG PO TABS: ORAL_TABLET | ORAL | Status: DC

## 2013-05-09 MED ORDER — QUETIAPINE FUMARATE 25 MG PO TABS: 25.0000 mg | ORAL_TABLET | Freq: Two times a day (BID) | ORAL | Status: DC

## 2013-05-09 MED ORDER — DONEPEZIL HCL 10 MG PO TABS: 10.0000 mg | ORAL_TABLET | Freq: Every day | ORAL | Status: DC

## 2013-05-09 MED ORDER — CLOPIDOGREL BISULFATE 75 MG PO TABS
75.0000 mg | ORAL_TABLET | Freq: Every day | ORAL | Status: AC
Start: 2013-05-09 — End: ?

## 2013-05-09 MED ORDER — METOPROLOL TARTRATE 25 MG PO TABS: 25.0000 mg | ORAL_TABLET | Freq: Two times a day (BID) | ORAL | Status: DC

## 2013-05-09 MED ORDER — DIVALPROEX SODIUM 250 MG PO DR TAB: DELAYED_RELEASE_TABLET | ORAL | Status: DC

## 2013-05-09 MED ORDER — PRAVASTATIN SODIUM 40 MG PO TABS: 40.0000 mg | ORAL_TABLET | Freq: Every day | ORAL | Status: DC

## 2013-05-09 NOTE — Telephone Encounter (Signed)
[facility] requesting new scripts for the following:  QUEtiapine (SEROQUEL) 25 MG tablet clopidogrel (PLAVIX) 75 MG tablet divalproex (DEPAKOTE) 250 MG DR tablet donepezil (ARICEPT) 10 MG tablet metoprolol tartrate (LOPRESSOR) 25 MG tablet clonazePAM (KLONOPIN) 0.5 MG tablet pravastatin (PRAVACHOL) 40 MG tablet

## 2013-05-13 ENCOUNTER — Encounter: Payer: Self-pay | Admitting: Internal Medicine

## 2013-06-04 ENCOUNTER — Ambulatory Visit (INDEPENDENT_AMBULATORY_CARE_PROVIDER_SITE_OTHER): Payer: Medicare Other | Admitting: Internal Medicine

## 2013-06-04 ENCOUNTER — Encounter: Payer: Self-pay | Admitting: Internal Medicine

## 2013-06-04 VITALS — BP 110/70 | HR 67 | Temp 97.9°F | Resp 20 | Ht 72.0 in | Wt 176.0 lb

## 2013-06-04 DIAGNOSIS — J449 Chronic obstructive pulmonary disease, unspecified: Secondary | ICD-10-CM

## 2013-06-04 DIAGNOSIS — I2510 Atherosclerotic heart disease of native coronary artery without angina pectoris: Secondary | ICD-10-CM

## 2013-06-04 DIAGNOSIS — I10 Essential (primary) hypertension: Secondary | ICD-10-CM

## 2013-06-04 DIAGNOSIS — R413 Other amnesia: Secondary | ICD-10-CM

## 2013-06-04 MED ORDER — METOPROLOL TARTRATE 25 MG PO TABS: ORAL_TABLET | ORAL | Status: AC

## 2013-06-04 MED ORDER — CLONAZEPAM 0.5 MG PO TABS: ORAL_TABLET | ORAL | Status: DC

## 2013-06-04 MED ORDER — PRAVASTATIN SODIUM 40 MG PO TABS: 40.0000 mg | ORAL_TABLET | Freq: Every day | ORAL | Status: AC

## 2013-06-04 MED ORDER — QUETIAPINE FUMARATE 25 MG PO TABS: ORAL_TABLET | ORAL | Status: DC

## 2013-06-04 MED ORDER — RANITIDINE HCL 150 MG PO TABS: ORAL_TABLET | ORAL | Status: AC

## 2013-06-04 MED ORDER — CLONAZEPAM 0.25 MG PO TBDP: ORAL_TABLET | ORAL | Status: DC

## 2013-06-04 NOTE — Progress Notes (Signed)
Subjective:    Patient ID: [name] [name], male    DOB: 10-28-1925, 80 y.o.   MRN: [id number]  HPI  80 year old patient who is seen today for followup.  He has a history of coronary artery and cerebrovascular disease.  He has dementia and history of COPD.  He has treated hypertension. Today he is coming by his wife.  He resides in an extended care facility. He has refused to bedtime dosing of all his medications. Clinically, he has done well.  No chest pain or shortness of breath.  Lab review from December 2014  Past Medical History  Diagnosis Date  . Pacemaker 08/09/2006    family denies this history  . Complete heart block 11/21/2008    intermittent  . BENIGN PROSTATIC HYPERTROPHY 08/09/2006  . CEREBROVASCULAR ACCIDENT, HX OF 08/09/2006  . COLONIC POLYPS, HX OF 08/09/2006  . COPD 01/13/2009  . CORONARY ARTERY DISEASE 01/12/2008    Drug eluting stent Circumflex 2009;  LHC 02/08/11:  CFX stent patent, LVF normal, left ventricle "spade-shaped" consistent with hypertensive heart disease  . GERD 08/09/2006  . HYPERLIPIDEMIA 08/09/2006  . HYPERTENSION, BENIGN 08/13/2008  . MELENA, HX OF 02/01/2008  . PHLEBITIS, LOWER EXTREMITY 06/08/2007  . Polymyalgia rheumatica 08/09/2006  . SYNCOPE 12/25/2008    Secondary heart block  . VENTRICULAR TACHYCARDIA 01/12/2008    Nonsustained  . WEAKNESS 11/21/2008  . Complication of anesthesia 02/08/11    "? 1966; I think he just quit breathing after Demerol"  . Pacemaker     DOI 2012  . Pneumonia 02/08/11    "walking pneumonia; several years ago"  . Whooping cough     "as a child"  . Cancer   . Arthritis   . DEMENTIA   . Atrioventricular block, complete     History   Social History  . Marital Status: Married    Spouse Name: N/A    Number of Children: N/A  . Years of Education: N/A   Occupational History  . retired    Social History Main Topics  . Smoking status: Former Smoker -- 1.00 packs/day for 71 years    Types: Cigarettes, Cigars   Quit date: 03/16/1991  . Smokeless tobacco: Never Used     Comment: Pt is smoking 2 cigars a day.  . Alcohol Use: Yes     Comment: "used to have glass of wine or beer occasionally; hasn't had one since ~ 2002"  . Drug Use: No  . Sexual Activity: No   Other Topics Concern  . Not on file   Social History Narrative  . No narrative on file    Past Surgical History  Procedure Laterality Date  . Lumbar laminectomy    . Transurethral resection of prostate    . Coronary angioplasty with stent placement    . Cataract extraction w/ intraocular lens  implant, bilateral  ~ 2000  . Back surgery    . Insert / replace / remove pacemaker  09/07/10    initial placement  . Carotid endarterectomy      left    Family History  Problem Relation Age of Onset  . Heart disease Father     Allergies  Allergen Reactions  . Atorvastatin Other (See Comments)    Unknown.  . Demerol [Meperidine]   . Meperidine Hcl Other (See Comments)    Unknown Patient wife states it "knocked him out" years ago    Current Outpatient Prescriptions on File Prior to Visit  Medication Sig Dispense  Refill  . aspirin EC 81 MG tablet Take 81 mg by mouth daily.        . clopidogrel (PLAVIX) 75 MG tablet Take 1 tablet (75 mg total) by mouth daily.  90 tablet  2  . divalproex (DEPAKOTE) 250 MG DR tablet TAKE ONE TABLET BY MOUTH EVERY DAY  90 tablet  2  . donepezil (ARICEPT) 10 MG tablet Take 1 tablet (10 mg total) by mouth daily.  90 tablet  2   No current facility-administered medications on file prior to visit.    BP 110/70  Pulse 67  Temp(Src) 97.9 F (36.6 C) (Oral)  Resp 20  Ht 6' (1.829 m)  Wt 176 lb (79.833 kg)  BMI 23.86 kg/m2  SpO2 97%       Review of Systems  Constitutional: Negative for fever, chills, appetite change and fatigue.  HENT: Negative for congestion, dental problem, ear pain, hearing loss, sore throat, tinnitus, trouble swallowing and voice change.   Eyes: Negative for pain,  discharge and visual disturbance.  Respiratory: Negative for cough, chest tightness, wheezing and stridor.   Cardiovascular: Negative for chest pain, palpitations and leg swelling.  Gastrointestinal: Negative for nausea, vomiting, abdominal pain, diarrhea, constipation, blood in stool and abdominal distention.  Genitourinary: Negative for urgency, hematuria, flank pain, discharge, difficulty urinating and genital sores.  Musculoskeletal: Positive for gait problem. Negative for arthralgias, back pain, joint swelling, myalgias and neck stiffness.  Skin: Negative for rash.  Neurological: Negative for dizziness, syncope, speech difficulty, weakness, numbness and headaches.  Hematological: Negative for adenopathy. Does not bruise/bleed easily.  Psychiatric/Behavioral: Positive for behavioral problems, confusion and agitation. Negative for dysphoric mood. The patient is not nervous/anxious.        Objective:   Physical Exam  Constitutional: He is oriented to person, place, and time. He appears well-developed.  Walks with a walker  HENT:  Head: Normocephalic.  Right Ear: External ear normal.  Left Ear: External ear normal.  Eyes: Conjunctivae and EOM are normal.  Neck: Normal range of motion.  Cardiovascular: Normal rate, regular rhythm and normal heart sounds.   Pulmonary/Chest: Breath sounds normal.  Abdominal: Bowel sounds are normal.  Musculoskeletal: Normal range of motion. He exhibits edema. He exhibits no tenderness.  Neurological: He is alert and oriented to person, place, and time.  Psychiatric: He has a normal mood and affect. His behavior is normal.          Assessment & Plan:   Hypertension well controlled.  Blood pressure low normal today Coronary artery disease, stable Cerebrovascular disease Dementia.  We'll change all medications for the a.m. Only History of statin intolerance  Recheck 6 months or when necessary

## 2013-06-04 NOTE — Progress Notes (Signed)
Pre-visit discussion using our clinic review tool. No additional management support is needed unless otherwise documented below in the visit note.  

## 2013-06-04 NOTE — Patient Instructions (Signed)
Limit your sodium (Salt) intake  Return in 6 months for follow-up  

## 2013-06-12 ENCOUNTER — Encounter: Payer: Self-pay | Admitting: Internal Medicine

## 2013-06-27 ENCOUNTER — Encounter: Payer: Self-pay | Admitting: Internal Medicine

## 2013-07-09 ENCOUNTER — Ambulatory Visit (INDEPENDENT_AMBULATORY_CARE_PROVIDER_SITE_OTHER): Payer: Medicare Other | Admitting: *Deleted

## 2013-07-09 DIAGNOSIS — I442 Atrioventricular block, complete: Secondary | ICD-10-CM

## 2013-07-09 LAB — MDC_IDC_ENUM_SESS_TYPE_REMOTE
Battery Remaining Longevity: 108 mo
Battery Voltage: 2.96 V
Brady Statistic AP VP Percent: 1 %
Brady Statistic AP VS Percent: 16 %
Brady Statistic AS VS Percent: 83 %
Brady Statistic RA Percent Paced: 16 %
Brady Statistic RV Percent Paced: 1 %
Date Time Interrogation Session: 20150427070342
Implantable Pulse Generator Model: 2210
Implantable Pulse Generator Serial Number: 7241194
Lead Channel Impedance Value: 550 Ohm
Lead Channel Pacing Threshold Amplitude: 0.5 V
Lead Channel Pacing Threshold Pulse Width: 0.4 ms
Lead Channel Sensing Intrinsic Amplitude: 12 mV
Lead Channel Sensing Intrinsic Amplitude: 2.2 mV
Lead Channel Setting Pacing Amplitude: 1 V
Lead Channel Setting Sensing Sensitivity: 2 mV
MDC IDC MSMT LEADCHNL RA IMPEDANCE VALUE: 380 Ohm
MDC IDC MSMT LEADCHNL RV PACING THRESHOLD AMPLITUDE: 0.75 V
MDC IDC MSMT LEADCHNL RV PACING THRESHOLD PULSEWIDTH: 0.4 ms
MDC IDC SET LEADCHNL RA PACING AMPLITUDE: 1.5 V
MDC IDC SET LEADCHNL RV PACING PULSEWIDTH: 0.4 ms
MDC IDC STAT BRADY AS VP PERCENT: 1 %

## 2013-07-27 ENCOUNTER — Encounter: Payer: Self-pay | Admitting: Cardiology

## 2013-07-31 ENCOUNTER — Encounter: Payer: Self-pay | Admitting: Internal Medicine

## 2013-08-03 ENCOUNTER — Telehealth: Payer: Self-pay | Admitting: Internal Medicine

## 2013-08-03 NOTE — Telephone Encounter (Signed)
[facility] caslled to fu on a care plan for pt that was sent 5/20. They need back asap and have not heard anything. Requested they refax and will see that you receive it. thanks

## 2013-08-10 NOTE — Telephone Encounter (Signed)
Left message for [name] [name] to call office that they received orders back.

## 2013-08-15 ENCOUNTER — Ambulatory Visit (INDEPENDENT_AMBULATORY_CARE_PROVIDER_SITE_OTHER): Payer: Medicare Other | Admitting: *Deleted

## 2013-08-15 DIAGNOSIS — I495 Sick sinus syndrome: Secondary | ICD-10-CM

## 2013-08-15 LAB — MDC_IDC_ENUM_SESS_TYPE_INCLINIC
Battery Remaining Longevity: 135.6 mo
Battery Voltage: 2.96 V
Brady Statistic RA Percent Paced: 16 %
Brady Statistic RV Percent Paced: 1.1 %
Implantable Pulse Generator Model: 2210
Implantable Pulse Generator Serial Number: 7241194
Lead Channel Impedance Value: 525 Ohm
Lead Channel Pacing Threshold Amplitude: 0.75 V
Lead Channel Pacing Threshold Pulse Width: 0.4 ms
Lead Channel Sensing Intrinsic Amplitude: 12 mV
Lead Channel Setting Pacing Amplitude: 1.5 V
Lead Channel Setting Pacing Pulse Width: 0.4 ms
Lead Channel Setting Sensing Sensitivity: 2 mV
MDC IDC MSMT LEADCHNL RA IMPEDANCE VALUE: 375 Ohm
MDC IDC MSMT LEADCHNL RA PACING THRESHOLD AMPLITUDE: 0.5 V
MDC IDC MSMT LEADCHNL RA PACING THRESHOLD PULSEWIDTH: 0.4 ms
MDC IDC MSMT LEADCHNL RA SENSING INTR AMPL: 3 mV
MDC IDC MSMT LEADCHNL RV PACING THRESHOLD AMPLITUDE: 0.75 V
MDC IDC MSMT LEADCHNL RV PACING THRESHOLD PULSEWIDTH: 0.4 ms
MDC IDC SESS DTM: 20150603203942
MDC IDC SET LEADCHNL RV PACING AMPLITUDE: 2.5 V

## 2013-08-17 NOTE — Progress Notes (Signed)
Pt checked at [facility] due to RV output set to 5.0 V due to autocapture readings. Autocapture turned off due these reading. RV output set to 2.5 @ 0.40. Follow up as planned.

## 2013-09-24 ENCOUNTER — Encounter: Payer: Self-pay | Admitting: Internal Medicine

## 2013-09-26 ENCOUNTER — Encounter: Payer: Self-pay | Admitting: Internal Medicine

## 2013-10-11 ENCOUNTER — Ambulatory Visit (INDEPENDENT_AMBULATORY_CARE_PROVIDER_SITE_OTHER): Payer: Medicare Other | Admitting: *Deleted

## 2013-10-11 DIAGNOSIS — Z950 Presence of cardiac pacemaker: Secondary | ICD-10-CM

## 2013-10-11 DIAGNOSIS — I442 Atrioventricular block, complete: Secondary | ICD-10-CM

## 2013-10-22 ENCOUNTER — Encounter: Payer: Self-pay | Admitting: Internal Medicine

## 2013-10-22 LAB — MDC_IDC_ENUM_SESS_TYPE_REMOTE
Battery Voltage: 2.95 V
Brady Statistic AP VP Percent: 1 %
Brady Statistic AP VS Percent: 16 %
Brady Statistic AS VP Percent: 1 %
Brady Statistic RA Percent Paced: 16 %
Brady Statistic RV Percent Paced: 1 %
Implantable Pulse Generator Serial Number: 7241194
Lead Channel Impedance Value: 390 Ohm
Lead Channel Impedance Value: 550 Ohm
Lead Channel Pacing Threshold Pulse Width: 0.4 ms
Lead Channel Pacing Threshold Pulse Width: 0.4 ms
Lead Channel Sensing Intrinsic Amplitude: 12 mV
Lead Channel Sensing Intrinsic Amplitude: 4.8 mV
Lead Channel Setting Pacing Amplitude: 2.5 V
Lead Channel Setting Pacing Pulse Width: 0.4 ms
Lead Channel Setting Sensing Sensitivity: 2 mV
MDC IDC MSMT BATTERY REMAINING LONGEVITY: 97 mo
MDC IDC MSMT BATTERY REMAINING PERCENTAGE: 74 %
MDC IDC MSMT LEADCHNL RA PACING THRESHOLD AMPLITUDE: 0.625 V
MDC IDC MSMT LEADCHNL RV PACING THRESHOLD AMPLITUDE: 0.75 V
MDC IDC SESS DTM: 20150730193504
MDC IDC SET LEADCHNL RA PACING AMPLITUDE: 1.625
MDC IDC STAT BRADY AS VS PERCENT: 83 %

## 2013-11-06 ENCOUNTER — Encounter: Payer: Self-pay | Admitting: Cardiology

## 2013-11-14 ENCOUNTER — Encounter: Payer: Self-pay | Admitting: Internal Medicine

## 2013-11-26 ENCOUNTER — Encounter: Payer: Medicare Other | Admitting: Cardiovascular Disease

## 2013-11-26 NOTE — Progress Notes (Signed)
n

## 2013-12-05 ENCOUNTER — Ambulatory Visit (INDEPENDENT_AMBULATORY_CARE_PROVIDER_SITE_OTHER): Payer: Medicare Other | Admitting: Internal Medicine

## 2013-12-05 ENCOUNTER — Encounter: Payer: Self-pay | Admitting: Internal Medicine

## 2013-12-05 VITALS — BP 110/70 | HR 72 | Temp 97.7°F | Resp 20 | Ht 72.0 in | Wt 180.0 lb

## 2013-12-05 DIAGNOSIS — R413 Other amnesia: Secondary | ICD-10-CM

## 2013-12-05 DIAGNOSIS — I10 Essential (primary) hypertension: Secondary | ICD-10-CM

## 2013-12-05 DIAGNOSIS — I6990 Unspecified sequelae of unspecified cerebrovascular disease: Secondary | ICD-10-CM

## 2013-12-05 DIAGNOSIS — I2510 Atherosclerotic heart disease of native coronary artery without angina pectoris: Secondary | ICD-10-CM

## 2013-12-05 DIAGNOSIS — M353 Polymyalgia rheumatica: Secondary | ICD-10-CM

## 2013-12-05 DIAGNOSIS — E785 Hyperlipidemia, unspecified: Secondary | ICD-10-CM

## 2013-12-05 DIAGNOSIS — Z23 Encounter for immunization: Secondary | ICD-10-CM

## 2013-12-05 LAB — COMPREHENSIVE METABOLIC PANEL
ALBUMIN: 3.8 g/dL (ref 3.5–5.2)
ALK PHOS: 41 U/L (ref 39–117)
ALT: 13 U/L (ref 0–53)
AST: 20 U/L (ref 0–37)
BUN: 11 mg/dL (ref 6–23)
CHLORIDE: 103 meq/L (ref 96–112)
CO2: 29 mEq/L (ref 19–32)
Calcium: 8.8 mg/dL (ref 8.4–10.5)
Creatinine, Ser: 1.2 mg/dL (ref 0.4–1.5)
GFR: 62.11 mL/min (ref >60.00)
GLUCOSE: 116 mg/dL — AB (ref 70–99)
POTASSIUM: 4.1 meq/L (ref 3.5–5.1)
SODIUM: 137 meq/L (ref 135–145)
TOTAL PROTEIN: 6.6 g/dL (ref 6.0–8.3)
Total Bilirubin: 0.6 mg/dL (ref 0.2–1.2)

## 2013-12-05 LAB — CBC WITH DIFFERENTIAL/PLATELET
BASOS ABS: 0 10*3/uL (ref 0.0–0.1)
Basophils Relative: 0.5 % (ref 0.0–3.0)
EOS ABS: 0.2 10*3/uL (ref 0.0–0.7)
Eosinophils Relative: 2.6 % (ref 0.0–5.0)
HCT: 38 % — ABNORMAL LOW (ref 39.0–52.0)
Hemoglobin: 12.5 g/dL — ABNORMAL LOW (ref 13.0–17.0)
Lymphocytes Relative: 30.1 % (ref 12.0–46.0)
Lymphs Abs: 2 10*3/uL (ref 0.7–4.0)
MCHC: 33 g/dL (ref 30.0–36.0)
MCV: 91 fl (ref 78.0–100.0)
MONO ABS: 0.6 10*3/uL (ref 0.1–1.0)
Monocytes Relative: 9.3 % (ref 3.0–12.0)
Neutro Abs: 3.8 10*3/uL (ref 1.4–7.7)
Neutrophils Relative %: 57.5 % (ref 43.0–77.0)
PLATELETS: 192 10*3/uL (ref 150.0–400.0)
RBC: 4.18 Mil/uL — ABNORMAL LOW (ref 4.22–5.81)
RDW: 14.8 % (ref 11.5–15.5)
WBC: 6.6 10*3/uL (ref 4.0–10.5)

## 2013-12-05 LAB — SEDIMENTATION RATE: Sed Rate: 4 mm/hr (ref 0–22)

## 2013-12-05 LAB — TSH: TSH: 2.28 u[IU]/mL (ref 0.35–4.50)

## 2013-12-05 MED ORDER — CLONAZEPAM 0.5 MG PO TBDP: ORAL_TABLET | ORAL | Status: DC

## 2013-12-05 MED ORDER — CLONAZEPAM 0.5 MG PO TABS: ORAL_TABLET | ORAL | Status: DC

## 2013-12-05 NOTE — Progress Notes (Signed)
Pre visit review using our clinic review tool, if applicable. No additional management support is needed unless otherwise documented below in the visit note. 

## 2013-12-05 NOTE — Patient Instructions (Signed)
Limit your sodium (Salt) intake  Return in 6 months for follow-up  

## 2013-12-05 NOTE — Progress Notes (Signed)
Subjective:    Patient ID: [name] [name], male    DOB: 07-03-1925, 80 y.o.   MRN: [id number]  HPI 80 year old patient who has a history of coronary artery and cerebrovascular disease.  He is status post pacemaker insertion for complete heart block.  He has hypertension, dyslipidemia, and a history of significant dementia.  He has a remote history of polymyalgia  rheumatica.  He has moderate COPD. He resides in an extended care facility.  He apparent has had some mild insomnia recently, but he denies this today. His cardiac component status appear to be stable. No recent lab Past Medical History  Diagnosis Date  . Pacemaker 08/09/2006    family denies this history  . Complete heart block 11/21/2008    intermittent  . BENIGN PROSTATIC HYPERTROPHY 08/09/2006  . CEREBROVASCULAR ACCIDENT, HX OF 08/09/2006  . COLONIC POLYPS, HX OF 08/09/2006  . COPD 01/13/2009  . CORONARY ARTERY DISEASE 01/12/2008    Drug eluting stent Circumflex 2009;  LHC 02/08/11:  CFX stent patent, LVF normal, left ventricle "spade-shaped" consistent with hypertensive heart disease  . GERD 08/09/2006  . HYPERLIPIDEMIA 08/09/2006  . HYPERTENSION, BENIGN 08/13/2008  . MELENA, HX OF 02/01/2008  . PHLEBITIS, LOWER EXTREMITY 06/08/2007  . Polymyalgia rheumatica 08/09/2006  . SYNCOPE 12/25/2008    Secondary heart block  . VENTRICULAR TACHYCARDIA 01/12/2008    Nonsustained  . WEAKNESS 11/21/2008  . Complication of anesthesia 02/08/11    "? 1966; I think he just quit breathing after Demerol"  . Pacemaker     DOI 2012  . Pneumonia 02/08/11    "walking pneumonia; several years ago"  . Whooping cough     "as a child"  . Cancer   . Arthritis   . DEMENTIA   . Atrioventricular block, complete     History   Social History  . Marital Status: Married    Spouse Name: N/A    Number of Children: N/A  . Years of Education: N/A   Occupational History  . retired    Social History Main Topics  . Smoking status: Former Smoker --  1.00 packs/day for 71 years    Types: Cigarettes, Cigars    Quit date: 03/16/1991  . Smokeless tobacco: Never Used     Comment: Pt is smoking 2 cigars a day.  . Alcohol Use: Yes     Comment: "used to have glass of wine or beer occasionally; hasn't had one since ~ 2002"  . Drug Use: No  . Sexual Activity: No   Other Topics Concern  . Not on file   Social History Narrative  . No narrative on file    Past Surgical History  Procedure Laterality Date  . Lumbar laminectomy    . Transurethral resection of prostate    . Coronary angioplasty with stent placement    . Cataract extraction w/ intraocular lens  implant, bilateral  ~ 2000  . Back surgery    . Insert / replace / remove pacemaker  09/07/10    initial placement  . Carotid endarterectomy      left    Family History  Problem Relation Age of Onset  . Heart disease Father     Allergies  Allergen Reactions  . Atorvastatin Other (See Comments)    Unknown.  . Demerol [Meperidine]   . Meperidine Hcl Other (See Comments)    Unknown Patient wife states it "knocked him out" years ago    Current Outpatient Prescriptions on File Prior to  Visit  Medication Sig Dispense Refill  . aspirin EC 81 MG tablet Take 81 mg by mouth daily.        . clonazePAM (KLONOPIN) 0.5 MG tablet 1 half tablet every morning, only  30 tablet  0  . clopidogrel (PLAVIX) 75 MG tablet Take 1 tablet (75 mg total) by mouth daily.  90 tablet  2  . divalproex (DEPAKOTE) 250 MG DR tablet TAKE ONE TABLET BY MOUTH EVERY DAY  90 tablet  2  . donepezil (ARICEPT) 10 MG tablet Take 1 tablet (10 mg total) by mouth daily.  90 tablet  2  . metoprolol tartrate (LOPRESSOR) 25 MG tablet 1 tablet every morning  90 tablet  6  . pravastatin (PRAVACHOL) 40 MG tablet Take 1 tablet (40 mg total) by mouth daily.  90 tablet  2  . QUEtiapine (SEROQUEL) 25 MG tablet 1 tablet every morning, only  180 tablet  2  . ranitidine (ZANTAC) 150 MG tablet 1 tablet daily       No current  facility-administered medications on file prior to visit.    BP 110/70  Pulse 72  Temp(Src) 97.7 F (36.5 C) (Oral)  Resp 20  Ht 6' (1.829 m)  Wt 180 lb (81.647 kg)  BMI 24.41 kg/m2  SpO2 97%       Review of Systems  Constitutional: Negative for fever, chills, appetite change and fatigue.  HENT: Negative for congestion, dental problem, ear pain, hearing loss, sore throat, tinnitus, trouble swallowing and voice change.   Eyes: Negative for pain, discharge and visual disturbance.  Respiratory: Negative for cough, chest tightness, wheezing and stridor.   Cardiovascular: Negative for chest pain, palpitations and leg swelling.  Gastrointestinal: Negative for nausea, vomiting, abdominal pain, diarrhea, constipation, blood in stool and abdominal distention.  Genitourinary: Negative for urgency, hematuria, flank pain, discharge, difficulty urinating and genital sores.  Musculoskeletal: Negative for arthralgias, back pain, gait problem, joint swelling, myalgias and neck stiffness.  Skin: Negative for rash.  Neurological: Negative for dizziness, syncope, speech difficulty, weakness, numbness and headaches.  Hematological: Negative for adenopathy. Does not bruise/bleed easily.  Psychiatric/Behavioral: Positive for confusion and sleep disturbance. Negative for behavioral problems and dysphoric mood. The patient is not nervous/anxious.        Objective:   Physical Exam  Constitutional: He is oriented to person, place, and time. He appears well-developed.  HENT:  Head: Normocephalic.  Right Ear: External ear normal.  Left Ear: External ear normal.  Eyes: Conjunctivae and EOM are normal.  Neck: Normal range of motion.  Cardiovascular: Normal rate and normal heart sounds.   Pulmonary/Chest: Breath sounds normal.  Few crackles, right base  Abdominal: Bowel sounds are normal.  Musculoskeletal: Normal range of motion. He exhibits no edema and no tenderness.  Neurological: He is alert and  oriented to person, place, and time.  Psychiatric: He has a normal mood and affect. His behavior is normal.          Assessment & Plan:  COPD Dementia Episodic insomnia.  We'll use clonazepam at bedtime when necessary, in addition to his daily morning dose Coronary artery disease, stable  We'll check updated labs, including sedimentation rate Recheck in 6 months Flu vaccine administered

## 2013-12-10 ENCOUNTER — Encounter: Payer: Self-pay | Admitting: Internal Medicine

## 2014-01-04 ENCOUNTER — Encounter: Payer: Medicare Other | Admitting: Internal Medicine

## 2014-01-08 ENCOUNTER — Encounter: Payer: Self-pay | Admitting: Internal Medicine

## 2014-01-09 ENCOUNTER — Telehealth: Payer: Self-pay | Admitting: Internal Medicine

## 2014-01-09 MED ORDER — TRAZODONE HCL 50 MG PO TABS: 25.0000 mg | ORAL_TABLET | Freq: Every day | ORAL | Status: DC

## 2014-01-09 NOTE — Telephone Encounter (Signed)
Please see message and advise 

## 2014-01-09 NOTE — Telephone Encounter (Signed)
Please call in a new prescription for trazodone 25 mg at bedtime

## 2014-01-09 NOTE — Telephone Encounter (Signed)
Left detailed message for pt's wife that Rx to help with sleep was faxed to [facility]. Rx for Trazodone faxed.

## 2014-01-09 NOTE — Telephone Encounter (Signed)
Pt wife is calling to let md know her husband sleeping in daytime and up all night long. Pt has alz pt at [facility] phone # [phone]

## 2014-01-17 ENCOUNTER — Telehealth: Payer: Self-pay | Admitting: Internal Medicine

## 2014-01-17 MED ORDER — CLONAZEPAM 0.5 MG PO TBDP: ORAL_TABLET | ORAL | Status: DC

## 2014-01-17 NOTE — Telephone Encounter (Signed)
Rx faxed

## 2014-01-17 NOTE — Telephone Encounter (Signed)
[name] [name] at [facility] need a hard script for clonazePAM (KLONOPIN) 0.5 MG disintegrating tablet faxed to [phone].

## 2014-01-31 ENCOUNTER — Ambulatory Visit (INDEPENDENT_AMBULATORY_CARE_PROVIDER_SITE_OTHER): Payer: Medicare Other | Admitting: Internal Medicine

## 2014-01-31 ENCOUNTER — Encounter: Payer: Self-pay | Admitting: Internal Medicine

## 2014-01-31 VITALS — BP 126/78 | HR 70 | Ht 72.0 in | Wt 184.0 lb

## 2014-01-31 DIAGNOSIS — I495 Sick sinus syndrome: Secondary | ICD-10-CM

## 2014-01-31 DIAGNOSIS — I2510 Atherosclerotic heart disease of native coronary artery without angina pectoris: Secondary | ICD-10-CM

## 2014-01-31 DIAGNOSIS — Z955 Presence of coronary angioplasty implant and graft: Secondary | ICD-10-CM

## 2014-01-31 DIAGNOSIS — Z45018 Encounter for adjustment and management of other part of cardiac pacemaker: Secondary | ICD-10-CM

## 2014-01-31 DIAGNOSIS — I480 Paroxysmal atrial fibrillation: Secondary | ICD-10-CM

## 2014-01-31 DIAGNOSIS — I442 Atrioventricular block, complete: Secondary | ICD-10-CM

## 2014-01-31 LAB — MDC_IDC_ENUM_SESS_TYPE_INCLINIC
Battery Remaining Longevity: 127.2 mo
Brady Statistic RA Percent Paced: 17 %
Date Time Interrogation Session: 20151119151611
Implantable Pulse Generator Model: 2210
Implantable Pulse Generator Serial Number: 7241194
Lead Channel Impedance Value: 375 Ohm
Lead Channel Impedance Value: 587.5 Ohm
Lead Channel Pacing Threshold Pulse Width: 0.4 ms
Lead Channel Pacing Threshold Pulse Width: 0.4 ms
Lead Channel Sensing Intrinsic Amplitude: 12 mV
Lead Channel Setting Pacing Amplitude: 1.75 V
Lead Channel Setting Sensing Sensitivity: 2 mV
MDC IDC MSMT BATTERY VOLTAGE: 2.95 V
MDC IDC MSMT LEADCHNL RA PACING THRESHOLD AMPLITUDE: 0.75 V
MDC IDC MSMT LEADCHNL RA SENSING INTR AMPL: 2.3 mV
MDC IDC MSMT LEADCHNL RV PACING THRESHOLD AMPLITUDE: 0.75 V
MDC IDC SET LEADCHNL RV PACING AMPLITUDE: 2.5 V
MDC IDC SET LEADCHNL RV PACING PULSEWIDTH: 0.4 ms
MDC IDC STAT BRADY RV PERCENT PACED: 0.58 %

## 2014-01-31 NOTE — Patient Instructions (Signed)
Your physician recommends that you continue on your current medications as directed. Please refer to the Current Medication list given to you today.  Your physician wants you to follow-up in: 1 year with Dr. Klein.  You will receive a reminder letter in the mail two months in advance. If you don't receive a letter, please call our office to schedule the follow-up appointment.  

## 2014-01-31 NOTE — Progress Notes (Signed)
Patient Care Team: Marletta Lor, MD as PCP - General Burnell Blanks, MD as Consulting Physician (Cardiology)   HPI  [name] [name] is a 80 y.o. male Seen in followup for intermittent complete heart block for which  he underwent pacing spring 2012.  He has a history of coronary artery disease with prior circumflex stenting. Echo March 2013 demonstrated normal left ventricular function without significant valvular issues.   Myoview October 2011 demonstrated no ischemia     The patient denies chest pain, shortness of breath, nocturnal dyspnea, orthopnea or peripheral edema. T  His memory continues to be an issue. His irritability remains an issue also. He has been moved [facility]  Past Medical History  Diagnosis Date  . Pacemaker 08/09/2006    family denies this history  . Complete heart block 11/21/2008    intermittent  . BENIGN PROSTATIC HYPERTROPHY 08/09/2006  . CEREBROVASCULAR ACCIDENT, HX OF 08/09/2006  . COLONIC POLYPS, HX OF 08/09/2006  . COPD 01/13/2009  . CORONARY ARTERY DISEASE 01/12/2008    Drug eluting stent Circumflex 2009;  LHC 02/08/11:  CFX stent patent, LVF normal, left ventricle "spade-shaped" consistent with hypertensive heart disease  . GERD 08/09/2006  . HYPERLIPIDEMIA 08/09/2006  . HYPERTENSION, BENIGN 08/13/2008  . MELENA, HX OF 02/01/2008  . PHLEBITIS, LOWER EXTREMITY 06/08/2007  . Polymyalgia rheumatica 08/09/2006  . SYNCOPE 12/25/2008    Secondary heart block  . VENTRICULAR TACHYCARDIA 01/12/2008    Nonsustained  . WEAKNESS 11/21/2008  . Complication of anesthesia 02/08/11    "? 1966; I think he just quit breathing after Demerol"  . Pacemaker     DOI 2012  . Pneumonia 02/08/11    "walking pneumonia; several years ago"  . Whooping cough     "as a child"  . Cancer   . Arthritis   . DEMENTIA   . Atrioventricular block, complete     Past Surgical History  Procedure Laterality Date  . Lumbar laminectomy    . Transurethral  resection of prostate    . Coronary angioplasty with stent placement    . Cataract extraction w/ intraocular lens  implant, bilateral  ~ 2000  . Back surgery    . Insert / replace / remove pacemaker  09/07/10    initial placement  . Carotid endarterectomy      left    Current Outpatient Prescriptions  Medication Sig Dispense Refill  . aspirin EC 81 MG tablet Take 81 mg by mouth daily.      . clonazePAM (KLONOPIN) 0.5 MG disintegrating tablet 1 half tablet every morning May repeat one half tablet at bedtime if needed for insomnia 60 tablet 5  . clopidogrel (PLAVIX) 75 MG tablet Take 1 tablet (75 mg total) by mouth daily. 90 tablet 2  . divalproex (DEPAKOTE) 250 MG DR tablet TAKE ONE TABLET BY MOUTH EVERY DAY 90 tablet 2  . donepezil (ARICEPT) 10 MG tablet Take 1 tablet (10 mg total) by mouth daily. 90 tablet 2  . metoprolol tartrate (LOPRESSOR) 25 MG tablet 1 tablet every morning 90 tablet 6  . pravastatin (PRAVACHOL) 40 MG tablet Take 1 tablet (40 mg total) by mouth daily. 90 tablet 2  . QUEtiapine (SEROQUEL) 25 MG tablet 1 tablet every morning, only 180 tablet 2  . ranitidine (ZANTAC) 150 MG tablet 1 tablet daily    . traZODone (DESYREL) 50 MG tablet Take 0.5 tablets (25 mg total) by mouth at bedtime. 90 tablet 1  No current facility-administered medications for this visit.    Allergies  Allergen Reactions  . Atorvastatin Other (See Comments)    Unknown.  . Demerol [Meperidine]   . Meperidine Hcl Other (See Comments)    Unknown Patient wife states it "knocked him out" years ago    Review of Systems negative except from HPI and PMH  Physical Exam BP 126/78 mmHg  Pulse 70  Ht 6' (1.829 m)  Wt 83.462 kg (184 lb)  BMI 24.95 kg/m2  Well developed and nourished in no acute distress HENT normal Neck supple with JVP-flat Clear Regular rate and rhythm, no murmurs or gallops Abd-soft with active BS No Clubbing cyanosis edema Skin-warm and dry A & Oriented  Grossly normal  sensory and motor function  ECG A apcing with narrow QRs  Assessment and  Plan  Sinus node dysfunction  Pacemaker-[employer] Jude he  Atrial fibrillation-paroxysmal  Coronary artery disease with prior stenting  Dementia  The patient has had a 16 minute episode of atrial fibrillation. I don't think this justify the use of anticoagulation at this point. We'll continue him on aspirin/Plavix  We'll increase his metoprolol from 25 every morning 2 twice a day atrial fibrillation was associated with a rapid ventricular response

## 2014-01-31 NOTE — Addendum Note (Signed)
Addended by: Stanton Kidney on: 01/31/2014 03:27 PM   Modules accepted: Level of Service

## 2014-02-20 ENCOUNTER — Encounter (HOSPITAL_COMMUNITY): Payer: Self-pay | Admitting: Cardiovascular Disease

## 2014-05-02 ENCOUNTER — Telehealth: Payer: Self-pay | Admitting: Cardiology

## 2014-05-02 ENCOUNTER — Encounter: Payer: Medicare Other | Admitting: *Deleted

## 2014-05-02 NOTE — Telephone Encounter (Signed)
LMOVM reminding pt to send remote transmission.   

## 2014-05-03 ENCOUNTER — Encounter: Payer: Self-pay | Admitting: Cardiology

## 2014-06-05 ENCOUNTER — Encounter: Payer: Self-pay | Admitting: Internal Medicine

## 2014-06-05 ENCOUNTER — Ambulatory Visit (INDEPENDENT_AMBULATORY_CARE_PROVIDER_SITE_OTHER): Payer: Medicare Other | Admitting: Internal Medicine

## 2014-06-05 VITALS — BP 120/80 | HR 73 | Temp 98.4°F | Resp 20 | Ht 72.0 in | Wt 184.0 lb

## 2014-06-05 DIAGNOSIS — M353 Polymyalgia rheumatica: Secondary | ICD-10-CM

## 2014-06-05 DIAGNOSIS — I10 Essential (primary) hypertension: Secondary | ICD-10-CM | POA: Diagnosis not present

## 2014-06-05 DIAGNOSIS — I2510 Atherosclerotic heart disease of native coronary artery without angina pectoris: Secondary | ICD-10-CM

## 2014-06-05 DIAGNOSIS — I472 Ventricular tachycardia: Secondary | ICD-10-CM | POA: Diagnosis not present

## 2014-06-05 DIAGNOSIS — R413 Other amnesia: Secondary | ICD-10-CM

## 2014-06-05 DIAGNOSIS — J449 Chronic obstructive pulmonary disease, unspecified: Secondary | ICD-10-CM

## 2014-06-05 DIAGNOSIS — I4729 Other ventricular tachycardia: Secondary | ICD-10-CM

## 2014-06-05 NOTE — Patient Instructions (Addendum)
Limit your sodium (Salt) intake  Return in 6 months for follow-up  Follow-up cardiology as scheduled

## 2014-06-05 NOTE — Progress Notes (Signed)
Pre visit review using our clinic review tool, if applicable. No additional management support is needed unless otherwise documented below in the visit note. 

## 2014-06-05 NOTE — Progress Notes (Signed)
Subjective:    Patient ID: [name] [name], male    DOB: 1925-05-21, 81 y.o.   MRN: [id number]  HPI  Wt Readings from Last 3 Encounters:  06/05/14 184 lb (83.462 kg)  01/31/14 184 lb (83.462 kg)  12/05/13 180 lb (81.32 kg)   81 year old patient with multiple medical problems who resides at an extended care facility.  He has a history of hypertension which has been stable.  He has moderate dementia which has been fairly  nonprogressive.  He has coronary artery disease complicated by complete AV block and history of ventricular tachycardia and syncope.  He has a history of cerebrovascular disease and moderate COPD.  His cardiopulmonary status has been stable.  He is accompanied by his wife.   Past Medical History  Diagnosis Date  . Pacemaker 08/09/2006    family denies this history  . Complete heart block 11/21/2008    intermittent  . BENIGN PROSTATIC HYPERTROPHY 08/09/2006  . CEREBROVASCULAR ACCIDENT, HX OF 08/09/2006  . COLONIC POLYPS, HX OF 08/09/2006  . COPD 01/13/2009  . CORONARY ARTERY DISEASE 01/12/2008    Drug eluting stent Circumflex 2009;  LHC 02/08/11:  CFX stent patent, LVF normal, left ventricle "spade-shaped" consistent with hypertensive heart disease  . GERD 08/09/2006  . HYPERLIPIDEMIA 08/09/2006  . HYPERTENSION, BENIGN 08/13/2008  . MELENA, HX OF 02/01/2008  . PHLEBITIS, LOWER EXTREMITY 06/08/2007  . Polymyalgia rheumatica 08/09/2006  . SYNCOPE 12/25/2008    Secondary heart block  . VENTRICULAR TACHYCARDIA 01/12/2008    Nonsustained  . WEAKNESS 11/21/2008  . Complication of anesthesia 02/08/11    "? 1966; I think he just quit breathing after Demerol"  . Pacemaker     DOI 2012  . Pneumonia 02/08/11    "walking pneumonia; several years ago"  . Whooping cough     "as a child"  . Cancer   . Arthritis   . DEMENTIA   . Atrioventricular block, complete     History   Social History  . Marital Status: Married    Spouse Name: N/A  . Number of Children: N/A  . Years of  Education: N/A   Occupational History  . retired    Social History Main Topics  . Smoking status: Former Smoker -- 1.00 packs/day for 71 years    Types: Cigarettes, Cigars    Quit date: 03/16/1991  . Smokeless tobacco: Never Used     Comment: Pt is smoking 2 cigars a day.  . Alcohol Use: Yes     Comment: "used to have glass of wine or beer occasionally; hasn't had one since ~ 2002"  . Drug Use: No  . Sexual Activity: No   Other Topics Concern  . Not on file   Social History Narrative    Past Surgical History  Procedure Laterality Date  . Lumbar laminectomy    . Transurethral resection of prostate    . Coronary angioplasty with stent placement    . Cataract extraction w/ intraocular lens  implant, bilateral  ~ 2000  . Back surgery    . Insert / replace / remove pacemaker  09/07/10    initial placement  . Carotid endarterectomy      left  . Left heart catheterization with coronary angiogram N/A 02/08/2011    Procedure: LEFT HEART CATHETERIZATION WITH CORONARY ANGIOGRAM;  Surgeon: Thayer Headings, MD;  Location: [facility] CATH LAB;  Service: Cardiovascular;  Laterality: N/A;    Family History  Problem Relation Age of Onset  .  Heart disease Father     Allergies  Allergen Reactions  . Atorvastatin Other (See Comments)    Unknown.  . Demerol [Meperidine]   . Meperidine Hcl Other (See Comments)    Unknown Patient wife states it "knocked him out" years ago    Current Outpatient Prescriptions on File Prior to Visit  Medication Sig Dispense Refill  . aspirin EC 81 MG tablet Take 81 mg by mouth daily.      . clonazePAM (KLONOPIN) 0.5 MG disintegrating tablet 1 half tablet every morning May repeat one half tablet at bedtime if needed for insomnia 60 tablet 5  . clopidogrel (PLAVIX) 75 MG tablet Take 1 tablet (75 mg total) by mouth daily. 90 tablet 2  . divalproex (DEPAKOTE) 250 MG DR tablet TAKE ONE TABLET BY MOUTH EVERY DAY 90 tablet 2  . donepezil (ARICEPT) 10 MG tablet Take  1 tablet (10 mg total) by mouth daily. 90 tablet 2  . metoprolol tartrate (LOPRESSOR) 25 MG tablet 1 tablet every morning 90 tablet 6  . pravastatin (PRAVACHOL) 40 MG tablet Take 1 tablet (40 mg total) by mouth daily. 90 tablet 2  . QUEtiapine (SEROQUEL) 25 MG tablet 1 tablet every morning, only 180 tablet 2  . ranitidine (ZANTAC) 150 MG tablet 1 tablet daily    . traZODone (DESYREL) 50 MG tablet Take 0.5 tablets (25 mg total) by mouth at bedtime. 90 tablet 1   No current facility-administered medications on file prior to visit.    BP 120/80 mmHg  Pulse 73  Temp(Src) 98.4 F (36.9 C) (Oral)  Resp 20  Ht 6' (1.829 m)  Wt 184 lb (83.462 kg)  BMI 24.95 kg/m2  SpO2 98%    Review of Systems  Constitutional: Negative for fever, chills, appetite change and fatigue.  HENT: Negative for congestion, dental problem, ear pain, hearing loss, sore throat, tinnitus, trouble swallowing and voice change.   Eyes: Negative for pain, discharge and visual disturbance.  Respiratory: Negative for cough, chest tightness, wheezing and stridor.   Cardiovascular: Negative for chest pain, palpitations and leg swelling.  Gastrointestinal: Negative for nausea, vomiting, abdominal pain, diarrhea, constipation, blood in stool and abdominal distention.  Genitourinary: Negative for urgency, hematuria, flank pain, discharge, difficulty urinating and genital sores.  Musculoskeletal: Negative for myalgias, back pain, joint swelling, arthralgias, gait problem and neck stiffness.  Skin: Negative for rash.  Neurological: Negative for dizziness, syncope, speech difficulty, weakness, numbness and headaches.  Hematological: Negative for adenopathy. Does not bruise/bleed easily.  Psychiatric/Behavioral: Positive for behavioral problems, confusion, decreased concentration and agitation. Negative for dysphoric mood. The patient is not nervous/anxious.        Objective:   Physical Exam  Constitutional: He is oriented to  person, place, and time. He appears well-developed.  HENT:  Head: Normocephalic.  Right Ear: External ear normal.  Left Ear: External ear normal.  Eyes: Conjunctivae and EOM are normal.  Neck: Normal range of motion.  Cardiovascular: Normal rate and normal heart sounds.   Pulmonary/Chest: Breath sounds normal.  Abdominal: Bowel sounds are normal.  Musculoskeletal: Normal range of motion. He exhibits no edema or tenderness.  Neurological: He is alert and oriented to person, place, and time.  Skin: Skin is dry.  Dry flaky skin involving the lower extremities  Psychiatric: He has a normal mood and affect. His behavior is normal.          Assessment & Plan:    Essential hypertension, well-controlled Dementia, stable and fairly nonprogressive Coronary artery  disease, stable Moderate COPD stable.  History of polymyalgia rheumatica Cerebrovascular disease.  We'll continue Plavix and aspirin therapy  CPX 3-6 months

## 2014-06-06 ENCOUNTER — Encounter: Payer: Self-pay | Admitting: Internal Medicine

## 2014-06-28 ENCOUNTER — Ambulatory Visit (INDEPENDENT_AMBULATORY_CARE_PROVIDER_SITE_OTHER): Payer: Medicare Other | Admitting: *Deleted

## 2014-06-28 DIAGNOSIS — I442 Atrioventricular block, complete: Secondary | ICD-10-CM | POA: Diagnosis not present

## 2014-06-28 NOTE — Progress Notes (Signed)
Remote pacemaker transmission.   

## 2014-06-30 LAB — MDC_IDC_ENUM_SESS_TYPE_REMOTE
Battery Remaining Longevity: 91 mo
Battery Remaining Percentage: 70 %
Battery Voltage: 2.95 V
Brady Statistic AS VP Percent: 1 %
Brady Statistic AS VS Percent: 66 %
Brady Statistic RA Percent Paced: 32 %
Lead Channel Impedance Value: 410 Ohm
Lead Channel Impedance Value: 550 Ohm
Lead Channel Pacing Threshold Amplitude: 0.625 V
Lead Channel Pacing Threshold Pulse Width: 0.4 ms
Lead Channel Sensing Intrinsic Amplitude: 4.2 mV
Lead Channel Setting Pacing Amplitude: 1.625
Lead Channel Setting Pacing Amplitude: 2.5 V
Lead Channel Setting Sensing Sensitivity: 2 mV
MDC IDC MSMT LEADCHNL RV SENSING INTR AMPL: 12 mV
MDC IDC PG SERIAL: 7241194
MDC IDC SESS DTM: 20160415050227
MDC IDC SET LEADCHNL RV PACING PULSEWIDTH: 0.4 ms
MDC IDC STAT BRADY AP VP PERCENT: 1 %
MDC IDC STAT BRADY AP VS PERCENT: 33 %
MDC IDC STAT BRADY RV PERCENT PACED: 1.1 %

## 2014-07-23 ENCOUNTER — Encounter: Payer: Self-pay | Admitting: Cardiology

## 2014-07-25 ENCOUNTER — Encounter: Payer: Self-pay | Admitting: Internal Medicine

## 2014-07-30 ENCOUNTER — Other Ambulatory Visit: Payer: Self-pay | Admitting: *Deleted

## 2014-08-01 MED ORDER — CLONAZEPAM 0.5 MG PO TBDP: ORAL_TABLET | ORAL | Status: DC

## 2014-09-03 ENCOUNTER — Encounter: Payer: Self-pay | Admitting: Internal Medicine

## 2014-09-30 ENCOUNTER — Ambulatory Visit (INDEPENDENT_AMBULATORY_CARE_PROVIDER_SITE_OTHER): Payer: Medicare Other | Admitting: *Deleted

## 2014-09-30 ENCOUNTER — Telehealth: Payer: Self-pay | Admitting: Cardiology

## 2014-09-30 DIAGNOSIS — I495 Sick sinus syndrome: Secondary | ICD-10-CM

## 2014-09-30 NOTE — Telephone Encounter (Signed)
LMOVM reminding pt to send remote transmission.   

## 2014-10-01 ENCOUNTER — Encounter: Payer: Self-pay | Admitting: Internal Medicine

## 2014-10-01 DIAGNOSIS — I495 Sick sinus syndrome: Secondary | ICD-10-CM

## 2014-10-01 NOTE — Progress Notes (Signed)
Remote pacemaker transmission.   

## 2014-10-02 LAB — CUP PACEART REMOTE DEVICE CHECK
Brady Statistic AS VP Percent: 1 %
Brady Statistic RA Percent Paced: 34 %
Brady Statistic RV Percent Paced: 1.2 %
Lead Channel Impedance Value: 410 Ohm
Lead Channel Impedance Value: 550 Ohm
Lead Channel Pacing Threshold Amplitude: 0.625 V
Lead Channel Pacing Threshold Amplitude: 0.75 V
Lead Channel Pacing Threshold Pulse Width: 0.4 ms
Lead Channel Sensing Intrinsic Amplitude: 12 mV
Lead Channel Sensing Intrinsic Amplitude: 2.1 mV
Lead Channel Setting Pacing Amplitude: 1.625
Lead Channel Setting Pacing Amplitude: 2.5 V
Lead Channel Setting Pacing Pulse Width: 0.4 ms
MDC IDC MSMT BATTERY REMAINING LONGEVITY: 116 mo
MDC IDC MSMT BATTERY REMAINING PERCENTAGE: 91 %
MDC IDC MSMT BATTERY VOLTAGE: 2.95 V
MDC IDC MSMT LEADCHNL RA PACING THRESHOLD PULSEWIDTH: 0.4 ms
MDC IDC SESS DTM: 20160719053953
MDC IDC SET LEADCHNL RV SENSING SENSITIVITY: 2 mV
MDC IDC STAT BRADY AP VP PERCENT: 1.1 %
MDC IDC STAT BRADY AP VS PERCENT: 34 %
MDC IDC STAT BRADY AS VS PERCENT: 64 %
Pulse Gen Serial Number: 7241194

## 2014-10-23 ENCOUNTER — Encounter: Payer: Self-pay | Admitting: Cardiology

## 2014-10-28 ENCOUNTER — Telehealth: Payer: Self-pay | Admitting: Internal Medicine

## 2014-10-29 NOTE — Telephone Encounter (Signed)
Appears to be [facility] patient.

## 2014-10-30 NOTE — Telephone Encounter (Signed)
The wife of the patient is requesting samples for her not for her husband.  She is advised that I will have someone return her call about samples.

## 2014-12-04 ENCOUNTER — Encounter: Payer: Self-pay | Admitting: Internal Medicine

## 2014-12-04 ENCOUNTER — Ambulatory Visit (INDEPENDENT_AMBULATORY_CARE_PROVIDER_SITE_OTHER): Payer: Medicare Other | Admitting: Internal Medicine

## 2014-12-04 VITALS — BP 130/80 | HR 73 | Temp 98.9°F | Resp 20

## 2014-12-04 DIAGNOSIS — F0391 Unspecified dementia with behavioral disturbance: Secondary | ICD-10-CM | POA: Diagnosis not present

## 2014-12-04 DIAGNOSIS — M353 Polymyalgia rheumatica: Secondary | ICD-10-CM | POA: Diagnosis not present

## 2014-12-04 DIAGNOSIS — I10 Essential (primary) hypertension: Secondary | ICD-10-CM

## 2014-12-04 DIAGNOSIS — Z950 Presence of cardiac pacemaker: Secondary | ICD-10-CM

## 2014-12-04 DIAGNOSIS — R413 Other amnesia: Secondary | ICD-10-CM | POA: Diagnosis not present

## 2014-12-04 DIAGNOSIS — I2510 Atherosclerotic heart disease of native coronary artery without angina pectoris: Secondary | ICD-10-CM

## 2014-12-04 DIAGNOSIS — Z23 Encounter for immunization: Secondary | ICD-10-CM

## 2014-12-04 LAB — CBC WITH DIFFERENTIAL/PLATELET
Basophils Absolute: 0 10*3/uL (ref 0.0–0.1)
Basophils Relative: 0.4 % (ref 0.0–3.0)
EOS ABS: 0.1 10*3/uL (ref 0.0–0.7)
Eosinophils Relative: 1.6 % (ref 0.0–5.0)
HCT: 38.5 % — ABNORMAL LOW (ref 39.0–52.0)
HEMOGLOBIN: 12.9 g/dL — AB (ref 13.0–17.0)
Lymphocytes Relative: 22.6 % (ref 12.0–46.0)
Lymphs Abs: 1.3 10*3/uL (ref 0.7–4.0)
MCHC: 33.4 g/dL (ref 30.0–36.0)
MCV: 91.5 fl (ref 78.0–100.0)
MONO ABS: 0.6 10*3/uL (ref 0.1–1.0)
Monocytes Relative: 9.6 % (ref 3.0–12.0)
Neutro Abs: 3.9 10*3/uL (ref 1.4–7.7)
Neutrophils Relative %: 65.8 % (ref 43.0–77.0)
Platelets: 193 10*3/uL (ref 150.0–400.0)
RBC: 4.21 Mil/uL — ABNORMAL LOW (ref 4.22–5.81)
RDW: 14.7 % (ref 11.5–15.5)
WBC: 6 10*3/uL (ref 4.0–10.5)

## 2014-12-04 LAB — SEDIMENTATION RATE: Sed Rate: 8 mm/hr (ref 0–22)

## 2014-12-04 LAB — TSH: TSH: 2.23 u[IU]/mL (ref 0.35–4.50)

## 2014-12-04 LAB — COMPREHENSIVE METABOLIC PANEL
ALBUMIN: 3.8 g/dL (ref 3.5–5.2)
ALT: 13 U/L (ref 0–53)
AST: 18 U/L (ref 0–37)
Alkaline Phosphatase: 39 U/L (ref 39–117)
BILIRUBIN TOTAL: 0.6 mg/dL (ref 0.2–1.2)
BUN: 11 mg/dL (ref 6–23)
CALCIUM: 8.9 mg/dL (ref 8.4–10.5)
CO2: 31 mEq/L (ref 19–32)
Chloride: 103 mEq/L (ref 96–112)
Creatinine, Ser: 1.08 mg/dL (ref 0.40–1.50)
GFR: 68.63 mL/min (ref >60.00)
Glucose, Bld: 141 mg/dL — ABNORMAL HIGH (ref 70–99)
Potassium: 4 mEq/L (ref 3.5–5.1)
Sodium: 137 mEq/L (ref 135–145)
TOTAL PROTEIN: 6.8 g/dL (ref 6.0–8.3)

## 2014-12-04 MED ORDER — QUETIAPINE FUMARATE 25 MG PO TABS: 25.0000 mg | ORAL_TABLET | Freq: Two times a day (BID) | ORAL | Status: DC

## 2014-12-04 NOTE — Progress Notes (Signed)
Subjective:    Patient ID: [name] [name], male    DOB: 09-28-1925, 81 y.o.   MRN: [id number]  HPI 81 year old patient who has a history dementia.  He resides at an extended care facility.  He is accompanied by his wife today who is concerned about increasing aggressive behavior.  Medical regimen does include Seroquel that he takes in the morning only.  He also is on Klonopin to assist with sleep.  Past Medical History  Diagnosis Date  . Pacemaker 08/09/2006    family denies this history  . Complete heart block 11/21/2008    intermittent  . BENIGN PROSTATIC HYPERTROPHY 08/09/2006  . CEREBROVASCULAR ACCIDENT, HX OF 08/09/2006  . COLONIC POLYPS, HX OF 08/09/2006  . COPD 01/13/2009  . CORONARY ARTERY DISEASE 01/12/2008    Drug eluting stent Circumflex 2009;  LHC 02/08/11:  CFX stent patent, LVF normal, left ventricle "spade-shaped" consistent with hypertensive heart disease  . GERD 08/09/2006  . HYPERLIPIDEMIA 08/09/2006  . HYPERTENSION, BENIGN 08/13/2008  . MELENA, HX OF 02/01/2008  . PHLEBITIS, LOWER EXTREMITY 06/08/2007  . Polymyalgia rheumatica 08/09/2006  . SYNCOPE 12/25/2008    Secondary heart block  . VENTRICULAR TACHYCARDIA 01/12/2008    Nonsustained  . WEAKNESS 11/21/2008  . Complication of anesthesia 02/08/11    "? 1966; I think he just quit breathing after Demerol"  . Pacemaker     DOI 2012  . Pneumonia 02/08/11    "walking pneumonia; several years ago"  . Whooping cough     "as a child"  . Cancer   . Arthritis   . DEMENTIA   . Atrioventricular block, complete     Social History   Social History  . Marital Status: Married    Spouse Name: N/A  . Number of Children: N/A  . Years of Education: N/A   Occupational History  . retired    Social History Main Topics  . Smoking status: Former Smoker -- 1.00 packs/day for 71 years    Types: Cigarettes, Cigars    Quit date: 03/16/1991  . Smokeless tobacco: Never Used     Comment: Pt is smoking 2 cigars a day.  . Alcohol Use:  Yes     Comment: "used to have glass of wine or beer occasionally; hasn't had one since ~ 2002"  . Drug Use: No  . Sexual Activity: No   Other Topics Concern  . Not on file   Social History Narrative    Past Surgical History  Procedure Laterality Date  . Lumbar laminectomy    . Transurethral resection of prostate    . Coronary angioplasty with stent placement    . Cataract extraction w/ intraocular lens  implant, bilateral  ~ 2000  . Back surgery    . Insert / replace / remove pacemaker  09/07/10    initial placement  . Carotid endarterectomy      left  . Left heart catheterization with coronary angiogram N/A 02/08/2011    Procedure: LEFT HEART CATHETERIZATION WITH CORONARY ANGIOGRAM;  Surgeon: Thayer Headings, MD;  Location: [facility] CATH LAB;  Service: Cardiovascular;  Laterality: N/A;    Family History  Problem Relation Age of Onset  . Heart disease Father     Allergies  Allergen Reactions  . Atorvastatin Other (See Comments)    Unknown.  . Demerol [Meperidine]   . Meperidine Hcl Other (See Comments)    Unknown Patient wife states it "knocked him out" years ago    Current Outpatient Prescriptions  on File Prior to Visit  Medication Sig Dispense Refill  . aspirin EC 81 MG tablet Take 81 mg by mouth daily.      . clopidogrel (PLAVIX) 75 MG tablet Take 1 tablet (75 mg total) by mouth daily. 90 tablet 2  . divalproex (DEPAKOTE) 250 MG DR tablet TAKE ONE TABLET BY MOUTH EVERY DAY 90 tablet 2  . donepezil (ARICEPT) 10 MG tablet Take 1 tablet (10 mg total) by mouth daily. 90 tablet 2  . metoprolol tartrate (LOPRESSOR) 25 MG tablet 1 tablet every morning 90 tablet 6  . pravastatin (PRAVACHOL) 40 MG tablet Take 1 tablet (40 mg total) by mouth daily. 90 tablet 2  . QUEtiapine (SEROQUEL) 25 MG tablet 1 tablet every morning, only 180 tablet 2  . ranitidine (ZANTAC) 150 MG tablet 1 tablet daily    . traZODone (DESYREL) 50 MG tablet Take 0.5 tablets (25 mg total) by mouth at bedtime.  90 tablet 1   No current facility-administered medications on file prior to visit.    BP 130/80 mmHg  Pulse 73  Temp(Src) 98.9 F (37.2 C) (Oral)  Resp 20  Wt   SpO2 96%      Review of Systems  Constitutional: Negative for fever, chills, appetite change and fatigue.  HENT: Negative for congestion, dental problem, ear pain, hearing loss, sore throat, tinnitus, trouble swallowing and voice change.   Eyes: Negative for pain, discharge and visual disturbance.  Respiratory: Negative for cough, chest tightness, wheezing and stridor.   Cardiovascular: Negative for chest pain, palpitations and leg swelling.  Gastrointestinal: Negative for nausea, vomiting, abdominal pain, diarrhea, constipation, blood in stool and abdominal distention.  Genitourinary: Negative for urgency, hematuria, flank pain, discharge, difficulty urinating and genital sores.  Musculoskeletal: Negative for myalgias, back pain, joint swelling, arthralgias, gait problem and neck stiffness.  Skin: Negative for rash.  Neurological: Negative for dizziness, syncope, speech difficulty, weakness, numbness and headaches.  Hematological: Negative for adenopathy. Does not bruise/bleed easily.  Psychiatric/Behavioral: Positive for behavioral problems, confusion, decreased concentration and agitation. Negative for dysphoric mood. The patient is not nervous/anxious.        Objective:   Physical Exam  Constitutional: He is oriented to person, place, and time. He appears well-developed. No distress.  Blood pressure 130/80  HENT:  Head: Normocephalic.  Right Ear: External ear normal.  Left Ear: External ear normal.  Eyes: Conjunctivae and EOM are normal.  Neck: Normal range of motion.  Cardiovascular: Normal rate and normal heart sounds.   Pulmonary/Chest: Breath sounds normal.  Abdominal: Bowel sounds are normal.  Musculoskeletal: Normal range of motion. He exhibits no edema or tenderness.  Neurological: He is alert and  oriented to person, place, and time.  Psychiatric: He has a normal mood and affect. His behavior is normal.          Assessment & Plan:   Senile dementia with increase in aggressive behavior.  Will increase Seroquel to a twice a day regimen.  Will discontinue Depakote ; unclear why the patient is on this medication.  This may be for agitation, but I believe he may have a prior history of trigeminal neuralgia Essential hypertension, stable Coronary artery disease, stable Status post permanent pacemaker  Flu vaccine administered We'll check laboratory update

## 2014-12-04 NOTE — Patient Instructions (Signed)
Limit your sodium (Salt) intake  Return in 6 months for follow-up  

## 2014-12-04 NOTE — Progress Notes (Signed)
Pre visit review using our clinic review tool, if applicable. No additional management support is needed unless otherwise documented below in the visit note. 

## 2014-12-20 ENCOUNTER — Telehealth: Payer: Self-pay | Admitting: Internal Medicine

## 2014-12-20 NOTE — Telephone Encounter (Signed)
Discontinue

## 2014-12-20 NOTE — Telephone Encounter (Signed)
Wife calling to see how long she is supposed soak [name] [name] feet in the antibacterial soap. Says she has been doing it for 13 days.

## 2014-12-20 NOTE — Telephone Encounter (Signed)
Mrs Lozito is aware

## 2014-12-23 NOTE — Telephone Encounter (Signed)
Please call or fax order

## 2014-12-23 NOTE — Telephone Encounter (Signed)
Order faxed to [facility] to discontinue antibacterial soaks to feet.

## 2014-12-23 NOTE — Telephone Encounter (Signed)
Pt's wife [name] [name] calling to report that [name] [name] is requesting an order from Dr. Raliegh Ip advising to d/c antibacterial foot soak.  Fax # [phone]

## 2014-12-31 ENCOUNTER — Telehealth: Payer: Self-pay | Admitting: Cardiology

## 2014-12-31 ENCOUNTER — Telehealth: Payer: Self-pay | Admitting: Internal Medicine

## 2014-12-31 ENCOUNTER — Ambulatory Visit (INDEPENDENT_AMBULATORY_CARE_PROVIDER_SITE_OTHER): Payer: Medicare Other | Admitting: *Deleted

## 2014-12-31 DIAGNOSIS — I495 Sick sinus syndrome: Secondary | ICD-10-CM | POA: Diagnosis not present

## 2014-12-31 NOTE — Telephone Encounter (Signed)
Informed pt wife that pt remote transmission transmission

## 2014-12-31 NOTE — Telephone Encounter (Signed)
Confirmed remote transmission w/ pt wife.   

## 2014-12-31 NOTE — Telephone Encounter (Signed)
New MEssage  Pt wife calling to see if transmission was sent successfully. Please call back and discuss.

## 2015-01-02 ENCOUNTER — Encounter: Payer: Self-pay | Admitting: Cardiology

## 2015-01-02 LAB — CUP PACEART REMOTE DEVICE CHECK
Battery Voltage: 2.96 V
Brady Statistic AS VP Percent: 1 %
Brady Statistic RA Percent Paced: 35 %
Implantable Lead Implant Date: 20120625
Implantable Lead Location: 753859
Lead Channel Impedance Value: 390 Ohm
Lead Channel Pacing Threshold Amplitude: 0.75 V
Lead Channel Pacing Threshold Pulse Width: 0.4 ms
Lead Channel Sensing Intrinsic Amplitude: 4 mV
Lead Channel Setting Pacing Pulse Width: 0.4 ms
Lead Channel Setting Sensing Sensitivity: 2 mV
MDC IDC LEAD IMPLANT DT: 20120625
MDC IDC LEAD LOCATION: 753860
MDC IDC MSMT BATTERY REMAINING LONGEVITY: 125 mo
MDC IDC MSMT BATTERY REMAINING PERCENTAGE: 95.5 %
MDC IDC MSMT LEADCHNL RV IMPEDANCE VALUE: 560 Ohm
MDC IDC MSMT LEADCHNL RV SENSING INTR AMPL: 12 mV
MDC IDC PG SERIAL: 7241194
MDC IDC SESS DTM: 20161018175654
MDC IDC SET LEADCHNL RA PACING AMPLITUDE: 1.75 V
MDC IDC SET LEADCHNL RV PACING AMPLITUDE: 2.5 V
MDC IDC STAT BRADY AP VP PERCENT: 1.5 %
MDC IDC STAT BRADY AP VS PERCENT: 35 %
MDC IDC STAT BRADY AS VS PERCENT: 63 %
MDC IDC STAT BRADY RV PERCENT PACED: 1.7 %

## 2015-01-02 NOTE — Progress Notes (Signed)
Remote pacemaker transmission.   

## 2015-02-03 ENCOUNTER — Ambulatory Visit (INDEPENDENT_AMBULATORY_CARE_PROVIDER_SITE_OTHER): Payer: Medicare Other | Admitting: Internal Medicine

## 2015-02-03 ENCOUNTER — Encounter: Payer: Self-pay | Admitting: Internal Medicine

## 2015-02-03 VITALS — BP 130/98 | HR 65 | Ht 67.5 in | Wt 182.8 lb

## 2015-02-03 DIAGNOSIS — I442 Atrioventricular block, complete: Secondary | ICD-10-CM | POA: Diagnosis not present

## 2015-02-03 LAB — CUP PACEART INCLINIC DEVICE CHECK
Battery Remaining Longevity: 112.8
Battery Voltage: 2.93 V
Date Time Interrogation Session: 20161121130607
Implantable Lead Location: 753859
Lead Channel Pacing Threshold Amplitude: 0.75 V
Lead Channel Pacing Threshold Pulse Width: 0.4 ms
Lead Channel Sensing Intrinsic Amplitude: 3.8 mV
Lead Channel Setting Pacing Amplitude: 2.5 V
MDC IDC LEAD IMPLANT DT: 20120625
MDC IDC LEAD IMPLANT DT: 20120625
MDC IDC LEAD LOCATION: 753860
MDC IDC MSMT LEADCHNL RA IMPEDANCE VALUE: 412.5 Ohm
MDC IDC MSMT LEADCHNL RA PACING THRESHOLD PULSEWIDTH: 0.4 ms
MDC IDC MSMT LEADCHNL RV IMPEDANCE VALUE: 587.5 Ohm
MDC IDC MSMT LEADCHNL RV PACING THRESHOLD AMPLITUDE: 0.75 V
MDC IDC MSMT LEADCHNL RV PACING THRESHOLD AMPLITUDE: 0.75 V
MDC IDC MSMT LEADCHNL RV PACING THRESHOLD PULSEWIDTH: 0.4 ms
MDC IDC MSMT LEADCHNL RV SENSING INTR AMPL: 12 mV
MDC IDC PG SERIAL: 7241194
MDC IDC SET LEADCHNL RA PACING AMPLITUDE: 1.75 V
MDC IDC SET LEADCHNL RV PACING PULSEWIDTH: 0.4 ms
MDC IDC SET LEADCHNL RV SENSING SENSITIVITY: 2 mV
MDC IDC STAT BRADY RA PERCENT PACED: 34 %
MDC IDC STAT BRADY RV PERCENT PACED: 1.6 %
Pulse Gen Model: 2210

## 2015-02-03 NOTE — Patient Instructions (Signed)
Medication Instructions: - no changes  Labwork: - none  Procedures/Testing: - none  Follow-Up: - Remote monitoring is used to monitor your Pacemaker of ICD from home. This monitoring reduces the number of office visits required to check your device to one time per year. It allows [place] to keep an eye on the functioning of your device to ensure it is working properly. You are scheduled for a device check from home on 05/05/15. You may send your transmission at any time that day. If you have a wireless device, the transmission will be sent automatically. After your physician reviews your transmission, you will receive a postcard with your next transmission date.  - Your physician wants you to follow-up in: 1 year with Dr. Caryl Comes. You will receive a reminder letter in the mail two months in advance. If you don't receive a letter, please call our office to schedule the follow-up appointment.  Any Additional Special Instructions Will Be Listed Below (If Applicable).

## 2015-02-03 NOTE — Progress Notes (Signed)
Patient Care Team: Marletta Lor, MD as PCP - General Burnell Blanks, MD as Consulting Physician (Cardiology)   HPI  [name] [name] is a 81 y.o. male Seen in followup for intermittent complete heart block for which  he underwent pacing spring 2012.  He has a history of coronary artery disease with prior circumflex stenting. Echo March 2013 demonstrated normal left ventricular function without significant valvular issues.  Myoview October 2011 demonstrated no ischemia     The patient denies chest pain, shortness of breath, nocturnal dyspnea, orthopnea or peripheral edema.   His memory and irritability remain an issue   He has   moved into  [place]  Past Medical History  Diagnosis Date  . Pacemaker 08/09/2006    family denies this history  . Complete heart block ([place]) 11/21/2008    intermittent  . BENIGN PROSTATIC HYPERTROPHY 08/09/2006  . CEREBROVASCULAR ACCIDENT, HX OF 08/09/2006  . COLONIC POLYPS, HX OF 08/09/2006  . COPD 01/13/2009  . CORONARY ARTERY DISEASE 01/12/2008    Drug eluting stent Circumflex 2009;  LHC 02/08/11:  CFX stent patent, LVF normal, left ventricle "spade-shaped" consistent with hypertensive heart disease  . GERD 08/09/2006  . HYPERLIPIDEMIA 08/09/2006  . HYPERTENSION, BENIGN 08/13/2008  . MELENA, HX OF 02/01/2008  . PHLEBITIS, LOWER EXTREMITY 06/08/2007  . Polymyalgia rheumatica ([place]) 08/09/2006  . SYNCOPE 12/25/2008    Secondary heart block  . VENTRICULAR TACHYCARDIA 01/12/2008    Nonsustained  . WEAKNESS 11/21/2008  . Complication of anesthesia 02/08/11    "? 1966; I think he just quit breathing after Demerol"  . Pacemaker     DOI 2012  . Pneumonia 02/08/11    "walking pneumonia; several years ago"  . Whooping cough     "as a child"  . Cancer ([place])   . Arthritis   . DEMENTIA   . Atrioventricular block, complete [facility])     Past Surgical History  Procedure Laterality Date  . Lumbar laminectomy    . Transurethral resection of  prostate    . Coronary angioplasty with stent placement    . Cataract extraction w/ intraocular lens  implant, bilateral  ~ 2000  . Back surgery    . Insert / replace / remove pacemaker  09/07/10    initial placement  . Carotid endarterectomy      left  . Left heart catheterization with coronary angiogram N/A 02/08/2011    Procedure: LEFT HEART CATHETERIZATION WITH CORONARY ANGIOGRAM;  Surgeon: Thayer Headings, MD;  [facility] C[facility] LAB;  Service: Cardiovascular;  Laterality: N/A;    Current Outpatient Prescriptions  Medication Sig Dispense Refill  . aspirin EC 81 MG tablet Take 81 mg by mouth daily.      . clonazePAM (KLONOPIN) 0.25 MG disintegrating tablet Take 0.25 mg by mouth. 1 TABLET BY MOUTH EVERY MORNING AND 1 TABLET AT BEDTIME AS NEEDED FOR INSOMNIA.  4  . clopidogrel (PLAVIX) 75 MG tablet Take 1 tablet (75 mg total) by mouth daily. 90 tablet 2  . donepezil (ARICEPT) 10 MG tablet Take 1 tablet (10 mg total) by mouth daily. 90 tablet 2  . metoprolol tartrate (LOPRESSOR) 25 MG tablet 1 tablet every morning 90 tablet 6  . pravastatin (PRAVACHOL) 40 MG tablet Take 1 tablet (40 mg total) by mouth daily. 90 tablet 2  . QUEtiapine (SEROQUEL) 25 MG tablet Take 1 tablet (25 mg total) by mouth 2 (two) times daily. 1 tablet every morning, only 180 tablet  2  . ranitidine (ZANTAC) 150 MG tablet 1 tablet daily    . traZODone (DESYREL) 50 MG tablet Take 0.5 tablets (25 mg total) by mouth at bedtime. 90 tablet 1   No current facility-administered medications for this visit.    Allergies  Allergen Reactions  . Atorvastatin Other (See Comments)    Unknown.  . Demerol [Meperidine]   . Meperidine Hcl Other (See Comments)    Unknown Patient wife states it "knocked him out" years ago    Review of Systems negative except from HPI and PMH  Physical Exam BP 130/98 mmHg  Pulse 65  Ht 5' 7.5" (1.715 m)  Wt 182 lb 12.8 oz (82.918 kg)  BMI 28.19 kg/m2  Well developed and nourished in no  acute distress HENT normal Neck supple with JVP-flat Clear Regular rate and rhythm, no murmurs or gallops Abd-soft with active BS No Clubbing cyanosis edema Skin-warm and dry A & Oriented  Grossly normal sensory and motor function  ECG A apcing with narrow QRs  Assessment and  Plan  Sinus node dysfunction  Pacemaker-[employer]  The patient's device was interrogated.  The information was reviewed. No changes were made in the programming.    Atrial fibrillation-paroxysmal  Coronary artery disease with prior stenting  Dementia  Without symptoms of ischemia   He continues with scant atrial fibrillation. We'll continue him on his dual antiplatelet therapy.

## 2015-03-07 ENCOUNTER — Other Ambulatory Visit: Payer: Self-pay | Admitting: *Deleted

## 2015-03-07 MED ORDER — CLONAZEPAM 0.25 MG PO TBDP
ORAL_TABLET | ORAL | Status: DC
Start: 2015-03-07 — End: 2015-09-11

## 2015-03-07 NOTE — Telephone Encounter (Signed)
Received refill request for Clonazepam, Rx faxed to [facility].

## 2015-05-05 ENCOUNTER — Telehealth: Payer: Self-pay | Admitting: Cardiology

## 2015-05-05 ENCOUNTER — Ambulatory Visit (INDEPENDENT_AMBULATORY_CARE_PROVIDER_SITE_OTHER): Payer: Medicare Other | Admitting: *Deleted

## 2015-05-05 DIAGNOSIS — I495 Sick sinus syndrome: Secondary | ICD-10-CM | POA: Diagnosis not present

## 2015-05-05 DIAGNOSIS — I442 Atrioventricular block, complete: Secondary | ICD-10-CM | POA: Diagnosis not present

## 2015-05-05 NOTE — Telephone Encounter (Signed)
LMOVM reminding pt to send remote transmission.   

## 2015-05-07 NOTE — Progress Notes (Signed)
Remote pacemaker transmission.   

## 2015-05-29 LAB — CUP PACEART REMOTE DEVICE CHECK
Brady Statistic AP VP Percent: 2.5 %
Brady Statistic AP VS Percent: 28 %
Brady Statistic AS VP Percent: 1 %
Brady Statistic RA Percent Paced: 29 %
Date Time Interrogation Session: 20170221193524
Implantable Lead Implant Date: 20120625
Implantable Lead Location: 753859
Lead Channel Impedance Value: 610 Ohm
Lead Channel Pacing Threshold Pulse Width: 0.4 ms
Lead Channel Setting Pacing Pulse Width: 0.4 ms
Lead Channel Setting Sensing Sensitivity: 2 mV
MDC IDC LEAD IMPLANT DT: 20120625
MDC IDC LEAD LOCATION: 753860
MDC IDC MSMT BATTERY REMAINING LONGEVITY: 103 mo
MDC IDC MSMT BATTERY REMAINING PERCENTAGE: 81 %
MDC IDC MSMT BATTERY VOLTAGE: 2.93 V
MDC IDC MSMT LEADCHNL RA IMPEDANCE VALUE: 410 Ohm
MDC IDC MSMT LEADCHNL RA PACING THRESHOLD AMPLITUDE: 0.75 V
MDC IDC MSMT LEADCHNL RA SENSING INTR AMPL: 5 mV
MDC IDC MSMT LEADCHNL RV SENSING INTR AMPL: 12 mV
MDC IDC SET LEADCHNL RA PACING AMPLITUDE: 1.75 V
MDC IDC SET LEADCHNL RV PACING AMPLITUDE: 2.5 V
MDC IDC STAT BRADY AS VS PERCENT: 68 %
MDC IDC STAT BRADY RV PERCENT PACED: 4.7 %
Pulse Gen Model: 2210
Pulse Gen Serial Number: 7241194

## 2015-05-30 ENCOUNTER — Encounter: Payer: Self-pay | Admitting: Cardiology

## 2015-07-23 ENCOUNTER — Ambulatory Visit (INDEPENDENT_AMBULATORY_CARE_PROVIDER_SITE_OTHER): Payer: Medicare Other | Admitting: Internal Medicine

## 2015-07-23 ENCOUNTER — Encounter: Payer: Self-pay | Admitting: Internal Medicine

## 2015-07-23 VITALS — BP 130/80 | HR 64 | Temp 98.3°F | Resp 20 | Ht 67.5 in | Wt 184.0 lb

## 2015-07-23 DIAGNOSIS — M353 Polymyalgia rheumatica: Secondary | ICD-10-CM

## 2015-07-23 DIAGNOSIS — E785 Hyperlipidemia, unspecified: Secondary | ICD-10-CM | POA: Diagnosis not present

## 2015-07-23 DIAGNOSIS — I442 Atrioventricular block, complete: Secondary | ICD-10-CM

## 2015-07-23 DIAGNOSIS — R413 Other amnesia: Secondary | ICD-10-CM

## 2015-07-23 DIAGNOSIS — I10 Essential (primary) hypertension: Secondary | ICD-10-CM | POA: Diagnosis not present

## 2015-07-23 DIAGNOSIS — D649 Anemia, unspecified: Secondary | ICD-10-CM

## 2015-07-23 DIAGNOSIS — Z0000 Encounter for general adult medical examination without abnormal findings: Secondary | ICD-10-CM

## 2015-07-23 DIAGNOSIS — I2510 Atherosclerotic heart disease of native coronary artery without angina pectoris: Secondary | ICD-10-CM

## 2015-07-23 DIAGNOSIS — J449 Chronic obstructive pulmonary disease, unspecified: Secondary | ICD-10-CM
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
## 2015-07-23 NOTE — Progress Notes (Signed)
Pre visit review using our clinic review tool, if applicable. No additional management support is needed unless otherwise documented below in the visit note. 

## 2015-07-23 NOTE — Patient Instructions (Signed)
No change in medical regimen  Limit your sodium (Salt) intake

## 2015-08-04 ENCOUNTER — Ambulatory Visit (INDEPENDENT_AMBULATORY_CARE_PROVIDER_SITE_OTHER): Payer: Medicare Other | Admitting: *Deleted

## 2015-08-04 DIAGNOSIS — I495 Sick sinus syndrome: Secondary | ICD-10-CM

## 2015-08-04 NOTE — Progress Notes (Signed)
Remote pacemaker transmission.   

## 2015-08-25 LAB — CUP PACEART REMOTE DEVICE CHECK
Brady Statistic RA Percent Paced: 28 %
Brady Statistic RV Percent Paced: 7 %
Implantable Lead Implant Date: 20120625
Implantable Lead Location: 753859
Lead Channel Impedance Value: 560 Ohm
Lead Channel Pacing Threshold Amplitude: 0.625 V
Lead Channel Pacing Threshold Pulse Width: 0.4 ms
Lead Channel Setting Pacing Amplitude: 1.75 V
MDC IDC LEAD IMPLANT DT: 20120625
MDC IDC LEAD LOCATION: 753860
MDC IDC MSMT LEADCHNL RA IMPEDANCE VALUE: 390 Ohm
MDC IDC MSMT LEADCHNL RA SENSING INTR AMPL: 3.1 mV
MDC IDC MSMT LEADCHNL RV SENSING INTR AMPL: 12 mV
MDC IDC SESS DTM: 20170612111905
MDC IDC SET LEADCHNL RV PACING AMPLITUDE: 2.5 V
MDC IDC SET LEADCHNL RV PACING PULSEWIDTH: 0.4 ms
MDC IDC SET LEADCHNL RV SENSING SENSITIVITY: 2 mV
Pulse Gen Serial Number: 7241194

## 2015-08-28 ENCOUNTER — Encounter: Payer: Self-pay | Admitting: Cardiology

## 2015-09-11 ENCOUNTER — Other Ambulatory Visit: Payer: Self-pay | Admitting: Family Medicine

## 2015-09-11 MED ORDER — CLONAZEPAM 0.25 MG PO TBDP: ORAL_TABLET | ORAL | Status: DC

## 2015-09-11 NOTE — Telephone Encounter (Signed)
Rx printed and faxed to [facility].

## 2015-11-03 ENCOUNTER — Ambulatory Visit (INDEPENDENT_AMBULATORY_CARE_PROVIDER_SITE_OTHER): Payer: Medicare Other | Admitting: *Deleted

## 2015-11-03 ENCOUNTER — Telehealth: Payer: Self-pay | Admitting: Cardiology

## 2015-11-03 DIAGNOSIS — I495 Sick sinus syndrome: Secondary | ICD-10-CM | POA: Diagnosis not present

## 2015-11-03 NOTE — Progress Notes (Signed)
Remote pacemaker transmission.   

## 2015-11-03 NOTE — Telephone Encounter (Signed)
Confirmed remote transmission w/ pt wife.   

## 2015-11-05 ENCOUNTER — Encounter: Payer: Self-pay | Admitting: Cardiology

## 2015-11-11 ENCOUNTER — Other Ambulatory Visit: Payer: Self-pay | Admitting: Internal Medicine

## 2015-11-11 NOTE — Telephone Encounter (Signed)
Error

## 2015-11-13 ENCOUNTER — Other Ambulatory Visit: Payer: Self-pay | Admitting: *Deleted

## 2015-11-13 LAB — CUP PACEART REMOTE DEVICE CHECK
Brady Statistic AS VP Percent: 1 %
Brady Statistic AS VS Percent: 71 %
Brady Statistic RA Percent Paced: 26 %
Date Time Interrogation Session: 20170821180713
Implantable Lead Location: 753860
Lead Channel Pacing Threshold Amplitude: 0.75 V
Lead Channel Pacing Threshold Pulse Width: 0.4 ms
Lead Channel Sensing Intrinsic Amplitude: 12 mV
Lead Channel Setting Pacing Amplitude: 1.625
Lead Channel Setting Pacing Amplitude: 2.5 V
MDC IDC LEAD IMPLANT DT: 20120625
MDC IDC LEAD IMPLANT DT: 20120625
MDC IDC LEAD LOCATION: 753859
MDC IDC MSMT BATTERY REMAINING LONGEVITY: 103 mo
MDC IDC MSMT BATTERY REMAINING PERCENTAGE: 81 %
MDC IDC MSMT BATTERY VOLTAGE: 2.93 V
MDC IDC MSMT LEADCHNL RA IMPEDANCE VALUE: 380 Ohm
MDC IDC MSMT LEADCHNL RA PACING THRESHOLD AMPLITUDE: 0.625 V
MDC IDC MSMT LEADCHNL RA PACING THRESHOLD PULSEWIDTH: 0.4 ms
MDC IDC MSMT LEADCHNL RA SENSING INTR AMPL: 2.1 mV
MDC IDC MSMT LEADCHNL RV IMPEDANCE VALUE: 600 Ohm
MDC IDC SET LEADCHNL RV PACING PULSEWIDTH: 0.4 ms
MDC IDC SET LEADCHNL RV SENSING SENSITIVITY: 2 mV
MDC IDC STAT BRADY AP VP PERCENT: 2.1 %
MDC IDC STAT BRADY AP VS PERCENT: 27 %
MDC IDC STAT BRADY RV PERCENT PACED: 5.3 %
Pulse Gen Model: 2210
Pulse Gen Serial Number: 7241194

## 2015-11-13 MED ORDER — CLONAZEPAM 0.25 MG PO TBDP: ORAL_TABLET | ORAL | 5 refills | Status: DC

## 2015-11-13 NOTE — Telephone Encounter (Signed)
Received refill request from [facility] for pt Clonazepam due to change in pharmacy. Rx printed and signed by Dr. Sarajane Jews and faxed to new pharmacy [facility].

## 2015-12-17 ENCOUNTER — Other Ambulatory Visit: Payer: Self-pay | Admitting: Internal Medicine

## 2016-01-29 NOTE — Progress Notes (Signed)
Cardiology Office Note Date:  01/30/2016  Patient ID:  [name], [name] 1925/10/21, MRN [id number] PCP:  Nyoka Cowden, MD  Cardiologist:  Dr. Julianne Handler Electrophysiologist Dr. Caryl Comes   Chief Complaint:  annual visit  History of Present Illness: [name] [name] is a 82 y.o. male with history of CHB w/PPM, CVA ? 2008, COPD, CAD (PCI 2009 to Cx, patent stent by cath 2012 as noted in hx), HTN, HLD, PVD w/  Left CEA, PAFib, Dementia living in an ALF, comes to the office today to be seen for Dr. Caryl Comes.  He was last seen by him Nov 2016, at that time doing well, his AFib described as "scant" with decision to continue DAPT.  He comes today accompanied by his wife, he continues to live at the ALF, she reports intermittent issues with agitation, otherwise reports he has been well, or at least has not received any notifications of any health issues.  The patient tells me he feels well, denies any kind of CP, palpitations or SOB, no dizziness, near syncope or syncope.  He is observed to ambulate with a walker.  Device information: SJM dual chamber PPM, implanted 09/07/10, Dr. Caryl Comes, [place]  Past Medical History:  Diagnosis Date  . Arthritis   . Atrioventricular block, complete ([place])   . BENIGN PROSTATIC HYPERTROPHY 08/09/2006  . Cancer ([place])   . CEREBROVASCULAR ACCIDENT, HX OF 08/09/2006  . COLONIC POLYPS, HX OF 08/09/2006  . Complete heart block ([place]) 11/21/2008   intermittent  . Complication of anesthesia 02/08/11   "? 1966; I think he just quit breathing after Demerol"  . COPD 01/13/2009  . CORONARY ARTERY DISEASE 01/12/2008   Drug eluting stent Circumflex 2009;  LHC 02/08/11:  CFX stent patent, LVF normal, left ventricle "spade-shaped" consistent with hypertensive heart disease  . DEMENTIA   . GERD 08/09/2006  . HYPERLIPIDEMIA 08/09/2006  . HYPERTENSION, BENIGN 08/13/2008  . MELENA, HX OF 02/01/2008  . Pacemaker 08/09/2006   family denies this history  . Pacemaker    DOI 2012  .  PHLEBITIS, LOWER EXTREMITY 06/08/2007  . Pneumonia 02/08/11   "walking pneumonia; several years ago"  . Polymyalgia rheumatica ([place]) 08/09/2006  . SYNCOPE 12/25/2008   Secondary heart block  . VENTRICULAR TACHYCARDIA 01/12/2008   Nonsustained  . WEAKNESS 11/21/2008  . Whooping cough    "as a child"    Past Surgical History:  Procedure Laterality Date  . BACK SURGERY    . CAROTID ENDARTERECTOMY     left  . CATARACT EXTRACTION W/ INTRAOCULAR LENS  IMPLANT, BILATERAL  ~ 2000  . CORONARY ANGIOPLASTY WITH STENT PLACEMENT    . INSERT / REPLACE / REMOVE PACEMAKER  09/07/10   initial placement  . LEFT HEART CATHETERIZATION WITH CORONARY ANGIOGRAM N/A 02/08/2011   Procedure: LEFT HEART CATHETERIZATION WITH CORONARY ANGIOGRAM;  Surgeon: Thayer Headings, MD;  [facility] C[facility] LAB;  Service: Cardiovascular;  Laterality: N/A;  . LUMBAR LAMINECTOMY    . TRANSURETHRAL RESECTION OF PROSTATE      Current Outpatient Prescriptions  Medication Sig Dispense Refill  . aspirin EC 81 MG tablet Take 81 mg by mouth daily.      . clonazePAM (KLONOPIN) 0.25 MG disintegrating tablet 1 TABLET BY MOUTH EVERY MORNING AND 1 TABLET AT BEDTIME AS NEEDED FOR INSOMNIA. 60 tablet 5  . clopidogrel (PLAVIX) 75 MG tablet Take 1 tablet (75 mg total) by mouth daily. 90 tablet 2  . donepezil (ARICEPT) 10 MG tablet Take 1 tablet (10  mg total) by mouth daily. 90 tablet 2  . metoprolol tartrate (LOPRESSOR) 25 MG tablet 1 tablet every morning 90 tablet 6  . pravastatin (PRAVACHOL) 40 MG tablet Take 1 tablet (40 mg total) by mouth daily. 90 tablet 2  . QUEtiapine (SEROQUEL) 25 MG tablet Take 1 tablet (25 mg total) by mouth 2 (two) times daily. 1 tablet every morning, only 180 tablet 2  . ranitidine (ZANTAC) 150 MG tablet 1 tablet daily    . traZODone (DESYREL) 50 MG tablet TAKE 1/2 TABLET (25MG ) BY MOUTH AT BEDTIME  COURTESY FILL 12/16/15 15 tablet 10   No current facility-administered medications for this visit.      Allergies:   Atorvastatin; Demerol [meperidine]; and Meperidine hcl   Social History:  The patient  reports that he quit smoking about 24 years ago. His smoking use included Cigarettes and Cigars. He has a 71.00 pack-year smoking history. He has never used smokeless tobacco. He reports that he drinks alcohol. He reports that he does not use drugs.   Family History:  The patient's family history includes Heart disease in his father.  ROS:  Please see the history of present illness.    All other systems are reviewed and otherwise negative.   PHYSICAL EXAM:  VS:  BP 136/84   Pulse 69   Ht 5' 7.5" (1.715 m)   Wt 184 lb (83.5 kg)   BMI 28.39 kg/m  BMI: Body mass index is 28.39 kg/m. Well nourished, well developed, in no acute distress  HEENT: normocephalic, atraumatic  Neck: no JVD, carotid bruits or masses Cardiac: RRR no significant murmurs, no rubs, or gallops Lungs:  clear to auscultation bilaterally, no wheezing, rhonchi or rales  Abd: soft, nontender MS: no deformity, age appropriate atrophy Ext: no edema  Skin: warm and dry, no rash Neuro:  No gross deficits appreciated Psych: euthymic mood, full affect  PPM site is stable, no tethering or discomfort   EKG:  Done today and reviewed by myself shows SR, 1st degree AVblock, 69bpm, PR 233ms, QRS 57ms, QTc 428 PPM interrogation today reviewed by myself: normal device function.  HVR and AMS episodes noted.  EGMs look 1:1, longest episode is 9 hours with A rate 640 likely degenerated into AF.   05/28/11: TTE Study Conclusions Left ventricle: The cavity size was normal. Wall thickness was normal. Systolic function was normal. The estimated ejection fraction was in the range of 50% to 55%. Doppler parameters are consistent with abnormal left ventricular relaxation (grade 1 diastolic dysfunction).  Recent Labs: No results found for requested labs within last 8760 hours.  No results found for requested labs within last 8760  hours.   CrCl cannot be calculated (Patient's most recent lab result is older than the maximum 21 days allowed.).   Wt Readings from Last 3 Encounters:  01/30/16 184 lb (83.5 kg)  07/23/15 184 lb (83.5 kg)  02/03/15 182 lb 12.8 oz (82.9 kg)     Other studies reviewed: Additional studies/records reviewed today include: summarized above  ASSESSMENT AND PLAN:  1. CHB w/PPP     Normal device function, no changes made  2. PAFIb     3.6% burden     EGM available appears 1:1, duration is 9 hours, with max A rate 640, possibly degenerated into AF      Has bled score is 8.9%, discussed with patient and wife, embolic/strke risk as well. At this time, thier comfort level is to continue the current medicines without changes  and continue to monitor via his device going forward.  Given no symptoms reported/associated with ATch/tacy episodes, ? Episodes of agitation perhaps contribute, no changes are being made  3. CAD     no symptome     Continue the same meds The patient's wife tells me the PMD does labs annually, though this is difficult since the patient gets agitated and sometimes won't allow the blood draws  4. HTN     stable, no changes   5. HLD     Will defer to PMD management  Disposition: F/u with Q 68month remote pacer checks, 1 year with dr. Caryl Comes, sooner if needed.  Current medicines are reviewed at length with the patient today.  The patient did not have any concerns regarding medicines.  [name] Lasso, PA-C 01/30/2016 12:02 PM     [place]  [id number] [phone] (office)  [phone] (fax)

## 2016-01-30 ENCOUNTER — Encounter: Payer: Medicare Other | Admitting: Internal Medicine

## 2016-01-30 ENCOUNTER — Encounter (INDEPENDENT_AMBULATORY_CARE_PROVIDER_SITE_OTHER): Payer: Self-pay

## 2016-01-30 ENCOUNTER — Ambulatory Visit (INDEPENDENT_AMBULATORY_CARE_PROVIDER_SITE_OTHER): Payer: Medicare Other | Admitting: Physician Assistant

## 2016-01-30 VITALS — BP 136/84 | HR 69 | Ht 67.5 in | Wt 184.0 lb

## 2016-01-30 DIAGNOSIS — I2510 Atherosclerotic heart disease of native coronary artery without angina pectoris: Secondary | ICD-10-CM

## 2016-01-30 DIAGNOSIS — I442 Atrioventricular block, complete: Secondary | ICD-10-CM

## 2016-01-30 DIAGNOSIS — I480 Paroxysmal atrial fibrillation: Secondary | ICD-10-CM | POA: Diagnosis not present

## 2016-01-30 DIAGNOSIS — I10 Essential (primary) hypertension: Secondary | ICD-10-CM | POA: Diagnosis not present

## 2016-01-30 NOTE — Patient Instructions (Signed)
Medication Instructions:   Your physician recommends that you continue on your current medications as directed. Please refer to the Current Medication list given to you today.    If you need a refill on your cardiac medications before your next appointment, please call your pharmacy.  Labwork: NONE ORDERED  TODAY    Testing/Procedures: NONE ORDERED  TODAY    Follow-Up: Your physician wants you to follow-up in: [place].Marland Kitchen You will receive a reminder letter in the mail two months in advance. If you don't receive a letter, please call our office to schedule the follow-up appointment.  Remote monitoring is used to monitor your Pacemaker of ICD from home. This monitoring reduces the number of office visits required to check your device to one time per year. It allows [place] to keep an eye on the functioning of your device to ensure it is working properly. You are scheduled for a device check from home on 04/30/16... You may send your transmission at any time that day. If you have a wireless device, the transmission will be sent automatically. After your physician reviews your transmission, you will receive a postcard with your next transmission date.    Any Other Special Instructions Will Be Listed Below (If Applicable).

## 2016-02-02 ENCOUNTER — Telehealth: Payer: Self-pay | Admitting: Physician Assistant

## 2016-02-02 NOTE — Telephone Encounter (Signed)
Follow Up:    Pt was seen here on Friday and she forgot to tell them he did fall on 11-17-15.When she was here on Friday they said he had a fall,but she could not remember it.It was a terrible fall,he bent his walker so bad,he can no longer use his walker.Please call,she says she have more details and some concerns.

## 2016-04-19 ENCOUNTER — Telehealth: Payer: Self-pay | Admitting: Internal Medicine

## 2016-04-19 NOTE — Telephone Encounter (Signed)
Called and discussed

## 2016-04-19 NOTE — Telephone Encounter (Signed)
See message below, please advise.

## 2016-04-19 NOTE — Telephone Encounter (Signed)
[name] [name], nurse at [facility] would like dr K to call her and discuss pt and getting him some additional help She is in charge of pt's care.[name] [name] states she has discussed with his wife and she is ion board with this request.

## 2016-04-28 ENCOUNTER — Telehealth: Payer: Self-pay | Admitting: Internal Medicine

## 2016-04-28 NOTE — Telephone Encounter (Signed)
[name] [name] with [name] would like to have a call back [phone] with clarification on a order that was rec'd on 04/19/16.

## 2016-04-29 NOTE — Telephone Encounter (Signed)
[place] for [employer], left message to call office back.

## 2016-05-19 ENCOUNTER — Ambulatory Visit (INDEPENDENT_AMBULATORY_CARE_PROVIDER_SITE_OTHER): Payer: Medicare Other | Admitting: *Deleted

## 2016-05-19 DIAGNOSIS — I442 Atrioventricular block, complete: Secondary | ICD-10-CM | POA: Diagnosis not present

## 2016-05-19 NOTE — Progress Notes (Signed)
Remote pacemaker transmission.   

## 2016-05-20 ENCOUNTER — Encounter: Payer: Self-pay | Admitting: Cardiology

## 2016-05-21 LAB — CUP PACEART REMOTE DEVICE CHECK
Battery Voltage: 2.93 V
Brady Statistic AP VP Percent: 2.9 %
Brady Statistic RA Percent Paced: 25 %
Brady Statistic RV Percent Paced: 3.6 %
Date Time Interrogation Session: 20180307090220
Implantable Lead Implant Date: 20120625
Implantable Lead Implant Date: 20120625
Implantable Lead Location: 753859
Implantable Pulse Generator Implant Date: 20120625
Lead Channel Impedance Value: 550 Ohm
Lead Channel Pacing Threshold Amplitude: 0.625 V
Lead Channel Pacing Threshold Amplitude: 0.75 V
Lead Channel Pacing Threshold Pulse Width: 0.4 ms
Lead Channel Setting Sensing Sensitivity: 2 mV
MDC IDC LEAD LOCATION: 753860
MDC IDC MSMT BATTERY REMAINING LONGEVITY: 103 mo
MDC IDC MSMT BATTERY REMAINING PERCENTAGE: 81 %
MDC IDC MSMT LEADCHNL RA IMPEDANCE VALUE: 410 Ohm
MDC IDC MSMT LEADCHNL RA SENSING INTR AMPL: 4.1 mV
MDC IDC MSMT LEADCHNL RV PACING THRESHOLD PULSEWIDTH: 0.4 ms
MDC IDC MSMT LEADCHNL RV SENSING INTR AMPL: 12 mV
MDC IDC SET LEADCHNL RA PACING AMPLITUDE: 1.625
MDC IDC SET LEADCHNL RV PACING AMPLITUDE: 2.5 V
MDC IDC SET LEADCHNL RV PACING PULSEWIDTH: 0.4 ms
MDC IDC STAT BRADY AP VS PERCENT: 24 %
MDC IDC STAT BRADY AS VP PERCENT: 1 %
MDC IDC STAT BRADY AS VS PERCENT: 73 %
Pulse Gen Model: 2210
Pulse Gen Serial Number: 7241194

## 2016-07-23 ENCOUNTER — Telehealth: Payer: Self-pay | Admitting: Internal Medicine

## 2016-07-23 NOTE — Telephone Encounter (Signed)
° ° ° °  Lacie with [name] call to say they need a script faxed over to them for the below med    clonazePAM (KLONOPIN) 0.25 MG disintegrating tablet  Would like to know if Dr Raliegh Ip would like to keep PRN  FOR THE .025 MG      [phone]

## 2016-07-23 NOTE — Telephone Encounter (Signed)
rx printed and signed by MD; left up front for pick up

## 2016-07-30 ENCOUNTER — Telehealth: Payer: Self-pay | Admitting: Internal Medicine

## 2016-07-30 MED ORDER — CLONAZEPAM 0.25 MG PO TBDP: ORAL_TABLET | ORAL | 0 refills | Status: DC

## 2016-07-30 NOTE — Telephone Encounter (Signed)
Spoke with [name] [name] and faxed over the Rx as requested

## 2016-07-30 NOTE — Telephone Encounter (Signed)
° ° °  [name] [name] from [facility] call to say they need a script for prn for the below med  clonazePAM (KLONOPIN) 0.25 MG disintegrating tablet   [phone]

## 2016-08-16 ENCOUNTER — Ambulatory Visit (INDEPENDENT_AMBULATORY_CARE_PROVIDER_SITE_OTHER): Payer: Medicare Other | Admitting: Family Medicine

## 2016-08-16 ENCOUNTER — Encounter: Payer: Self-pay | Admitting: Family Medicine

## 2016-08-16 VITALS — BP 142/92 | HR 72 | Temp 99.0°F | Wt 173.8 lb

## 2016-08-16 DIAGNOSIS — J449 Chronic obstructive pulmonary disease, unspecified: Secondary | ICD-10-CM

## 2016-08-16 DIAGNOSIS — R350 Frequency of micturition: Secondary | ICD-10-CM

## 2016-08-16 DIAGNOSIS — F0391 Unspecified dementia with behavioral disturbance: Secondary | ICD-10-CM

## 2016-08-16 DIAGNOSIS — I442 Atrioventricular block, complete: Secondary | ICD-10-CM

## 2016-08-16 DIAGNOSIS — I10 Essential (primary) hypertension: Secondary | ICD-10-CM

## 2016-08-16 DIAGNOSIS — R413 Other amnesia: Secondary | ICD-10-CM

## 2016-08-16 LAB — POC URINALSYSI DIPSTICK (AUTOMATED)
BILIRUBIN UA: NEGATIVE
GLUCOSE UA: NEGATIVE
Ketones, UA: NEGATIVE
LEUKOCYTES UA: NEGATIVE
NITRITE UA: NEGATIVE
RBC UA: NEGATIVE
Spec Grav, UA: 1.015 (ref 1.010–1.025)
Urobilinogen, UA: 0.2 E.U./dL
pH, UA: 6 (ref 5.0–8.0)

## 2016-08-16 NOTE — Patient Instructions (Signed)
It was a pleasure to see you today. You will be contact about your referral.  Please let us know if you have not heard back within 1 week about your referral.   Please continue medications as currently prescribed and further evaluation will be completed through palliative care.

## 2016-08-16 NOTE — Progress Notes (Signed)
Subjective:    Patient ID: [name] [name], male    DOB: April 02, 1925, 83 y.o.   MRN: [id number]  HPI  Mr. Rodeheaver is an 83 year old male ambulating with a walker who is here today for a follow up of his chronic conditions and medication management. His wife reports that she has recently not been able to visit her husband in the assisted living facility due to her recent surgery however she has been going back for one week. She states that she has been overseeing his bathing, feeding, and taking his medications however there have been several days (she cannot report a specific number) that he has refused his meds. She is unsure if he has taken his medications today but will check with nurse at assisted living facility after this visit.  Today, wife reports that nursing staff at assisted living state that patient is refusing medications more often and appears agitated at times. PCP has recently refilled clonazepam 0.25 mg tablet BID that was requested by nursing staff and this has provided moderate benefit. PMH includes HTN, dyslipidemia, complete block status post pacemaker insertion, and remote history of PMR. He has a history of dementia with intermittent aggressive behavior.   Medication regimen includes seroquel and aricept with clonazepam which is prescribed BID for insomnia.  While history is challenging to obtain due to dementia, he and his wife deny that patient has chest pain, palpitations, SOB, dizziness, syncope, or near syncope.   Review of Systems  Constitutional: Positive for appetite change. Negative for chills.       Ambulates with walker; Low grade temp  Respiratory: Negative for cough, shortness of breath and wheezing.   Cardiovascular: Negative for chest pain and palpitations.  Gastrointestinal: Negative for abdominal pain, constipation, diarrhea, nausea and vomiting.  Endocrine: Negative for polyuria.  Genitourinary: Negative for dysuria and hematuria.  Musculoskeletal: Positive  for gait problem.  Skin: Negative for rash.  Neurological: Negative for dizziness, light-headedness and headaches.  Psychiatric/Behavioral: Positive for agitation, behavioral problems and confusion.   Past Medical History:  Diagnosis Date  . Arthritis   . Atrioventricular block, complete ([place])   . BENIGN PROSTATIC HYPERTROPHY 08/09/2006  . Cancer ([place])   . CEREBROVASCULAR ACCIDENT, HX OF 08/09/2006  . COLONIC POLYPS, HX OF 08/09/2006  . Complete heart block ([place]) 11/21/2008   intermittent  . Complication of anesthesia 02/08/11   "? 1966; I think he just quit breathing after Demerol"  . COPD 01/13/2009  . CORONARY ARTERY DISEASE 01/12/2008   Drug eluting stent Circumflex 2009;  LHC 02/08/11:  CFX stent patent, LVF normal, left ventricle "spade-shaped" consistent with hypertensive heart disease  . DEMENTIA   . GERD 08/09/2006  . HYPERLIPIDEMIA 08/09/2006  . HYPERTENSION, BENIGN 08/13/2008  . MELENA, HX OF 02/01/2008  . Pacemaker 08/09/2006   family denies this history  . Pacemaker    DOI 2012  . PHLEBITIS, LOWER EXTREMITY 06/08/2007  . Pneumonia 02/08/11   "walking pneumonia; several years ago"  . Polymyalgia rheumatica ([place]) 08/09/2006  . SYNCOPE 12/25/2008   Secondary heart block  . VENTRICULAR TACHYCARDIA 01/12/2008   Nonsustained  . WEAKNESS 11/21/2008  . Whooping cough    "as a child"     Social History   Social History  . Marital status: Married    Spouse name: N/A  . Number of children: N/A  . Years of education: N/A   Occupational History  . retired Retired   Social History Main Topics  . Smoking status:  Former Smoker    Packs/day: 1.00    Years: 71.00    Types: Cigarettes, Cigars    Quit date: 03/16/1991  . Smokeless tobacco: Never Used     Comment: Pt is smoking 2 cigars a day.  . Alcohol use Yes     Comment: "used to have glass of wine or beer occasionally; hasn't had one since ~ 2002"  . Drug use: No  . Sexual activity: No   Other Topics Concern  . Not on  file   Social History Narrative  . No narrative on file    Past Surgical History:  Procedure Laterality Date  . BACK SURGERY    . CAROTID ENDARTERECTOMY     left  . CATARACT EXTRACTION W/ INTRAOCULAR LENS  IMPLANT, BILATERAL  ~ 2000  . CORONARY ANGIOPLASTY WITH STENT PLACEMENT    . INSERT / REPLACE / REMOVE PACEMAKER  09/07/10   initial placement  . LEFT HEART CATHETERIZATION WITH CORONARY ANGIOGRAM N/A 02/08/2011   Procedure: LEFT HEART CATHETERIZATION WITH CORONARY ANGIOGRAM;  Surgeon: Thayer Headings, MD;  Location: [facility] CATH LAB;  Service: Cardiovascular;  Laterality: N/A;  . LUMBAR LAMINECTOMY    . TRANSURETHRAL RESECTION OF PROSTATE      Family History  Problem Relation Age of Onset  . Heart disease Father     Allergies  Allergen Reactions  . Atorvastatin Other (See Comments)    Unknown.  . Demerol [Meperidine]   . Meperidine Hcl Other (See Comments)    Unknown Patient wife states it "knocked him out" years ago      BP (!) 142/92   Pulse 72   Temp 99 F (37.2 C) (Oral)   Wt 173 lb 12.8 oz (78.8 kg)   SpO2 92%   BMI 26.82 kg/m       Objective:   Physical Exam  Constitutional:  Ambulating with walker  HENT:  Right Ear: Tympanic membrane normal.  Left Ear: Tympanic membrane normal.  Nose: No rhinorrhea.  Mouth/Throat: Oropharynx is clear and moist.  Eyes: Pupils are equal, round, and reactive to light. No scleral icterus.  Neck: Neck supple.  Cardiovascular: Normal rate, regular rhythm and intact distal pulses.   Pacemaker present  Pulmonary/Chest: Effort normal and breath sounds normal. He has no wheezes. He has no rales.  Abdominal: Soft. Bowel sounds are normal. There is no tenderness.  Lymphadenopathy:    He has no cervical adenopathy.  Neurological:  Oriented to person  Skin: Skin is warm and dry. No rash noted.  Psychiatric: He has a normal mood and affect. His behavior is normal.  Cognitive impairment       Assessment & Plan:  1.  Dementia with behavioral disturbance, unspecified dementia type Intermittent aggressive behavior and long term management concerns were discussed with wife and patient today. Wife is agreeable to a referral for palliative care evaluation and planning of care. Obtained a UA today to evaluate for possible infection due to cognitive changes. UA negative for leukocytes, blood, and nitrites today.  - Amb Referral to Palliative Care  2. Memory loss  3. COPD, moderate ([place])  4. HYPERTENSION, BENIGN Retake of BP is 142/92; wife will verify with nursing staff if patient has taken his medication as this is a concern.   5. Atrioventricular block, complete-intermittent  Patient declined lab work today and wife states that this can agitate him at times so she declined this also. Advised current medication regimen and referral to palliative care for evaluation and long  term management.  Delano Metz, FNP-C

## 2016-08-18 ENCOUNTER — Ambulatory Visit (INDEPENDENT_AMBULATORY_CARE_PROVIDER_SITE_OTHER): Payer: Medicare Other | Admitting: *Deleted

## 2016-08-18 ENCOUNTER — Telehealth: Payer: Self-pay | Admitting: Cardiology

## 2016-08-18 DIAGNOSIS — I442 Atrioventricular block, complete: Secondary | ICD-10-CM

## 2016-08-18 NOTE — Telephone Encounter (Signed)
LMOVM reminding pt to send remote transmission.   

## 2016-08-18 NOTE — Progress Notes (Signed)
Remote pacemaker transmission.   

## 2016-08-23 ENCOUNTER — Other Ambulatory Visit: Payer: Self-pay | Admitting: Family Medicine

## 2016-08-24 ENCOUNTER — Encounter: Payer: Self-pay | Admitting: Cardiology

## 2016-08-24 LAB — CUP PACEART REMOTE DEVICE CHECK
Battery Remaining Percentage: 81 %
Battery Voltage: 2.93 V
Brady Statistic AP VP Percent: 2.7 %
Brady Statistic AP VS Percent: 26 %
Brady Statistic AS VP Percent: 1 %
Brady Statistic AS VS Percent: 71 %
Brady Statistic RV Percent Paced: 3.3 %
Implantable Lead Implant Date: 20120625
Implantable Lead Implant Date: 20120625
Implantable Lead Location: 753860
Implantable Pulse Generator Implant Date: 20120625
Lead Channel Impedance Value: 390 Ohm
Lead Channel Impedance Value: 540 Ohm
Lead Channel Pacing Threshold Amplitude: 0.75 V
Lead Channel Pacing Threshold Amplitude: 0.75 V
Lead Channel Pacing Threshold Pulse Width: 0.4 ms
Lead Channel Pacing Threshold Pulse Width: 0.4 ms
Lead Channel Sensing Intrinsic Amplitude: 12 mV
Lead Channel Sensing Intrinsic Amplitude: 2.4 mV
Lead Channel Setting Pacing Amplitude: 1.75 V
Lead Channel Setting Pacing Amplitude: 2.5 V
Lead Channel Setting Pacing Pulse Width: 0.4 ms
Lead Channel Setting Sensing Sensitivity: 2 mV
MDC IDC LEAD LOCATION: 753859
MDC IDC MSMT BATTERY REMAINING LONGEVITY: 103 mo
MDC IDC PG SERIAL: 7241194
MDC IDC SESS DTM: 20180605233805
MDC IDC STAT BRADY RA PERCENT PACED: 27 %

## 2016-10-08 ENCOUNTER — Telehealth: Payer: Self-pay | Admitting: Internal Medicine

## 2016-10-08 NOTE — Telephone Encounter (Signed)
[facility] is calling stating that he has been speaking with the family and they have agreed that the pt need a referral to [facility] you can fax the order over to [facility] at [phone] (F) they would like to see if this could be done today.

## 2016-10-08 NOTE — Telephone Encounter (Signed)
Referral order has been faxed.

## 2016-10-17 ENCOUNTER — Telehealth: Payer: Self-pay | Admitting: Internal Medicine

## 2016-10-17 ENCOUNTER — Emergency Department (HOSPITAL_COMMUNITY)
Admission: EM | Admit: 2016-10-17 | Discharge: 2016-10-18 | Disposition: A | Discharge status: Home / Self Care | Attending: Emergency Medicine | Admitting: Emergency Medicine

## 2016-10-17 ENCOUNTER — Emergency Department (HOSPITAL_COMMUNITY)

## 2016-10-17 ENCOUNTER — Encounter (HOSPITAL_COMMUNITY): Payer: Self-pay | Admitting: Emergency Medicine

## 2016-10-17 DIAGNOSIS — J449 Chronic obstructive pulmonary disease, unspecified: Secondary | ICD-10-CM | POA: Diagnosis not present

## 2016-10-17 DIAGNOSIS — W1830XA Fall on same level, unspecified, initial encounter: Secondary | ICD-10-CM | POA: Insufficient documentation

## 2016-10-17 DIAGNOSIS — F0390 Unspecified dementia without behavioral disturbance: Secondary | ICD-10-CM | POA: Diagnosis not present

## 2016-10-17 DIAGNOSIS — Y999 Unspecified external cause status: Secondary | ICD-10-CM | POA: Diagnosis not present

## 2016-10-17 DIAGNOSIS — I10 Essential (primary) hypertension: Secondary | ICD-10-CM | POA: Insufficient documentation

## 2016-10-17 DIAGNOSIS — Y9301 Activity, walking, marching and hiking: Secondary | ICD-10-CM | POA: Insufficient documentation

## 2016-10-17 DIAGNOSIS — Y92129 Unspecified place in nursing home as the place of occurrence of the external cause: Secondary | ICD-10-CM | POA: Diagnosis not present

## 2016-10-17 DIAGNOSIS — Z87891 Personal history of nicotine dependence: Secondary | ICD-10-CM | POA: Diagnosis not present

## 2016-10-17 DIAGNOSIS — M25552 Pain in left hip: Secondary | ICD-10-CM | POA: Diagnosis not present

## 2016-10-17 DIAGNOSIS — W19XXXA Unspecified fall, initial encounter: Secondary | ICD-10-CM

## 2016-10-17 NOTE — ED Provider Notes (Signed)
[facility] DEPT Provider Note   CSN: [id number] Arrival date & time: 10/17/16  2208     History   Chief Complaint Chief Complaint  Patient presents with  . Fall  . Hip Pain    HPI [name] [name] is a 83 y.o. male.  HPI [name] [name] is a 83 y.o. male with multiple medical problems, on hospice, presents to emergency department after a mechanical fall. Patient was ambulating from the dining room to his room, when he tripped and fell backwards. According to the staff, patient did not hit his head. He is demented at baseline and unable to provide any history. He was complaining of left hip pain. No other apparent injuries reported. Patient is on aspirin 81mg  and plavix.   Past Medical History:  Diagnosis Date  . Arthritis   . Atrioventricular block, complete ([place])   . BENIGN PROSTATIC HYPERTROPHY 08/09/2006  . Cancer ([place])   . CEREBROVASCULAR ACCIDENT, HX OF 08/09/2006  . COLONIC POLYPS, HX OF 08/09/2006  . Complete heart block ([place]) 11/21/2008   intermittent  . Complication of anesthesia 02/08/11   "? 1966; I think he just quit breathing after Demerol"  . COPD 01/13/2009  . CORONARY ARTERY DISEASE 01/12/2008   Drug eluting stent Circumflex 2009;  LHC 02/08/11:  CFX stent patent, LVF normal, left ventricle "spade-shaped" consistent with hypertensive heart disease  . DEMENTIA   . GERD 08/09/2006  . HYPERLIPIDEMIA 08/09/2006  . HYPERTENSION, BENIGN 08/13/2008  . MELENA, HX OF 02/01/2008  . Pacemaker 08/09/2006   family denies this history  . Pacemaker    DOI 2012  . PHLEBITIS, LOWER EXTREMITY 06/08/2007  . Pneumonia 02/08/11   "walking pneumonia; several years ago"  . Polymyalgia rheumatica ([place]) 08/09/2006  . SYNCOPE 12/25/2008   Secondary heart block  . VENTRICULAR TACHYCARDIA 01/12/2008   Nonsustained  . WEAKNESS 11/21/2008  . Whooping cough    "as a child"    Patient Active Problem List   Diagnosis Date Noted  . Atrioventricular block, complete-intermittent   .  Pacemaker-St. Jude implanted 2012 12/24/2010  . COPD, moderate ([place]) 01/13/2009  . SYNCOPE 12/25/2008  . Smoker 12/23/2008  . ANEMIA, MILD 11/21/2008  . DYSPNEA 11/21/2008  . HYPERTENSION, BENIGN 08/13/2008  . Coronary atherosclerosis 01/12/2008  . VENTRICULAR TACHYCARDIA 01/12/2008  . PHLEBITIS, LOWER EXTREMITY 06/08/2007  . MEMORY LOSS 05/22/2007  . CEREBROVASCULAR DISEASE LATE EFFECTS, NOS 11/25/2006  . Dyslipidemia 08/09/2006  . GERD 08/09/2006  . BENIGN PROSTATIC HYPERTROPHY 08/09/2006  . POLYMYALGIA RHEUMATICA 08/09/2006    Past Surgical History:  Procedure Laterality Date  . BACK SURGERY    . CAROTID ENDARTERECTOMY     left  . CATARACT EXTRACTION W/ INTRAOCULAR LENS  IMPLANT, BILATERAL  ~ 2000  . CORONARY ANGIOPLASTY WITH STENT PLACEMENT    . INSERT / REPLACE / REMOVE PACEMAKER  09/07/10   initial placement  . LEFT HEART CATHETERIZATION WITH CORONARY ANGIOGRAM N/A 02/08/2011   Procedure: LEFT HEART CATHETERIZATION WITH CORONARY ANGIOGRAM;  Surgeon: Thayer Headings, MD;  Location: [facility] CATH LAB;  Service: Cardiovascular;  Laterality: N/A;  . LUMBAR LAMINECTOMY    . TRANSURETHRAL RESECTION OF PROSTATE         Home Medications     Family History Family History  Problem Relation Age of Onset  . Heart disease Father     Social History Social History  Substance Use Topics  . Smoking status: Former Smoker    Packs/day: 1.00    Years: 71.00  Types: Cigarettes, Cigars    Quit date: 03/16/1991  . Smokeless tobacco: Never Used     Comment: Pt is smoking 2 cigars a day.  . Alcohol use Yes     Comment: "used to have glass of wine or beer occasionally; hasn't had one since ~ 2002"     Allergies   Atorvastatin; Demerol [meperidine]; and Meperidine hcl   Review of Systems Review of Systems  Unable to perform ROS: Dementia  Musculoskeletal: Positive for arthralgias.     Physical Exam Updated Vital Signs BP (!) 147/87   Pulse 68   Temp 97.8 F (36.6 C)  (Oral)   Resp 18   SpO2 96%   Physical Exam  Constitutional: He appears well-developed and well-nourished. No distress.  HENT:  Head: Normocephalic and atraumatic.  Eyes: Conjunctivae are normal.  Neck: Neck supple.  No apparent cervical spine tenderness or deformity.  Cardiovascular: Normal rate and regular rhythm.   Murmur heard. Pulmonary/Chest: Effort normal. No respiratory distress. He has no wheezes. He has no rales.  Abdominal: Soft. Bowel sounds are normal. He exhibits no distension. There is no tenderness. There is no rebound.  Musculoskeletal: He exhibits no edema.  No deformity over thoracic or lumbar spine. Pelvis appears to be stable. Full range of motion of bilateral hips, knees, ankles, shoulders, elbows, wrists.  Neurological: He is alert.  Skin: Skin is warm and dry.  Nursing note and vitals reviewed.    ED Treatments / Results  Labs (all labs ordered are listed, but only abnormal results are displayed) Labs Reviewed - No data to display  EKG  EKG Interpretation None       Radiology No results found.  Procedures Procedures (including critical care time)  Medications Ordered in ED Medications - No data to display   Initial Impression / Assessment and Plan / ED Course  I have reviewed the triage vital signs and the nursing notes.  Pertinent labs & imaging results that were available during my care of the patient were reviewed by me and considered in my medical decision making (see chart for details).     Pt in ED after a mechanical fall. He is from SNF, he is on hospice. Pt was complaining of left hip pain, no other obvious injuries. Pt at baseline re his dementia. Will get left hip xray. Pt is on plavix and asa, but staff denied head trauma.    11:10 PM X-ray of the hip is negative. Patient was able to ambulate with a walker with no difficulty. Family at bedside, discussed plan of discharging back to the facility. Discussed with Dr. Dina Rich who  will go by and see patient.  Vitals:   10/17/16 2221  BP: (!) 147/87  Pulse: 68  Resp: 18  Temp: 97.8 F (36.6 C)  TempSrc: Oral  SpO2: 96%     Final Clinical Impressions(s) / ED Diagnoses   Final diagnoses:  Fall, initial encounter  Left hip pain    New Prescriptions New Prescriptions   No medications on file     Jeannett Senior, PA-C 10/17/16 2312    Merryl Hacker, MD 10/18/16 [phone]

## 2016-10-17 NOTE — ED Notes (Signed)
Bed: WA09 Expected date:  Expected time:  Means of arrival:  Comments: 11m fall/hip pain

## 2016-10-17 NOTE — Telephone Encounter (Signed)
PC from the service Hospice nurse Clifton James called ---patient has falled Trying to connect me--just got voice mail  Waited---no call back from the hospice nurse

## 2016-10-17 NOTE — ED Triage Notes (Signed)
Brought in by EMS from [facility] SNF with c/o left hip pain after his witnessed fall tonight.  Pt has had a mechanical fall---- was ambulating with his walker from the dining table when he tripped on his "own feet".  No s/s obvious deformity to affected extremity by EMS.  Pt is on his baseline mentation, per SNF staff.

## 2016-10-17 NOTE — ED Notes (Signed)
Pt ambulated well in the hall with a walker.

## 2016-10-17 NOTE — Discharge Instructions (Signed)
Left hip xray and pelvis negative. Pt ambulated with walker here, tolerated well. Follow up with primary care doctor as needed.

## 2016-10-17 NOTE — ED Notes (Signed)
[facility] was called for pt's transportation back to [facility].

## 2016-10-18 NOTE — ED Notes (Signed)
[facility] here to transport pt back to [facility].

## 2016-11-09 ENCOUNTER — Telehealth: Payer: Self-pay | Admitting: Internal Medicine

## 2016-11-09 NOTE — Telephone Encounter (Signed)
Sherita with hospice confirming that Mr. Chaves monitor needs to stay plugged in at all times. I confirmed that it does in order to transmit automatically and receive software updates. She verbalizes understanding. No inquiry about deactivating pacemaker.

## 2016-11-09 NOTE — Telephone Encounter (Signed)
No answer, no VM set up.    PPM should not need to be deactivated- it is not a life-sustaining measure.

## 2016-11-09 NOTE — Telephone Encounter (Signed)
New message      Pt is under the care of hospice for about 1 week.  Calling to see if his pacemaker needs to be turned off.

## 2016-11-17 ENCOUNTER — Telehealth: Payer: Self-pay | Admitting: Cardiology

## 2016-11-17 ENCOUNTER — Ambulatory Visit (INDEPENDENT_AMBULATORY_CARE_PROVIDER_SITE_OTHER): Admitting: *Deleted

## 2016-11-17 DIAGNOSIS — I442 Atrioventricular block, complete: Secondary | ICD-10-CM | POA: Diagnosis not present

## 2016-11-17 NOTE — Telephone Encounter (Signed)
Confirmed remote transmission w/ pt wife.   

## 2016-11-18 NOTE — Progress Notes (Signed)
Remote pacemaker transmission.   

## 2016-11-23 ENCOUNTER — Encounter: Payer: Self-pay | Admitting: Cardiology

## 2016-12-08 LAB — CUP PACEART REMOTE DEVICE CHECK
Battery Voltage: 2.93 V
Brady Statistic AP VP Percent: 2.5 %
Brady Statistic AP VS Percent: 25 %
Brady Statistic RA Percent Paced: 27 %
Brady Statistic RV Percent Paced: 3.2 %
Date Time Interrogation Session: 20180906060925
Implantable Lead Implant Date: 20120625
Implantable Lead Implant Date: 20120625
Implantable Lead Location: 753859
Implantable Pulse Generator Implant Date: 20120625
Lead Channel Impedance Value: 540 Ohm
Lead Channel Pacing Threshold Amplitude: 0.75 V
Lead Channel Pacing Threshold Pulse Width: 0.4 ms
Lead Channel Setting Sensing Sensitivity: 2 mV
MDC IDC LEAD LOCATION: 753860
MDC IDC MSMT BATTERY REMAINING LONGEVITY: 103 mo
MDC IDC MSMT BATTERY REMAINING PERCENTAGE: 81 %
MDC IDC MSMT LEADCHNL RA IMPEDANCE VALUE: 390 Ohm
MDC IDC MSMT LEADCHNL RA SENSING INTR AMPL: 3.4 mV
MDC IDC MSMT LEADCHNL RV PACING THRESHOLD AMPLITUDE: 0.75 V
MDC IDC MSMT LEADCHNL RV PACING THRESHOLD PULSEWIDTH: 0.4 ms
MDC IDC MSMT LEADCHNL RV SENSING INTR AMPL: 12 mV
MDC IDC SET LEADCHNL RA PACING AMPLITUDE: 1.75 V
MDC IDC SET LEADCHNL RV PACING AMPLITUDE: 2.5 V
MDC IDC SET LEADCHNL RV PACING PULSEWIDTH: 0.4 ms
MDC IDC STAT BRADY AS VP PERCENT: 1 %
MDC IDC STAT BRADY AS VS PERCENT: 71 %
Pulse Gen Model: 2210
Pulse Gen Serial Number: 7241194

## 2016-12-30 ENCOUNTER — Telehealth: Payer: Self-pay

## 2016-12-30 NOTE — Telephone Encounter (Signed)
Spoke with [employer] and advised. Nothing further needed.

## 2016-12-30 NOTE — Telephone Encounter (Signed)
Agree with proposed recommendations

## 2016-12-30 NOTE — Telephone Encounter (Signed)
Sherita @ Hospice called to advise that they have made some changes in medications and would like your approval. They have added Depakote 125mg  BID and would like to also add Risperdal 0.25mg  QD as pt is still combative and has a high level of anxiety.  Dr. Raliegh Ip - Please advise. Thanks!

## 2017-02-16 ENCOUNTER — Ambulatory Visit (INDEPENDENT_AMBULATORY_CARE_PROVIDER_SITE_OTHER): Admitting: *Deleted

## 2017-02-16 ENCOUNTER — Telehealth: Payer: Self-pay | Admitting: Cardiology

## 2017-02-16 DIAGNOSIS — I442 Atrioventricular block, complete: Secondary | ICD-10-CM | POA: Diagnosis not present

## 2017-02-16 NOTE — Telephone Encounter (Signed)
Confirmed remote transmission w/ pt wife.   

## 2017-02-17 ENCOUNTER — Encounter: Payer: Self-pay | Admitting: Cardiology

## 2017-02-17 NOTE — Progress Notes (Signed)
Remote pacemaker transmission.   

## 2017-02-25 LAB — CUP PACEART REMOTE DEVICE CHECK
Battery Remaining Longevity: 91 mo
Battery Remaining Percentage: 73 %
Brady Statistic AP VP Percent: 2.5 %
Brady Statistic AP VS Percent: 27 %
Brady Statistic RA Percent Paced: 28 %
Brady Statistic RV Percent Paced: 3.1 %
Implantable Lead Implant Date: 20120625
Implantable Lead Location: 753859
Lead Channel Impedance Value: 360 Ohm
Lead Channel Pacing Threshold Amplitude: 0.75 V
Lead Channel Pacing Threshold Amplitude: 0.75 V
Lead Channel Pacing Threshold Pulse Width: 0.4 ms
Lead Channel Sensing Intrinsic Amplitude: 1.1 mV
Lead Channel Setting Sensing Sensitivity: 2 mV
MDC IDC LEAD IMPLANT DT: 20120625
MDC IDC LEAD LOCATION: 753860
MDC IDC MSMT BATTERY VOLTAGE: 2.92 V
MDC IDC MSMT LEADCHNL RV IMPEDANCE VALUE: 490 Ohm
MDC IDC MSMT LEADCHNL RV PACING THRESHOLD PULSEWIDTH: 0.4 ms
MDC IDC MSMT LEADCHNL RV SENSING INTR AMPL: 11.3 mV
MDC IDC PG IMPLANT DT: 20120625
MDC IDC PG SERIAL: 7241194
MDC IDC SESS DTM: 20181205213001
MDC IDC SET LEADCHNL RA PACING AMPLITUDE: 1.75 V
MDC IDC SET LEADCHNL RV PACING AMPLITUDE: 2.5 V
MDC IDC SET LEADCHNL RV PACING PULSEWIDTH: 0.4 ms
MDC IDC STAT BRADY AS VP PERCENT: 1 %
MDC IDC STAT BRADY AS VS PERCENT: 70 %

## 2017-04-15 DEATH — deceased
